# Patient Record
Sex: Female | Born: 1960 | Race: White | Hispanic: No | Marital: Married | Smoking: Never smoker
Health system: Southern US, Community
[De-identification: ages and names within clinical notes are randomized; demographics above are authoritative.]

## PROBLEM LIST (undated history)

## (undated) DIAGNOSIS — Z923 Personal history of irradiation: Secondary | ICD-10-CM

## (undated) DIAGNOSIS — R0602 Shortness of breath: Secondary | ICD-10-CM

## (undated) DIAGNOSIS — F419 Anxiety disorder, unspecified: Secondary | ICD-10-CM

## (undated) DIAGNOSIS — I10 Essential (primary) hypertension: Secondary | ICD-10-CM

## (undated) DIAGNOSIS — F32A Depression, unspecified: Secondary | ICD-10-CM

## (undated) DIAGNOSIS — F329 Major depressive disorder, single episode, unspecified: Secondary | ICD-10-CM

## (undated) DIAGNOSIS — C50919 Malignant neoplasm of unspecified site of unspecified female breast: Secondary | ICD-10-CM

## (undated) DIAGNOSIS — R51 Headache: Secondary | ICD-10-CM

## (undated) DIAGNOSIS — G4730 Sleep apnea, unspecified: Secondary | ICD-10-CM

## (undated) HISTORY — DX: Sleep apnea, unspecified: G47.30

## (undated) HISTORY — DX: Personal history of irradiation: Z92.3

## (undated) HISTORY — DX: Depression, unspecified: F32.A

## (undated) HISTORY — DX: Essential (primary) hypertension: I10

## (undated) HISTORY — DX: Major depressive disorder, single episode, unspecified: F32.9

## (undated) HISTORY — DX: Anxiety disorder, unspecified: F41.9

## (undated) HISTORY — DX: Malignant neoplasm of unspecified site of unspecified female breast: C50.919

## (undated) HISTORY — DX: Headache: R51

---

## 2005-10-26 ENCOUNTER — Ambulatory Visit (HOSPITAL_BASED_OUTPATIENT_CLINIC_OR_DEPARTMENT_OTHER): Admission: RE | Admit: 2005-10-26 | Discharge: 2005-10-26 | Payer: Self-pay

## 2005-10-29 ENCOUNTER — Ambulatory Visit: Payer: Self-pay | Admitting: Internal Medicine

## 2005-12-15 ENCOUNTER — Ambulatory Visit: Payer: Self-pay | Admitting: Internal Medicine

## 2006-01-12 ENCOUNTER — Ambulatory Visit: Payer: Self-pay | Admitting: Internal Medicine

## 2006-06-22 ENCOUNTER — Ambulatory Visit: Payer: Self-pay | Admitting: Internal Medicine

## 2006-08-24 ENCOUNTER — Ambulatory Visit: Payer: Self-pay

## 2008-08-28 ENCOUNTER — Ambulatory Visit: Payer: Self-pay

## 2009-02-21 ENCOUNTER — Emergency Department (HOSPITAL_COMMUNITY): Admission: EM | Admit: 2009-02-21 | Discharge: 2009-02-21 | Payer: Self-pay | Admitting: Emergency Medicine

## 2011-01-26 ENCOUNTER — Telehealth: Payer: Self-pay | Admitting: Internal Medicine

## 2011-02-02 NOTE — Progress Notes (Signed)
Summary: cpap rx-  Phone Note Call from Patient Call back at Work Phone [phone] Call back at [phone]   Caller: Patient Call For: young Summary of Call: Pt states she wants a copy of her rx for her cpap mask pls advise. Initial call taken by: Darletta Moll,  January 26, 2011 12:36 PM  Follow-up for Phone Call        [facility] Vernie Murders  January 26, 2011 2:41 PM  spoke to pt and advsied she was last seen in 2007 so she will need and appt to get new rx for cpap. Pt set to see CY on 03-10-11. Carron Curie CMA  January 27, 2011 12:34 PM

## 2011-03-09 LAB — URINE MICROSCOPIC-ADD ON

## 2011-03-09 LAB — URINALYSIS, ROUTINE W REFLEX MICROSCOPIC
Bilirubin Urine: NEGATIVE
Ketones, ur: NEGATIVE mg/dL
Nitrite: NEGATIVE
Protein, ur: NEGATIVE mg/dL
Urobilinogen, UA: 0.2 mg/dL (ref 0.0–1.0)

## 2011-03-10 ENCOUNTER — Other Ambulatory Visit (HOSPITAL_COMMUNITY): Payer: Self-pay | Admitting: Family Medicine

## 2011-03-10 ENCOUNTER — Ambulatory Visit (HOSPITAL_COMMUNITY)
Admission: RE | Admit: 2011-03-10 | Discharge: 2011-03-10 | Disposition: A | Discharge status: Home / Self Care | Payer: PRIVATE HEALTH INSURANCE | Source: Ambulatory Visit

## 2011-03-10 ENCOUNTER — Institutional Professional Consult (permissible substitution): Payer: Self-pay | Admitting: Internal Medicine

## 2011-03-10 DIAGNOSIS — M25569 Pain in unspecified knee: Secondary | ICD-10-CM | POA: Insufficient documentation

## 2011-03-10 DIAGNOSIS — R52 Pain, unspecified: Secondary | ICD-10-CM

## 2011-03-10 DIAGNOSIS — IMO0002 Reserved for concepts with insufficient information to code with codable children: Secondary | ICD-10-CM | POA: Insufficient documentation

## 2011-03-10 DIAGNOSIS — M1710 Unilateral primary osteoarthritis, unspecified knee: Secondary | ICD-10-CM | POA: Insufficient documentation

## 2011-04-12 ENCOUNTER — Emergency Department (HOSPITAL_COMMUNITY): Payer: PRIVATE HEALTH INSURANCE

## 2011-04-12 ENCOUNTER — Emergency Department (HOSPITAL_COMMUNITY)
Admission: EM | Admit: 2011-04-12 | Discharge: 2011-04-12 | Disposition: A | Discharge status: Home / Self Care | Payer: PRIVATE HEALTH INSURANCE | Attending: Emergency Medicine | Admitting: Emergency Medicine

## 2011-04-12 DIAGNOSIS — M25569 Pain in unspecified knee: Secondary | ICD-10-CM | POA: Insufficient documentation

## 2011-04-12 DIAGNOSIS — F411 Generalized anxiety disorder: Secondary | ICD-10-CM | POA: Insufficient documentation

## 2011-04-12 DIAGNOSIS — IMO0002 Reserved for concepts with insufficient information to code with codable children: Secondary | ICD-10-CM | POA: Insufficient documentation

## 2011-04-12 DIAGNOSIS — F329 Major depressive disorder, single episode, unspecified: Secondary | ICD-10-CM | POA: Insufficient documentation

## 2011-04-12 DIAGNOSIS — F3289 Other specified depressive episodes: Secondary | ICD-10-CM | POA: Insufficient documentation

## 2011-04-12 DIAGNOSIS — Z79899 Other long term (current) drug therapy: Secondary | ICD-10-CM | POA: Insufficient documentation

## 2011-04-12 DIAGNOSIS — M539 Dorsopathy, unspecified: Secondary | ICD-10-CM | POA: Insufficient documentation

## 2011-04-14 NOTE — Assessment & Plan Note (Signed)
[facility]                               PULMONARY OFFICE NOTE   NAME:[name], [name] RITTENHOUSE                         MRN:          [id number]  DATE:06/22/2006                            DOB:          1961-08-27    Pulmonary sleep followup.   PROBLEM:  1.  Obstructive sleep apnea.  2.  Rhinitis.  3.  Exogenous obesity.  4.  Smoking, non-tobacco.  5.  Hypertension.   HISTORY:  Says she is using CPAP regularly at 14 CWP through [facility],  loves the machine with much less daytime sleepiness and no particular  discomforts.  She has got motivation to stick with a diet and has been  losing some weight.  She asked her daughter to step out of the room so that  she could tell me that she still is regularly smoking marijuana and asking  help to come off of that.  I have discussed smoking cessation in general.  I  have offered to see if Chantix would do anything.   MEDICATIONS:  1.  Lexapro 10 mg.  2.  Sular 20 mg  3.  Birth control pills.  4.  Triamterine/HCTZ.  5.  Xanax 0.25 mg t.i.d.  6.  CPAP at 14 CWP.   OBJECTIVE:  VITAL SIGNS:  Weight 215 pounds.  Blood pressure 128/72.  Pulse  regular 71.  Room air saturation 99%.  GENERAL:  Pleasantly alert and appropriate.  HEENT:  Throat clear with some watery sniffing but no post nasal drainage,  no cough and no pharyngeal erythema.  LUNGS:  Lung fields clear to percussion and auscultation.  HEART:  Heart sounds regular without murmur or gallop.  EXTREMITIES:  I find no adenopathy, no edema.   Recorded weight is down to 215 pounds from 242 pounds in February.   IMPRESSION:  1.  Obstructive sleep apnea.  2.  Substance abuse.   PLAN:  1.  Counseling done.  2.  Try Chantix.  3.  Continue weight loss.  4.  We are going to try a pressure change, [facility] to reduce her pressure to      13 CWP because of her weight loss to see how she feels with it.  5.  Schedule return in six months, earlier as needed.                                   Clinton D. Maple Hudson, MD, FCCP, FACP   CDY/MedQ  DD:  06/25/2006  DT:  06/25/2006  Job #:  [id number]   cc:   [name] [name]

## 2011-04-14 NOTE — Procedures (Signed)
NAMEROSEALIE, REACH                  ACCOUNT NO.:  [id number]   MEDICAL RECORD NO.:  [id number]          PATIENT TYPE:  OUT   LOCATION:  SLEEP CENTER                 FACILITY[facility]  [facility]   PHYSICIAN:  Clinton D. Maple Hudson, M.D. DATE OF BIRTH:  03-28-1961   DATE OF STUDY:                              NOCTURNAL POLYSOMNOGRAM   REFERRING PHYSICIAN:  Dr. Loma Sender.   DATE OF STUDY:  October 26, 2005.   INDICATION FOR STUDY:  Hypersomnia with sleep apnea.   EPWORTH SLEEPINESS SCORE:  20/24.   BMI:  43.   WEIGHT:  240 pounds.   SLEEP ARCHITECTURE:  Total sleep time 415 minutes with sleep efficiency 93%.  Stage I 1%, stage II 67%, stages III and IV 16%, REM 16% of total sleep  time. Sleep latency 11 minutes, REM latency 170 minutes, awake after sleep  onset 20 minutes, arousal index increased at 58. No bedtime medication was  reported.   RESPIRATORY DATA:  Split study protocol:  Apnea/hypopnea index (AHI, RDI)  142 obstructive events per hour indicating very severe obstructive sleep  apnea/hypopnea syndrome before C-PAP. This included 142 obstructive apneas,  6 central apneas and 132 hypopneas before C-PAP. Events were not positional.  REM AHI 1.8 per hour. C-PAP was titrated to 14 CWP, AHI  5.1 per hour. A small ComfortGel mask was used with heated humidifier. At 15  CWP, all events were eliminated but she began oral venting and the  technician felt 14 CWP would be sufficient.   OXYGEN DATA:  Loud snoring with oxygen desaturation to a nadir of 73% before  C-PAP. After C-PAP control, snoring was eliminated and oxygen saturation  held 94-98% on room air.   CARDIAC DATA:  Normal sinus rhythm with PVCs.   MOVEMENT/PARASOMNIA:  Some restlessness and frequent position changing but  no significant limb movement or unusual behavior.   IMPRESSION/RECOMMENDATIONS:  1.  Very severe obstructive sleep apnea/hypopnea syndrome, AHI 98.5 per hour      with loud snoring and oxygen desaturation  to 93%.  2.  Successful C-PAP titration to 14 CWP, AHI 5.1 per hour. A small      ComfortGel mask was used with heated humidifier.      Clinton D. Maple Hudson, M.D.  Diplomate, Biomedical engineer of Sleep Medicine  Electronically Signed     CDY/MEDQ  D:  10/29/2005 12:01:22  T:  10/29/2005 23:13:20  Job:  [id number]

## 2011-04-18 ENCOUNTER — Other Ambulatory Visit: Payer: Self-pay | Admitting: Orthopedic Surgery

## 2011-04-18 DIAGNOSIS — M25562 Pain in left knee: Secondary | ICD-10-CM

## 2011-04-19 ENCOUNTER — Ambulatory Visit
Admission: RE | Admit: 2011-04-19 | Discharge: 2011-04-19 | Disposition: A | Discharge status: Home / Self Care | Payer: PRIVATE HEALTH INSURANCE | Source: Ambulatory Visit | Attending: Orthopedic Surgery | Admitting: Orthopedic Surgery

## 2011-04-19 DIAGNOSIS — M25562 Pain in left knee: Secondary | ICD-10-CM

## 2011-09-15 ENCOUNTER — Ambulatory Visit
Admission: RE | Admit: 2011-09-15 | Discharge: 2011-09-15 | Disposition: A | Discharge status: Home / Self Care | Payer: PRIVATE HEALTH INSURANCE | Source: Ambulatory Visit | Attending: Orthopedic Surgery | Admitting: Orthopedic Surgery

## 2011-09-15 ENCOUNTER — Other Ambulatory Visit: Payer: Self-pay | Admitting: Orthopedic Surgery

## 2011-09-15 DIAGNOSIS — M549 Dorsalgia, unspecified: Secondary | ICD-10-CM

## 2011-09-15 MED ORDER — IOHEXOL 180 MG/ML  SOLN
1.0000 mL | Freq: Once | INTRAMUSCULAR | Status: AC | PRN
  Administered 2011-09-15: 1 mL via EPIDURAL

## 2011-09-15 MED ORDER — METHYLPREDNISOLONE ACETATE 40 MG/ML INJ SUSP (RADIOLOG
120.0000 mg | Freq: Once | INTRAMUSCULAR | Status: AC
  Administered 2011-09-15: 120 mg via EPIDURAL

## 2011-10-25 ENCOUNTER — Telehealth: Payer: Self-pay | Admitting: Internal Medicine

## 2011-10-25 NOTE — Telephone Encounter (Signed)
Pt last seen 06/22/2006 and will need an OV. This will be need to be scheduled as a new consult. LMOMTCB x 1 for Missy @ [facility].

## 2011-10-26 NOTE — Telephone Encounter (Signed)
Spoke with Missy and informed her that pt would need ov with Dr young since pt has not been seen since 2007.  Missy stated that she would let pt know and f/u with [place] after pt's visit.

## 2011-11-02 ENCOUNTER — Telehealth: Payer: Self-pay | Admitting: Internal Medicine

## 2011-11-02 NOTE — Telephone Encounter (Signed)
Spoke with pt. She states that she is needing to have sooner appt to re-establish with CDY- currently sched for 11/30/10 and her CPAP has been broken x 3 wks. No appts available before 12/01/11. Can we squeeze her in or maybe send new rx to DME for CPAP machine until she can get in for appt. Please advise, thanks!

## 2011-11-02 NOTE — Telephone Encounter (Signed)
lmomtcb for pt 

## 2011-11-03 NOTE — Telephone Encounter (Signed)
LMTCB and ask for me-can offer 11-15-2011 at 1a15am

## 2011-11-03 NOTE — Telephone Encounter (Signed)
[name] [name]- see what we can offer her.

## 2011-11-06 NOTE — Telephone Encounter (Signed)
I spoke with patient-she is aware of appointment on Friday 11-10-2011 at 1115 am with CY.

## 2011-11-06 NOTE — Telephone Encounter (Signed)
Pt returned [name]'s call & asked to be reached at [phone].  [name] [name]

## 2011-11-10 ENCOUNTER — Ambulatory Visit (INDEPENDENT_AMBULATORY_CARE_PROVIDER_SITE_OTHER): Payer: PRIVATE HEALTH INSURANCE | Admitting: Internal Medicine

## 2011-11-10 ENCOUNTER — Encounter: Payer: Self-pay | Admitting: Internal Medicine

## 2011-11-10 VITALS — BP 120/82 | HR 90 | Ht 62.0 in | Wt 222.6 lb

## 2011-11-10 DIAGNOSIS — G4733 Obstructive sleep apnea (adult) (pediatric): Secondary | ICD-10-CM

## 2011-11-10 NOTE — Progress Notes (Signed)
11/10/11- 50 yoF never smoker coming to reestablish for care of sleep apnea. Last here in 2007, then lost to f/u. PCP Dr Loma Sender NPSG 10/26/2005- severe OSA w/ AHI 98.5/ hr, weight 240 lbs Her CPAP machine failed and she has been without it for 2 months because of which she says she gets hardly any sleep at all. Her CPAP pressure was 13 and comfortable. Mother and daughter both have sleep apnea. Bedtime between 9 and 10 PM, estimated sleep latency 2 minutes, waking anywhere from 2-8 times per night before finally up at 6:30 AM. Weight has gone up 10 pounds since original testing. Medical problems include hypertension. She denies ENT surgery. 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  ROS-see History of present illness Constitutional:   No-   weight loss, night sweats, fevers, chills, fatigue, lassitude. HEENT:   No-  headaches, difficulty swallowing, tooth/dental problems, sore throat,       No-  sneezing, itching, ear ache, nasal congestion, post nasal drip,  CV:  No-   chest pain, orthopnea, PND, swelling in lower extremities, anasarca,                                  dizziness, palpitations Resp: No-   shortness of breath with exertion or at rest.              No-   productive cough,  No non-productive cough,  No- coughing up of blood.              No-   change in color of mucus.  No- wheezing.   Skin: No-   rash or lesions. GI:  No-   heartburn, indigestion, abdominal pain, nausea, vomiting, diarrhea,                 change in bowel habits, loss of appetite GU: MS:  No-   joint pain or swelling.  No- decreased range of motion.  No- back pain. Neuro-     nothing unusual Psych:  No- change in mood or affect. No depression or anxiety.  No memory loss.  OBJ General- Alert, Oriented, Affect-appropriate, Distress- none acute, alert, obese Skin- rash-none, lesions- none, excoriation- none Lymphadenopathy- none Head- atraumatic            Eyes- Gross vision intact, PERRLA,  conjunctivae clear secretions            Ears- Hearing, canals-normal            Nose- Clear, no-Septal dev, mucus, polyps, erosion, perforation             Throat- Mallampati III , mucosa clear , drainage- none, tonsils-not large Neck- flexible , trachea midline, no stridor , thyroid nl, carotid no bruit Chest - symmetrical excursion , unlabored           Heart/CV- RRR , no murmur , no gallop  , no rub, nl s1 s2                           - JVD- none , edema- none, stasis changes- none, varices- none           Lung- clear to P&A, wheeze- none, cough- none , dullness-none, rub- none           Chest wall-  Abd- tender-no, distended-no, bowel sounds-present, HSM- no Br/ Gen/ Rectal- Not done,  not indicated Extrem- cyanosis- none, clubbing, none, atrophy- none, strength- nl Neuro- grossly intact to observation

## 2011-11-10 NOTE — Patient Instructions (Signed)
[facility], humidifier, mask of choice and supplies   Dx OSA

## 2011-11-12 DIAGNOSIS — G4733 Obstructive sleep apnea (adult) (pediatric): Secondary | ICD-10-CM | POA: Insufficient documentation

## 2011-11-12 NOTE — Assessment & Plan Note (Signed)
Compliance and control have been very good with CPAP and 13 CWP seems to have been a good pressure setting. Her old machine has failed and she needs replacement. She is also interested in changing home care companies. Plan-replacement CPAP 13 cwp with mask, humidifier and supplies. Alternative therapies, weight loss, driving safety.

## 2011-12-01 ENCOUNTER — Institutional Professional Consult (permissible substitution): Payer: PRIVATE HEALTH INSURANCE | Admitting: Internal Medicine

## 2012-01-22 ENCOUNTER — Other Ambulatory Visit: Payer: Self-pay | Admitting: Orthopedic Surgery

## 2012-01-22 DIAGNOSIS — M549 Dorsalgia, unspecified: Secondary | ICD-10-CM

## 2012-01-23 ENCOUNTER — Ambulatory Visit
Admission: RE | Admit: 2012-01-23 | Discharge: 2012-01-23 | Disposition: A | Discharge status: Home / Self Care | Payer: PRIVATE HEALTH INSURANCE | Source: Ambulatory Visit | Attending: Orthopedic Surgery | Admitting: Orthopedic Surgery

## 2012-01-23 DIAGNOSIS — M549 Dorsalgia, unspecified: Secondary | ICD-10-CM

## 2012-01-23 NOTE — Progress Notes (Signed)
Cold pack to back post injection. Taking po's well.

## 2012-03-25 ENCOUNTER — Encounter: Payer: Self-pay | Admitting: Internal Medicine

## 2012-06-07 ENCOUNTER — Other Ambulatory Visit: Payer: Self-pay | Admitting: Orthopedic Surgery

## 2012-06-07 DIAGNOSIS — M549 Dorsalgia, unspecified: Secondary | ICD-10-CM

## 2012-06-19 ENCOUNTER — Ambulatory Visit
Admission: RE | Admit: 2012-06-19 | Discharge: 2012-06-19 | Disposition: A | Discharge status: Home / Self Care | Payer: PRIVATE HEALTH INSURANCE | Source: Ambulatory Visit | Attending: Orthopedic Surgery | Admitting: Orthopedic Surgery

## 2012-06-19 VITALS — BP 157/74 | HR 67

## 2012-06-19 DIAGNOSIS — M5126 Other intervertebral disc displacement, lumbar region: Secondary | ICD-10-CM

## 2012-06-19 DIAGNOSIS — M549 Dorsalgia, unspecified: Secondary | ICD-10-CM

## 2012-06-19 MED ORDER — IOHEXOL 180 MG/ML  SOLN
1.0000 mL | Freq: Once | INTRAMUSCULAR | Status: AC | PRN
  Administered 2012-06-19: 1 mL via EPIDURAL

## 2012-06-19 MED ORDER — METHYLPREDNISOLONE ACETATE 40 MG/ML INJ SUSP (RADIOLOG
120.0000 mg | Freq: Once | INTRAMUSCULAR | Status: AC
  Administered 2012-06-19: 120 mg via EPIDURAL

## 2012-08-08 ENCOUNTER — Other Ambulatory Visit: Payer: Self-pay | Admitting: Orthopedic Surgery

## 2012-08-08 DIAGNOSIS — M25561 Pain in right knee: Secondary | ICD-10-CM

## 2012-08-11 ENCOUNTER — Ambulatory Visit
Admission: RE | Admit: 2012-08-11 | Discharge: 2012-08-11 | Disposition: A | Discharge status: Home / Self Care | Payer: PRIVATE HEALTH INSURANCE | Source: Ambulatory Visit | Attending: Orthopedic Surgery | Admitting: Orthopedic Surgery

## 2012-08-11 DIAGNOSIS — M25561 Pain in right knee: Secondary | ICD-10-CM

## 2012-08-23 ENCOUNTER — Encounter (HOSPITAL_COMMUNITY): Payer: Self-pay | Admitting: Family Medicine

## 2012-08-23 ENCOUNTER — Emergency Department (HOSPITAL_COMMUNITY)
Admission: EM | Admit: 2012-08-23 | Discharge: 2012-08-24 | Disposition: A | Discharge status: Home / Self Care | Payer: PRIVATE HEALTH INSURANCE | Attending: Emergency Medicine | Admitting: Emergency Medicine

## 2012-08-23 DIAGNOSIS — R51 Headache: Secondary | ICD-10-CM | POA: Insufficient documentation

## 2012-08-23 DIAGNOSIS — R300 Dysuria: Secondary | ICD-10-CM | POA: Insufficient documentation

## 2012-08-23 DIAGNOSIS — R112 Nausea with vomiting, unspecified: Secondary | ICD-10-CM | POA: Insufficient documentation

## 2012-08-23 DIAGNOSIS — Z79899 Other long term (current) drug therapy: Secondary | ICD-10-CM | POA: Insufficient documentation

## 2012-08-23 DIAGNOSIS — R350 Frequency of micturition: Secondary | ICD-10-CM | POA: Insufficient documentation

## 2012-08-23 DIAGNOSIS — R509 Fever, unspecified: Secondary | ICD-10-CM | POA: Insufficient documentation

## 2012-08-23 DIAGNOSIS — I10 Essential (primary) hypertension: Secondary | ICD-10-CM | POA: Insufficient documentation

## 2012-08-23 DIAGNOSIS — R3915 Urgency of urination: Secondary | ICD-10-CM | POA: Insufficient documentation

## 2012-08-23 DIAGNOSIS — N12 Tubulo-interstitial nephritis, not specified as acute or chronic: Secondary | ICD-10-CM | POA: Insufficient documentation

## 2012-08-23 DIAGNOSIS — R1033 Periumbilical pain: Secondary | ICD-10-CM | POA: Insufficient documentation

## 2012-08-23 DIAGNOSIS — R5381 Other malaise: Secondary | ICD-10-CM | POA: Insufficient documentation

## 2012-08-23 LAB — CBC WITH DIFFERENTIAL/PLATELET
Basophils Absolute: 0 10*3/uL (ref 0.0–0.1)
Eosinophils Absolute: 0.1 10*3/uL (ref 0.0–0.7)
Eosinophils Relative: 1 % (ref 0–5)
MCH: 30.1 pg (ref 26.0–34.0)
MCHC: 34.5 g/dL (ref 30.0–36.0)
MCV: 87.3 fL (ref 78.0–100.0)
Platelets: 406 10*3/uL — ABNORMAL HIGH (ref 150–400)
RDW: 12.6 % (ref 11.5–15.5)

## 2012-08-23 LAB — COMPREHENSIVE METABOLIC PANEL
ALT: 17 U/L (ref 0–35)
AST: 15 U/L (ref 0–37)
Calcium: 9.6 mg/dL (ref 8.4–10.5)
GFR calc Af Amer: 89 mL/min — ABNORMAL LOW (ref >90)
Sodium: 135 mEq/L (ref 135–145)
Total Protein: 7.8 g/dL (ref 6.0–8.3)

## 2012-08-23 LAB — URINALYSIS, ROUTINE W REFLEX MICROSCOPIC
Bilirubin Urine: NEGATIVE
Protein, ur: 100 mg/dL — AB
Urobilinogen, UA: 1 mg/dL (ref 0.0–1.0)

## 2012-08-23 LAB — URINE MICROSCOPIC-ADD ON

## 2012-08-23 MED ORDER — ONDANSETRON HCL 4 MG/2ML IJ SOLN
4.0000 mg | Freq: Once | INTRAMUSCULAR | Status: AC
  Administered 2012-08-23: 4 mg via INTRAVENOUS
  Filled 2012-08-23: qty 2

## 2012-08-23 MED ORDER — MORPHINE SULFATE 4 MG/ML IJ SOLN
4.0000 mg | Freq: Once | INTRAMUSCULAR | Status: AC
  Administered 2012-08-23: 4 mg via INTRAVENOUS
  Filled 2012-08-23: qty 1

## 2012-08-23 MED ORDER — SODIUM CHLORIDE 0.9 % IV BOLUS (SEPSIS)
1000.0000 mL | Freq: Once | INTRAVENOUS | Status: AC
  Administered 2012-08-23: 1000 mL via INTRAVENOUS

## 2012-08-23 MED ORDER — IBUPROFEN 800 MG PO TABS: 800.0000 mg | ORAL_TABLET | Freq: Once | ORAL | Status: DC

## 2012-08-23 MED ORDER — POTASSIUM CHLORIDE CRYS ER 20 MEQ PO TBCR
40.0000 meq | EXTENDED_RELEASE_TABLET | Freq: Once | ORAL | Status: AC
  Administered 2012-08-24: 40 meq via ORAL
  Filled 2012-08-23: qty 2

## 2012-08-23 MED ORDER — ACETAMINOPHEN 325 MG PO TABS
650.0000 mg | ORAL_TABLET | Freq: Once | ORAL | Status: AC
  Administered 2012-08-23: 650 mg via ORAL
  Filled 2012-08-23: qty 2

## 2012-08-23 MED ORDER — POTASSIUM CHLORIDE CRYS ER 20 MEQ PO TBCR
40.0000 meq | EXTENDED_RELEASE_TABLET | Freq: Once | ORAL | Status: AC
  Administered 2012-08-24: 40 meq via ORAL

## 2012-08-23 MED ORDER — ACETAMINOPHEN 325 MG PO TABS
ORAL_TABLET | ORAL | Status: AC
  Filled 2012-08-23: qty 2

## 2012-08-23 MED ORDER — ACETAMINOPHEN 325 MG PO TABS: 650.0000 mg | ORAL_TABLET | Freq: Once | ORAL | Status: DC

## 2012-08-23 NOTE — ED Provider Notes (Signed)
History     CSN: [id number]  Arrival date & time 08/23/12  1459   First MD Initiated Contact with Patient 08/23/12 1926      Chief Complaint  Patient presents with  . Urinary Frequency  . Fever    (Consider location/radiation/quality/duration/timing/severity/associated sxs/prior treatment) Patient is a 51 y.o. female presenting with abdominal pain. The history is provided by the patient.  Abdominal Pain The primary symptoms of the illness include abdominal pain, fever, fatigue, nausea, vomiting and dysuria. The primary symptoms of the illness do not include shortness of breath or diarrhea. Episode onset: 1.5 wks. The onset of the illness was gradual. The problem has been gradually worsening.  The abdominal pain began more than 2 days ago. The pain came on gradually. The abdominal pain has been gradually worsening since its onset. The abdominal pain is located in the periumbilical region.  The fever began 3 to 5 days ago. The fever has been resolved since its onset. The maximum temperature recorded prior to her arrival was 102 to 102.9 F. The temperature was taken by an oral thermometer.  The dysuria began more than 1 week ago. The discomfort is mild. The dysuria is associated with frequency and urgency. The dysuria is not associated with discharge.  Additional symptoms associated with the illness include urgency and frequency.    Past Medical History  Diagnosis Date  . Hypertension   . Sleep apnea     History reviewed. No pertinent past surgical history.  History reviewed. No pertinent family history.  History  Substance Use Topics  . Smoking status: Never Smoker   . Smokeless tobacco: Not on file  . Alcohol Use: No    OB History    Grav Para Term Preterm Abortions TAB SAB Ect Mult Living                  Review of Systems  Constitutional: Positive for fever and fatigue.  Respiratory: Negative for cough and shortness of breath.   Cardiovascular: Negative for chest  pain.  Gastrointestinal: Positive for nausea, vomiting and abdominal pain. Negative for diarrhea.  Genitourinary: Positive for dysuria, urgency and frequency.  Neurological: Positive for headaches. Negative for weakness.  All other systems reviewed and are negative.    Allergies  Codeine; Penicillins; Percocet; and Septra  Home Medications   Current Outpatient Rx  Name Route Sig Dispense Refill  . ALPRAZOLAM 0.25 MG PO TABS Oral Take 1 tablet by mouth Three times a day.    Marland Kitchen ENPRESSE-28 PO TABS  Take as directed    . ESCITALOPRAM OXALATE 10 MG PO TABS Oral Take 1 tablet by mouth daily.    . MELOXICAM 15 MG PO TABS Oral Take 1 tablet by mouth daily.    . TRIAMTERENE-HCTZ 37.5-25 MG PO TABS Oral Take 1 tablet by mouth daily.      BP 129/78  Pulse 76  Temp 100.7 F (38.2 C) (Oral)  Resp 16  SpO2 99%  Physical Exam  Nursing note and vitals reviewed. Constitutional: She is oriented to person, place, and time. She appears well-developed and well-nourished. No distress.  HENT:  Head: Normocephalic and atraumatic.  Eyes: EOM are normal. Pupils are equal, round, and reactive to light.  Neck: Normal range of motion.  Cardiovascular: Normal rate and normal heart sounds.   Pulmonary/Chest: Effort normal and breath sounds normal. No respiratory distress.  Abdominal: Soft. She exhibits no distension. There is tenderness in the right upper quadrant, epigastric area and periumbilical area.  There is no rigidity and no guarding.       Obese   Musculoskeletal: Normal range of motion.  Neurological: She is alert and oriented to person, place, and time.  Skin: Skin is warm and dry.    ED Course  Procedures (including critical care time)  Labs Reviewed  URINALYSIS, ROUTINE W REFLEX MICROSCOPIC - Abnormal; Notable for the following:    Hgb urine dipstick LARGE (*)     Protein, ur 100 (*)     Leukocytes, UA SMALL (*)     All other components within normal limits  CBC WITH DIFFERENTIAL  - Abnormal; Notable for the following:    WBC 21.6 (*)     RBC 3.69 (*)     Hemoglobin 11.1 (*)     HCT 32.2 (*)     Platelets 406 (*)     Neutrophils Relative 84 (*)     Neutro Abs 18.2 (*)     Lymphocytes Relative 10 (*)     Monocytes Absolute 1.2 (*)     All other components within normal limits  COMPREHENSIVE METABOLIC PANEL - Abnormal; Notable for the following:    Potassium 2.8 (*)     Chloride 94 (*)     Glucose, Bld 172 (*)     Albumin 3.2 (*)     GFR calc non Af Amer 77 (*)     GFR calc Af Amer 89 (*)     All other components within normal limits  URINE MICROSCOPIC-ADD ON   Ct Abdomen Pelvis W Contrast  08/24/2012  *RADIOLOGY REPORT*  Clinical Data: Abdominal pain and vomiting, fever  CT ABDOMEN AND PELVIS WITH CONTRAST  Technique:  Multidetector CT imaging of the abdomen and pelvis was performed following the standard protocol during bolus administration of intravenous contrast.  Contrast: OMNIPAQUE IOHEXOL 300 MG/ML  SOLN  Comparison: None.  Findings: Lung bases are clear.  No pericardial fluid.  No focal hepatic lesion.  Gallbladder, pancreas, spleen, adrenal glands, and kidneys are normal.  The stomach, small bowel, appendix, cecum are normal.  Colon and rectosigmoid colon are normal.  Abdominal aorta normal caliber.  No retroperitoneal periportal lymphadenopathy.  No free fluid the pelvis.  Uterus is lobular consistent with leiomyomas.  Ovaries appear normal.  No pelvic lymphadenopathy. Review of  bone windows demonstrates no aggressive osseous lesions. The  On the delayed pyelogram phase imaging, there are regions of low attenuation in the cortex of the right kidney.  IMPRESSION:  1.  Heterogeneous enhancement of the right kidney on delayed imaging.  Recommend correlation with pyelonephritis. 2.  Otherwise no acute abdominal or pelvic findings.  3.  Normal appendix.   Original Report Authenticated By: Genevive Bi, M.D.     1. Pyelonephritis       MDM  7:28 PM  Pt seen and examined. Pt with about 1.5 wks of dysuria now with fever and vomiting. She has peri-umbilical abdominal pain and still has both her appendix and GB. Labs show elevated WBC count but urine not impressive to account for this pain. Will get CT scan to look for intra-abdominal pathology. Feel this is better as patient could have pyelo v appendicitis v GB disease.  CT scan shows pyelo. Pt feeling improved. Will give one dose of rocephin and discharge patient home.        Daleen Bo, MD 08/24/12 Lyda Jester

## 2012-08-23 NOTE — ED Notes (Signed)
Per pt sts back pain, urinary frequency, dysuria and cloudy urine. sts x 1 week. sts also N.V

## 2012-08-23 NOTE — ED Notes (Signed)
Pt c/o elevated temp. abd pain, headache and burning with urination.  St's she thought she had a UTI and would get better drinking water

## 2012-08-23 NOTE — ED Notes (Signed)
Pt resting, update given.

## 2012-08-24 ENCOUNTER — Encounter (HOSPITAL_COMMUNITY): Payer: Self-pay | Admitting: Radiology

## 2012-08-24 ENCOUNTER — Emergency Department (HOSPITAL_COMMUNITY): Payer: PRIVATE HEALTH INSURANCE

## 2012-08-24 MED ORDER — DEXTROSE 5 % IV SOLN
1.0000 g | Freq: Once | INTRAVENOUS | Status: AC
  Administered 2012-08-24: 1 g via INTRAVENOUS
  Filled 2012-08-24: qty 10

## 2012-08-24 MED ORDER — CEFPODOXIME PROXETIL 200 MG PO TABS: 200.0000 mg | ORAL_TABLET | Freq: Two times a day (BID) | ORAL | Status: AC

## 2012-08-24 MED ORDER — POTASSIUM CHLORIDE CRYS ER 20 MEQ PO TBCR
EXTENDED_RELEASE_TABLET | ORAL | Status: AC
  Filled 2012-08-24: qty 2

## 2012-08-24 MED ORDER — IOHEXOL 300 MG/ML  SOLN
100.0000 mL | Freq: Once | INTRAMUSCULAR | Status: AC | PRN
  Administered 2012-08-24: 100 mL via INTRAVENOUS

## 2012-08-24 NOTE — ED Notes (Signed)
Advised CT patient finished oral contrast.

## 2012-08-24 NOTE — ED Notes (Signed)
Patient transported to CT 

## 2012-08-24 NOTE — ED Notes (Signed)
The patient is AOx4 and comfortable with her discharge instructions. 

## 2012-08-24 NOTE — ED Provider Notes (Signed)
  I performed a history and physical examination of [name] [name] and discussed her management with Dr.Yoder.  I agree with the history, physical, assessment, and plan of care, with the following exceptions: None  Given her leukocytosis, and the pain the patient had CT, demonstrating pyelo.  The patient received an initial dose of ABX, and was d/c (per her request).  Elyse Jarvis, MD 08/24/12 0130

## 2012-11-08 ENCOUNTER — Ambulatory Visit: Payer: PRIVATE HEALTH INSURANCE | Admitting: Internal Medicine

## 2013-03-06 ENCOUNTER — Other Ambulatory Visit: Payer: Self-pay

## 2013-03-18 ENCOUNTER — Other Ambulatory Visit: Payer: Self-pay

## 2013-03-18 DIAGNOSIS — Z1231 Encounter for screening mammogram for malignant neoplasm of breast: Secondary | ICD-10-CM

## 2013-03-22 ENCOUNTER — Encounter (HOSPITAL_COMMUNITY): Payer: Self-pay | Admitting: Physical Medicine and Rehabilitation

## 2013-03-22 ENCOUNTER — Emergency Department (HOSPITAL_COMMUNITY)
Admission: EM | Admit: 2013-03-22 | Discharge: 2013-03-22 | Disposition: A | Discharge status: Home / Self Care | Payer: Medicaid Other | Attending: Emergency Medicine | Admitting: Emergency Medicine

## 2013-03-22 DIAGNOSIS — IMO0001 Reserved for inherently not codable concepts without codable children: Secondary | ICD-10-CM | POA: Insufficient documentation

## 2013-03-22 DIAGNOSIS — Z3202 Encounter for pregnancy test, result negative: Secondary | ICD-10-CM | POA: Insufficient documentation

## 2013-03-22 DIAGNOSIS — Z8669 Personal history of other diseases of the nervous system and sense organs: Secondary | ICD-10-CM | POA: Insufficient documentation

## 2013-03-22 DIAGNOSIS — N179 Acute kidney failure, unspecified: Secondary | ICD-10-CM

## 2013-03-22 DIAGNOSIS — N63 Unspecified lump in unspecified breast: Secondary | ICD-10-CM | POA: Insufficient documentation

## 2013-03-22 DIAGNOSIS — R509 Fever, unspecified: Secondary | ICD-10-CM | POA: Insufficient documentation

## 2013-03-22 DIAGNOSIS — Z79899 Other long term (current) drug therapy: Secondary | ICD-10-CM | POA: Insufficient documentation

## 2013-03-22 DIAGNOSIS — I10 Essential (primary) hypertension: Secondary | ICD-10-CM | POA: Insufficient documentation

## 2013-03-22 DIAGNOSIS — N6310 Unspecified lump in the right breast, unspecified quadrant: Secondary | ICD-10-CM

## 2013-03-22 DIAGNOSIS — E669 Obesity, unspecified: Secondary | ICD-10-CM | POA: Insufficient documentation

## 2013-03-22 DIAGNOSIS — Z791 Long term (current) use of non-steroidal anti-inflammatories (NSAID): Secondary | ICD-10-CM | POA: Insufficient documentation

## 2013-03-22 DIAGNOSIS — E876 Hypokalemia: Secondary | ICD-10-CM | POA: Insufficient documentation

## 2013-03-22 DIAGNOSIS — R112 Nausea with vomiting, unspecified: Secondary | ICD-10-CM | POA: Insufficient documentation

## 2013-03-22 DIAGNOSIS — N12 Tubulo-interstitial nephritis, not specified as acute or chronic: Secondary | ICD-10-CM

## 2013-03-22 DIAGNOSIS — A4902 Methicillin resistant Staphylococcus aureus infection, unspecified site: Secondary | ICD-10-CM | POA: Insufficient documentation

## 2013-03-22 DIAGNOSIS — Z880 Allergy status to penicillin: Secondary | ICD-10-CM | POA: Insufficient documentation

## 2013-03-22 DIAGNOSIS — N10 Acute tubulo-interstitial nephritis: Secondary | ICD-10-CM | POA: Insufficient documentation

## 2013-03-22 LAB — COMPREHENSIVE METABOLIC PANEL
ALT: 19 U/L (ref 0–35)
AST: 17 U/L (ref 0–37)
Albumin: 3.5 g/dL (ref 3.5–5.2)
Alkaline Phosphatase: 72 U/L (ref 39–117)
BUN: 20 mg/dL (ref 6–23)
Chloride: 93 mEq/L — ABNORMAL LOW (ref 96–112)
Potassium: 2.9 mEq/L — ABNORMAL LOW (ref 3.5–5.1)
Sodium: 134 mEq/L — ABNORMAL LOW (ref 135–145)
Total Bilirubin: 0.4 mg/dL (ref 0.3–1.2)
Total Protein: 8.5 g/dL — ABNORMAL HIGH (ref 6.0–8.3)

## 2013-03-22 LAB — CBC WITH DIFFERENTIAL/PLATELET
Basophils Relative: 0 % (ref 0–1)
Hemoglobin: 12.9 g/dL (ref 12.0–15.0)
MCHC: 35.2 g/dL (ref 30.0–36.0)
Monocytes Relative: 6 % (ref 3–12)
Neutro Abs: 11.5 10*3/uL — ABNORMAL HIGH (ref 1.7–7.7)
Neutrophils Relative %: 76 % (ref 43–77)
Platelets: 326 10*3/uL (ref 150–400)
RBC: 4.25 MIL/uL (ref 3.87–5.11)

## 2013-03-22 LAB — URINALYSIS, ROUTINE W REFLEX MICROSCOPIC
Glucose, UA: NEGATIVE mg/dL
Ketones, ur: 15 mg/dL — AB
Protein, ur: 30 mg/dL — AB
pH: 6.5 (ref 5.0–8.0)

## 2013-03-22 LAB — URINE MICROSCOPIC-ADD ON

## 2013-03-22 MED ORDER — MORPHINE SULFATE 4 MG/ML IJ SOLN
4.0000 mg | Freq: Once | INTRAMUSCULAR | Status: AC
  Administered 2013-03-22: 4 mg via INTRAVENOUS
  Filled 2013-03-22: qty 1

## 2013-03-22 MED ORDER — PROMETHAZINE HCL 25 MG PO TABS: 25.0000 mg | ORAL_TABLET | Freq: Four times a day (QID) | ORAL | Status: DC | PRN

## 2013-03-22 MED ORDER — POTASSIUM CHLORIDE CRYS ER 20 MEQ PO TBCR
40.0000 meq | EXTENDED_RELEASE_TABLET | Freq: Once | ORAL | Status: AC
  Administered 2013-03-22: 40 meq via ORAL
  Filled 2013-03-22: qty 2

## 2013-03-22 MED ORDER — DEXTROSE 5 % IV SOLN
1.0000 g | INTRAVENOUS | Status: DC
  Administered 2013-03-22: 1 g via INTRAVENOUS
  Filled 2013-03-22: qty 10

## 2013-03-22 MED ORDER — POTASSIUM CHLORIDE CRYS ER 20 MEQ PO TBCR: 20.0000 meq | EXTENDED_RELEASE_TABLET | Freq: Every day | ORAL | Status: DC

## 2013-03-22 MED ORDER — CEPHALEXIN 500 MG PO CAPS: 500.0000 mg | ORAL_CAPSULE | Freq: Four times a day (QID) | ORAL | Status: DC

## 2013-03-22 MED ORDER — TRAMADOL HCL 50 MG PO TABS: 50.0000 mg | ORAL_TABLET | Freq: Four times a day (QID) | ORAL | Status: DC | PRN

## 2013-03-22 MED ORDER — ONDANSETRON HCL 4 MG/2ML IJ SOLN
4.0000 mg | Freq: Once | INTRAMUSCULAR | Status: AC
  Administered 2013-03-22: 4 mg via INTRAVENOUS
  Filled 2013-03-22: qty 2

## 2013-03-22 NOTE — ED Notes (Signed)
Pt tol PO fluids.

## 2013-03-22 NOTE — ED Notes (Signed)
Pt presents to department for evaluation of vomiting and hematuria. Ongoing since Wednesday. Also states dark colored bowel movement on Friday. Denies pain at the time. Pt is alert and oriented x4.

## 2013-03-22 NOTE — ED Provider Notes (Signed)
History     CSN: [id number]  Arrival date & time 03/22/13  1120   First MD Initiated Contact with Patient 03/22/13 1149      Chief Complaint  Patient presents with  . Hematuria  . Emesis    (Consider location/radiation/quality/duration/timing/severity/associated sxs/prior treatment) HPI  [name] [name] is a 52 y.o. female complaining of subjective fever, chills, nausea vomiting and diffuse myalgias with hematuria and no dysuria or frequency worsening over the course of the last 4 days. Patient denies back pain, abdominal pain, chest pain, shortness of breath. Patient states she is also taking doxycycline for MRSA infection to right breast.  PCP: Raliegh Ip  Past Medical History  Diagnosis Date  . Hypertension   . Sleep apnea     No past surgical history on file.  No family history on file.  History  Substance Use Topics  . Smoking status: Never Smoker   . Smokeless tobacco: Not on file  . Alcohol Use: No    OB History   Grav Para Term Preterm Abortions TAB SAB Ect Mult Living                  Review of Systems  Constitutional: Positive for fever and chills.  Respiratory: Negative for shortness of breath.   Cardiovascular: Negative for chest pain.  Gastrointestinal: Positive for nausea and vomiting. Negative for abdominal pain and diarrhea.  Genitourinary: Positive for hematuria. Negative for dysuria, frequency and flank pain.  All other systems reviewed and are negative.    Allergies  Codeine; Penicillins; Percocet; and Septra  Home Medications   Current Outpatient Rx  Name  Route  Sig  Dispense  Refill  . acetaminophen (TYLENOL) 500 MG tablet   Oral   Take 1,000 mg by mouth every 6 (six) hours as needed. For pain         . ALPRAZolam (XANAX) 0.25 MG tablet   Oral   Take 1 tablet by mouth Three times a day.         Marland Kitchen doxycycline (VIBRAMYCIN) 100 MG capsule   Oral   Take 100 mg by mouth 2 (two) times daily. Starting 03/04/13 for 10 days         . escitalopram (LEXAPRO) 10 MG tablet   Oral   Take 1 tablet by mouth daily.         . meloxicam (MOBIC) 15 MG tablet   Oral   Take 1 tablet by mouth daily.         Marland Kitchen triamterene-hydrochlorothiazide (MAXZIDE-25) 37.5-25 MG per tablet   Oral   Take 1 tablet by mouth daily.           BP 130/73  Pulse 81  Temp(Src) 99.4 F (37.4 C) (Oral)  Resp 18  SpO2 99%  Physical Exam  Nursing note and vitals reviewed. Constitutional: She is oriented to person, place, and time. She appears well-developed and well-nourished. No distress.  Obese  HENT:  Head: Normocephalic.  Mouth/Throat: Oropharynx is clear and moist.  Eyes: Conjunctivae and EOM are normal. Pupils are equal, round, and reactive to light.  Neck: Normal range of motion.  Cardiovascular: Normal rate, regular rhythm and intact distal pulses.   Pulmonary/Chest: Effort normal and breath sounds normal. No stridor. No respiratory distress. She has no wheezes. She has no rales. She exhibits no tenderness.  Lump in right breast at approximately 8:00 3 cm, tender, mobile. Right axillary lymphadenopathy. No erythema, induration, warmth, discharge  Abdominal: Soft. Bowel sounds  are normal. She exhibits no distension and no mass. There is no tenderness. There is no rebound and no guarding.  Genitourinary:  No CVA tenderness bilaterally  Musculoskeletal: Normal range of motion.  Neurological: She is alert and oriented to person, place, and time.  Psychiatric: She has a normal mood and affect.    ED Course  Procedures (including critical care time)  Labs Reviewed  CBC WITH DIFFERENTIAL - Abnormal; Notable for the following:    WBC 15.3 (*)    Neutro Abs 11.5 (*)    All other components within normal limits  COMPREHENSIVE METABOLIC PANEL - Abnormal; Notable for the following:    Sodium 134 (*)    Potassium 2.9 (*)    Chloride 93 (*)    Glucose, Bld 115 (*)    Creatinine, Ser 1.17 (*)    Total Protein 8.5 (*)     GFR calc non Af Amer 54 (*)    GFR calc Af Amer 62 (*)    All other components within normal limits  URINALYSIS, ROUTINE W REFLEX MICROSCOPIC - Abnormal; Notable for the following:    APPearance CLOUDY (*)    Hgb urine dipstick LARGE (*)    Bilirubin Urine SMALL (*)    Ketones, ur 15 (*)    Protein, ur 30 (*)    Leukocytes, UA LARGE (*)    All other components within normal limits  URINE CULTURE  URINE MICROSCOPIC-ADD ON  POCT PREGNANCY, URINE   No results found.   1. Pyelonephritis   2. Hypokalemia   3. AKI (acute kidney injury)   4. Breast mass, right       MDM   [name] [name] is a 52 y.o. female hematuria, fever chills, nausea vomiting. This is clinically pyelonephritis. 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 Patient has a strong family history of breast cancer and has never had a mammogram. I will recommend that she presents for a mammogram as soon as possible.  Urinalysis is consistent with infection will give her a gram of Rocephin IV and send her home with Keflex and nausea medication. Patient has a leukocytosis of 15.3 this is likely coming from her UTI. Creatinine is mildly elevated at 1.17 this is likely prerenal secondary to dehydration from nausea and vomiting. Her potassium is also low at 2.9, I will replete this orally with 40 mEq PO.   Discussed case with attending who agrees with plan and stability to d/c to home.    Filed Vitals:   03/22/13 1134  BP: 130/73  Pulse: 81  Temp: 99.4 F (37.4 C)  TempSrc: Oral  Resp: 18  SpO2: 99%     Pt verbalized understanding and agrees with care plan. Outpatient follow-up and return precautions given.    New Prescriptions   CEPHALEXIN (KEFLEX) 500 MG CAPSULE    Take 1 capsule (500 mg total) by mouth 4 (four) times daily.   POTASSIUM CHLORIDE SA (K-DUR,KLOR-CON) 20 MEQ TABLET    Take 1 tablet (20 mEq total) by mouth daily.   PROMETHAZINE (PHENERGAN) 25  MG TABLET    Take 1 tablet (25 mg total) by mouth every 6 (six) hours as needed for nausea.   TRAMADOL (ULTRAM) 50 MG TABLET    Take 1 tablet (50 mg total) by mouth every 6 (six) hours as needed for pain.            Wynetta Emery, PA-C  03/22/13 1429 

## 2013-03-23 NOTE — ED Provider Notes (Signed)
Medical screening examination/treatment/procedure(s) were performed by non-physician practitioner and as supervising physician I was immediately available for consultation/collaboration.    Vida Roller, MD 03/23/13 1102

## 2013-03-24 LAB — URINE CULTURE: Colony Count: >=100000

## 2013-03-25 ENCOUNTER — Other Ambulatory Visit: Payer: Self-pay

## 2013-03-25 ENCOUNTER — Telehealth (HOSPITAL_COMMUNITY): Payer: Self-pay | Admitting: Emergency Medicine

## 2013-03-25 DIAGNOSIS — N6320 Unspecified lump in the left breast, unspecified quadrant: Secondary | ICD-10-CM

## 2013-03-25 NOTE — ED Notes (Signed)
Results received from [facility]. (+) [facility]  Rx given in ED for Keflex -> sensitive to the same.  Chart appended per protocol. 

## 2013-03-27 ENCOUNTER — Ambulatory Visit
Admission: RE | Admit: 2013-03-27 | Discharge: 2013-03-27 | Disposition: A | Discharge status: Home / Self Care | Payer: Medicaid Other | Source: Ambulatory Visit

## 2013-03-27 ENCOUNTER — Other Ambulatory Visit: Payer: Self-pay

## 2013-03-27 DIAGNOSIS — N6320 Unspecified lump in the left breast, unspecified quadrant: Secondary | ICD-10-CM

## 2013-03-27 DIAGNOSIS — R921 Mammographic calcification found on diagnostic imaging of breast: Secondary | ICD-10-CM

## 2013-03-27 DIAGNOSIS — C50919 Malignant neoplasm of unspecified site of unspecified female breast: Secondary | ICD-10-CM | POA: Insufficient documentation

## 2013-03-27 DIAGNOSIS — N63 Unspecified lump in unspecified breast: Secondary | ICD-10-CM

## 2013-03-27 HISTORY — DX: Malignant neoplasm of unspecified site of unspecified female breast: C50.919

## 2013-03-28 ENCOUNTER — Ambulatory Visit
Admission: RE | Admit: 2013-03-28 | Discharge: 2013-03-28 | Disposition: A | Discharge status: Home / Self Care | Payer: Medicaid Other | Source: Ambulatory Visit

## 2013-03-28 ENCOUNTER — Other Ambulatory Visit: Payer: Self-pay

## 2013-03-28 ENCOUNTER — Other Ambulatory Visit (INDEPENDENT_AMBULATORY_CARE_PROVIDER_SITE_OTHER): Payer: Self-pay | Admitting: Surgery

## 2013-03-28 DIAGNOSIS — C50911 Malignant neoplasm of unspecified site of right female breast: Secondary | ICD-10-CM

## 2013-03-28 DIAGNOSIS — R921 Mammographic calcification found on diagnostic imaging of breast: Secondary | ICD-10-CM

## 2013-03-31 ENCOUNTER — Telehealth: Payer: Self-pay | Admitting: *Deleted

## 2013-03-31 DIAGNOSIS — C50411 Malignant neoplasm of upper-outer quadrant of right female breast: Secondary | ICD-10-CM

## 2013-03-31 DIAGNOSIS — C50419 Malignant neoplasm of upper-outer quadrant of unspecified female breast: Secondary | ICD-10-CM | POA: Insufficient documentation

## 2013-03-31 NOTE — Telephone Encounter (Signed)
Confirmed [facility] for 04/02/13 at 0800 .  Instructions and contact information given. 

## 2013-04-01 ENCOUNTER — Ambulatory Visit
Admission: RE | Admit: 2013-04-01 | Discharge: 2013-04-01 | Disposition: A | Discharge status: Home / Self Care | Payer: Medicaid Other | Source: Ambulatory Visit | Attending: Surgery | Admitting: Surgery

## 2013-04-01 DIAGNOSIS — C50911 Malignant neoplasm of unspecified site of right female breast: Secondary | ICD-10-CM

## 2013-04-01 MED ORDER — GADOBENATE DIMEGLUMINE 529 MG/ML IV SOLN
20.0000 mL | Freq: Once | INTRAVENOUS | Status: AC | PRN
  Administered 2013-04-01: 20 mL via INTRAVENOUS

## 2013-04-02 ENCOUNTER — Ambulatory Visit
Admission: RE | Admit: 2013-04-02 | Discharge: 2013-04-02 | Disposition: A | Discharge status: Home / Self Care | Payer: Medicaid Other | Source: Ambulatory Visit | Attending: Radiation Oncology | Admitting: Radiation Oncology

## 2013-04-02 ENCOUNTER — Other Ambulatory Visit (INDEPENDENT_AMBULATORY_CARE_PROVIDER_SITE_OTHER): Payer: Self-pay | Admitting: Surgery

## 2013-04-02 ENCOUNTER — Ambulatory Visit (HOSPITAL_BASED_OUTPATIENT_CLINIC_OR_DEPARTMENT_OTHER): Payer: Medicaid Other | Admitting: Oncology

## 2013-04-02 ENCOUNTER — Encounter: Payer: Self-pay | Admitting: *Deleted

## 2013-04-02 ENCOUNTER — Ambulatory Visit (HOSPITAL_BASED_OUTPATIENT_CLINIC_OR_DEPARTMENT_OTHER): Payer: Medicaid Other | Admitting: Surgery

## 2013-04-02 ENCOUNTER — Encounter (HOSPITAL_COMMUNITY): Payer: Self-pay | Admitting: Pharmacy Technician

## 2013-04-02 ENCOUNTER — Encounter: Payer: Self-pay | Admitting: Oncology

## 2013-04-02 ENCOUNTER — Ambulatory Visit: Payer: Medicaid Other

## 2013-04-02 ENCOUNTER — Ambulatory Visit (HOSPITAL_BASED_OUTPATIENT_CLINIC_OR_DEPARTMENT_OTHER): Payer: Medicaid Other | Admitting: Genetic Counselor

## 2013-04-02 ENCOUNTER — Telehealth: Payer: Self-pay | Admitting: Oncology

## 2013-04-02 ENCOUNTER — Ambulatory Visit: Payer: Medicaid Other | Attending: Surgery | Admitting: Physical Therapy

## 2013-04-02 ENCOUNTER — Other Ambulatory Visit (HOSPITAL_BASED_OUTPATIENT_CLINIC_OR_DEPARTMENT_OTHER): Payer: Medicaid Other | Admitting: Lab

## 2013-04-02 VITALS — BP 112/74 | HR 71 | Temp 98.6°F | Resp 20 | Ht 62.0 in | Wt 235.2 lb

## 2013-04-02 DIAGNOSIS — C50919 Malignant neoplasm of unspecified site of unspecified female breast: Secondary | ICD-10-CM | POA: Insufficient documentation

## 2013-04-02 DIAGNOSIS — N63 Unspecified lump in unspecified breast: Secondary | ICD-10-CM

## 2013-04-02 DIAGNOSIS — R293 Abnormal posture: Secondary | ICD-10-CM | POA: Insufficient documentation

## 2013-04-02 DIAGNOSIS — C50419 Malignant neoplasm of upper-outer quadrant of unspecified female breast: Secondary | ICD-10-CM

## 2013-04-02 DIAGNOSIS — C50411 Malignant neoplasm of upper-outer quadrant of right female breast: Secondary | ICD-10-CM

## 2013-04-02 DIAGNOSIS — Z800 Family history of malignant neoplasm of digestive organs: Secondary | ICD-10-CM

## 2013-04-02 DIAGNOSIS — IMO0001 Reserved for inherently not codable concepts without codable children: Secondary | ICD-10-CM | POA: Insufficient documentation

## 2013-04-02 DIAGNOSIS — C50911 Malignant neoplasm of unspecified site of right female breast: Secondary | ICD-10-CM

## 2013-04-02 LAB — CBC WITH DIFFERENTIAL/PLATELET
Eosinophils Absolute: 0.4 10*3/uL (ref 0.0–0.5)
LYMPH%: 22.6 % (ref 14.0–49.7)
MCH: 29.8 pg (ref 25.1–34.0)
MCV: 91 fL (ref 79.5–101.0)
MONO%: 4 % (ref 0.0–14.0)
NEUT#: 8.3 10*3/uL — ABNORMAL HIGH (ref 1.5–6.5)
Platelets: 372 10*3/uL (ref 145–400)
RBC: 3.79 10*6/uL (ref 3.70–5.45)

## 2013-04-02 LAB — COMPREHENSIVE METABOLIC PANEL (CC13)
Alkaline Phosphatase: 61 U/L (ref 40–150)
BUN: 13.9 mg/dL (ref 7.0–26.0)
Glucose: 153 mg/dl — ABNORMAL HIGH (ref 70–99)
Sodium: 139 mEq/L (ref 136–145)
Total Bilirubin: <0.2 mg/dL (ref 0.20–1.20)
Total Protein: 6.9 g/dL (ref 6.4–8.3)

## 2013-04-02 NOTE — Progress Notes (Signed)
Checked in new pt with no financial concerns. °

## 2013-04-02 NOTE — Progress Notes (Signed)
[facility] Radiation Oncology NEW PATIENT EVALUATION  Name: [name] [name] MRN: [id number]  Date:   04/02/2013           DOB: 06-10-1961  Status: outpatient   CC: Raliegh Ip, MD  Kandis Cocking, MD    REFERRING PHYSICIAN: Kandis Cocking, MD   DIAGNOSIS: Stage IIIA (T3, N1, M0) invasive mammary carcinoma of the right breast   HISTORY OF PRESENT ILLNESS:  [name] [name] is a 52 y.o. female who is seen today at the [facility] with Dr. Darnelle Catalan and Dr. Ezzard Standing for evaluation of her T3 N1 invasive mammary carcinoma of the right breast. She first noted a mass along the upper-outer quadrant of the right breast a few months ago. This was brought to the attention of an emergency room physician when she was recently seen for a urinary tract infection. She was advised to undergo mammography. [facility] showed a 3.5 by 4.0 cm mass with distortion within the upper-outer quadrant of the right breast. There were a few mildly enlarged lower level I right axillary lymph nodes. There is also a 3 x 9 mm cluster of calcifications within the posterior lower right breast felt to be indeterminate. Ultrasound showed a 3.9 x 2.0 x 4.1 cm mass at 10:00 within the right breast, 6-7 cm from the nipple. There were a few mildly enlarged lower level I right axillary lymph nodes that had lost their fatty hilum. She underwent an ultrasound-guided biopsy of the right breast mass on 03/27/2013 along with an abnormal lymph node at 11:00 position of the right breast. These were diagnostic for invasive mammary carcinoma along with DCIS. A stereotactic biopsy of the calcifications at 6:00 was diagnostic for low-grade DCIS. Breast MR on 04/01/2013 showed a 5.4 cm mass within the upper-outer quadrant of the right breast along with a 0.8 cm satellite nodule just inferior and lateral to the dominant mass in the right breast. There was a 2.8 cm focus of non- masslike enhancement in the  posterior third of the right breast corresponding to the biopsy-proven DCIS. There was felt to be pathologic right axillary lymphadenopathy. In addition, there were 2 foci of non- masslike enhancement with a left breast at the 7:00 position, 8 cm from her nipple,  at the 5:00 position, 12 cm from the nipple. She is seen today with Dr. Ovidio Kin and Dr. Jeanette Caprice.  PREVIOUS RADIATION THERAPY: No   PAST MEDICAL HISTORY:  has a past medical history of Hypertension; Sleep apnea; Breast cancer; Anxiety; Depression; and Headache.     PAST SURGICAL HISTORY: No past surgical history on file.   FAMILY HISTORY: family history includes Breast cancer in her maternal grandmother and mother and Prostate cancer in her paternal grandfather. Her maternal grandmother was diagnosed with breast cancer at 95, in her mother diagnosed with breast cancer 58. Her mother is alive and well in her 41s and her maternal grandmother died following a motor vehicle accident at 57.   SOCIAL HISTORY:  reports that she has never smoked. She does not have any smokeless tobacco history on file. She reports that she does not drink alcohol or use illicit drugs. Separated, 75 year old daughter. 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   ALLERGIES: Codeine; Penicillins; Percocet; and Septra   MEDICATIONS:  Current Outpatient Prescriptions  Medication Sig Dispense Refill  . acetaminophen (TYLENOL) 500 MG tablet Take  1,000 mg by mouth every 6 (six) hours as needed. For pain      . ALPRAZolam (XANAX) 0.25 MG tablet Take 1 tablet by mouth Three times a day.      . escitalopram (LEXAPRO) 10 MG tablet Take 1 tablet by mouth daily.      . meloxicam (MOBIC) 15 MG tablet Take 1 tablet by mouth daily.      . potassium chloride SA (K-DUR,KLOR-CON) 20 MEQ tablet Take 1 tablet (20 mEq total) by mouth daily.  3 tablet  0  . promethazine (PHENERGAN) 25 MG tablet Take 1  tablet (25 mg total) by mouth every 6 (six) hours as needed for nausea.  12 tablet  0  . traMADol (ULTRAM) 50 MG tablet Take 1 tablet (50 mg total) by mouth every 6 (six) hours as needed for pain.  15 tablet  0  . triamterene-hydrochlorothiazide (MAXZIDE-25) 37.5-25 MG per tablet Take 1 tablet by mouth daily.       No current facility-administered medications for this encounter.     REVIEW OF SYSTEMS:  Pertinent items are noted in HPI.    PHYSICAL EXAM: Alert and oriented 52 year old white female appearing her stated age. Wt Readings from Last 3 Encounters:  04/02/13 235 lb 3.2 oz (106.686 kg)  11/10/11 222 lb 9.6 oz (100.971 kg)   Temp Readings from Last 3 Encounters:  04/02/13 98.6 F (37 C) Oral  03/22/13 99.4 F (37.4 C) Oral  08/23/12 99.9 F (37.7 C) Oral   BP Readings from Last 3 Encounters:  04/02/13 112/74  04/01/13 126/70  03/22/13 130/73   Pulse Readings from Last 3 Encounters:  04/02/13 71  04/01/13 71  03/22/13 81   Head and neck examination: Grossly unremarkable. Nodes: I believe I can feel a 1.5 cm node within the right axilla. There is no palpable cervical, left axillary, or supraclavicular lymphadenopathy. Chest: Lungs clear. Back: Without spinal or CVA tenderness. Heart: Regular in rhythm. Breasts: There is area of ecchymosis along the upper-outer quadrant of the right breast. There is a palpable 5 cm mass within the upper-outer quadrant. There is some retraction of the overlying skin when she is sitting. Left breast without masses or lesions. Abdomen obese, without masses organomegaly. Extremities: Without edema. Neurologic examination: Grossly nonfocal.    LABORATORY DATA:  Lab Results  Component Value Date   WBC 11.9* 04/02/2013   HGB 11.3* 04/02/2013   HCT 34.5* 04/02/2013   MCV 91.0 04/02/2013   PLT 372 04/02/2013   Lab Results  Component Value Date   NA 139 04/02/2013   K 3.6 04/02/2013   CL 100 04/02/2013   CO2 29 04/02/2013   Lab Results  Component Value  Date   ALT 14 04/02/2013   AST 11 04/02/2013   ALKPHOS 61 04/02/2013   BILITOT <0.20 Repeated and Verified 04/02/2013      IMPRESSION: Stage IIIA (T3, N1, M0) invasive mammary carcinoma of the right breast. She has a concerning family history with 3 generations of breast cancer in a premenopausal age. She met with Dr. Ezzard Standing, and she has decided to proceed with bilateral mastectomies which include a modified radical mastectomy on the right and a simple mastectomy with sentinel lymph node on the left. Based on her clinical stage, I would recommend post mastectomy radiation therapy following adjuvant chemotherapy. Dr. Darnelle Catalan will complete her staging workup following her definitive surgery. She'll meet with the genetic counselor later today. We discussed the potential acute and late toxicities of radiation  therapy which should be given following completion of adjuvant chemotherapy.   PLAN: As discussed above.  I spent 40 minutes minutes face to face with the patient and more than 50% of that time was spent in counseling and/or coordination of care.

## 2013-04-02 NOTE — Progress Notes (Signed)
ID: [name] OB: [id number]  MR#: [id number]  WUJ#:[id number]  PCP: [name] [name] GYN:   SU: [name] [name]: [name] [name]   HISTORY OF PRESENT ILLNESS: [name] [name] tells me she has not been able to have mammography for several years because of cost issues. More recently, she had noted a mass in her right breast but thought this might be MRSA. The urinary tract infection took her to her physician's office, and she brought the breast mass to the attention of [name] Pisciotta NP. She set up the patient for bilateral diagnostic mammography and right breast ultrasound at the [facility] 03/27/2013, which showed a 4 cm irregular mass in the upper outer right breast, with a 9 mm cluster of calcifications in the posterior lower right breast and a few mildly enlarged level I right axillary lymph nodes. The breast mass was palpable and ultrasound showed it to measure 4.1 cm, with some of the level I right axillary lymph nodes noted to have lost their fatty hilum.  Biopsy of both the suspicious areas in the right breast as well as one of the suspicious lymph nodes was performed 03/27/2013 and showed ([id number]) the larger breast mass to consist of an invasive ductal carcinoma with lobular features, grade 2, estrogen and progesterone receptor both 100% positive, with an MIB-1 of 15%, and no HER-2 amplification. 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 The sampled right axillary lymph node was also positive.  Bilateral breast MRI 04/01/2013 showed the larger right breast mass to measure 5.4 cm, with a satellite nodule immediately inferior to it measuring 8 mm. 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 There were multiple enlarged right axillary lymph nodes, the largest measuring 2.8 cm. In addition, in the left breast there were 2 foci on non-masslike enhancement consistent with DCIS  measuring 1.3 cm and 2.2 cm. These weren't different quadrants.  The patient's subsequent history is as noted below  INTERVAL HISTORY: [name] [name] was seen at the multidisciplinary breast cancer clinic may 06/15/2013 accompanied by her mother [name] [name] and her daughter [name] [name].  REVIEW OF SYSTEMS: [name] [name] tolerated the right breast biopsies well. She describes multiple symptoms which are mostly long-standing and include chills, fatigue, hearing loss, mouth and lip sores, chest pain and poor circulation in the right arm, sleeping on more than one pillow, nausea, urinary tract infections, back and joint pain, difficulty walking, headaches, anxiety, depression, hot flashes, and swollen lymph glands.  PAST MEDICAL HISTORY: Past Medical History  Diagnosis Date  . Hypertension   . Sleep apnea   . Breast cancer   . Anxiety   . Depression   . Headache     PAST SURGICAL HISTORY: No past surgical history on file.  FAMILY HISTORY Family History  Problem Relation Age of Onset  . Breast cancer Mother   . Breast cancer Maternal Grandmother   . Prostate cancer Paternal Grandfather    the patient's father died at age 30 in an airplane crash (he was a [name] [name] unsupervised). The patient's mother is alive at age 62. Her name is [name] [name] and she works for [employer]. [name] [name] has a history of breast cancer diagnosed at age 57. She had one sister who is alive and well. [name] [name]'s mother, the patient's grandmother, was diagnosed with breast cancer at age 39. She had 2 sisters, neither with breast cancer. [name] [name] herself had  one brother, no sisters. There is no other history of breast or ovary cancer in the family.  GYNECOLOGIC HISTORY:  Menarche age 52, first live birth age 52. The patient was taking birth control pills until September. She stopped having periods at that time, but had one in late March.  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 Her husband [name] [name] (goes by "[name] [name]") is disabled secondary to diabetes. They are separated, but not divorced, and remain on good terms. [name] [name] lives with her mother [name] [name] and 8 pools. The patient's daughter [name] [name] also lives with the patient.    ADVANCED DIRECTIVES: Not in place. The patient was clear today that she intends to name her daughter [name] [name] as her healthcare power of attorney, bypassing her husband.  HEALTH MAINTENANCE: History  Substance Use Topics  . Smoking status: Never Smoker   . Smokeless tobacco: Not on file  . Alcohol Use: No     Colonoscopy: Never  PAP: Possibly 2006  Bone density: Possibly 2004  Lipid panel:  Allergies  Allergen Reactions  . Codeine Nausea And Vomiting  . Penicillins Rash  . Percocet (Oxycodone-Acetaminophen) Itching  . Septra (Bactrim) Other (See Comments)    Heart races    Current Outpatient Prescriptions  Medication Sig Dispense Refill  . acetaminophen (TYLENOL) 500 MG tablet Take 1,000 mg by mouth every 6 (six) hours as needed. For pain      . ALPRAZolam (XANAX) 0.25 MG tablet Take 1 tablet by mouth Three times a day.      . escitalopram (LEXAPRO) 10 MG tablet Take 1 tablet by mouth daily.      . meloxicam (MOBIC) 15 MG tablet Take 1 tablet by mouth daily.      . promethazine (PHENERGAN) 25 MG tablet Take 1 tablet (25 mg total) by mouth every 6 (six) hours as needed for nausea.  12 tablet  0  . triamterene-hydrochlorothiazide (MAXZIDE-25) 37.5-25 MG per tablet Take 1 tablet by mouth daily.      . potassium chloride SA (K-DUR,KLOR-CON) 20 MEQ tablet Take 1 tablet (20 mEq total) by mouth daily.  3 tablet  0  . traMADol (ULTRAM) 50 MG tablet Take 1 tablet (50 mg total) by mouth every 6 (six) hours as needed for pain.  15 tablet  0   No current facility-administered medications for this visit.    OBJECTIVE: Middle-aged white woman in no acute distress Filed Vitals:   04/02/13 0855  BP: 112/74  Pulse: 71  Temp: 98.6 F (37 C)   Resp: 20     Body mass index is 43.01 kg/(m^2).    ECOG FS: 1  Sclerae unicteric Oropharynx clear No cervical or supraclavicular adenopathy Lungs no rales or rhonchi Heart regular rate and rhythm Abd obese, benign MSK no focal spinal tenderness, no peripheral edema Neuro: nonfocal, well oriented, appropriate affect Breasts: The right breast is status post recent biopsy. There is a mild ecchymosis. This partially obscures the palpable mass in the upper outer quadrant of the right breast. I do not palpate well-defined right axillary adenopathy. There is a right axillary rash, without frank hidradenitis. I do not palpate a mass in the left breast. The left axilla is benign.   LAB RESULTS: No results found for this basename: SPEP, UPEP    Lab Results  Component Value Date   WBC 11.9* 04/02/2013   NEUTROABS 8.3* 04/02/2013   HGB 11.3* 04/02/2013   HCT 34.5* 04/02/2013  MCV 91.0 04/02/2013   PLT 372 04/02/2013      Chemistry      Component Value Date/Time   NA 139 04/02/2013 0844   NA 134* 03/22/2013 1152   K 3.6 04/02/2013 0844   K 2.9* 03/22/2013 1152   CL 100 04/02/2013 0844   CL 93* 03/22/2013 1152   CO2 29 04/02/2013 0844   CO2 29 03/22/2013 1152   BUN 13.9 04/02/2013 0844   BUN 20 03/22/2013 1152   CREATININE 0.9 04/02/2013 0844   CREATININE 1.17* 03/22/2013 1152      Component Value Date/Time   CALCIUM 9.3 04/02/2013 0844   CALCIUM 9.8 03/22/2013 1152   ALKPHOS 61 04/02/2013 0844   ALKPHOS 72 03/22/2013 1152   AST 11 04/02/2013 0844   AST 17 03/22/2013 1152   ALT 14 04/02/2013 0844   ALT 19 03/22/2013 1152   BILITOT <0.20 Repeated and Verified 04/02/2013 0844   BILITOT 0.4 03/22/2013 1152       No results found for this basename: LABCA2    No components found with this basename: [id number]    No results found for this basename: INR,  in the last 168 hours  Urinalysis    Component Value Date/Time   COLORURINE YELLOW 03/22/2013 1212   APPEARANCEUR CLOUDY* 03/22/2013 1212   LABSPEC 1.016  03/22/2013 1212   PHURINE 6.5 03/22/2013 1212   GLUCOSEU NEGATIVE 03/22/2013 1212   HGBUR LARGE* 03/22/2013 1212   BILIRUBINUR SMALL* 03/22/2013 1212   KETONESUR 15* 03/22/2013 1212   PROTEINUR 30* 03/22/2013 1212   UROBILINOGEN 1.0 03/22/2013 1212   NITRITE NEGATIVE 03/22/2013 1212   LEUKOCYTESUR LARGE* 03/22/2013 1212    STUDIES: US Breast Right  03/27/2013  **ADDENDUM** CREATED: 03/27/2013 13:33:25  The Impression should read 3.9 x 2 x 4.1 cm RIGHT BREAST mass.  Addended by:  Elpidio Eric. Si Gaul, M.D. on 03/27/2013 13:33:25.  **END ADDENDUM** SIGNED BY: Tinnie Gens T. Si Gaul, M.D.   03/27/2013  *RADIOLOGY REPORT*  Clinical Data:  52 year old female for annual bilateral mammograms and with palpable right breast mass discovered on self examination.  DIGITAL DIAGNOSTIC BILATERAL MAMMOGRAM WITH CAD AND RIGHT BREAST ULTRASOUND:  Comparison:  12/11/2003  Findings:  ACR Breast Density Category 2: There is a scattered fibroglandular pattern.  A 3.5 x 4 cm irregular mass with distortion within the upper outer right breast is identified.  A few mildly enlarged lower level I right axillary lymph nodes are present. A 3 x 9 mm cluster of slightly heterogeneous calcifications within the posterior lower right breast are indeterminate. There is no evidence of suspicious mass, distortion or worrisome calcifications within the left breast.  Mammographic images were processed with CAD.  On physical exam, a firm palpable mass identified in the upper outer right breast.  Ultrasound is performed, showing a 3.9 x 2 x 4.1 cm irregular hypoechoic mass at the 10 o'clock position of the right breast 6 cm from the nipple. A few mildly enlarged lower level I right axillary/upper outer right breast lymph nodes are present, some which have lost their fatty hilum.  IMPRESSION: 3.9 x 2 x 4.1 cm irregular mass with mildly enlarged lower right axillary/upper outer right breast lymph nodes - highly suspicious for neoplasm with lymphatic spread.  3 x 9 mm cluster  of indeterminate calcifications within the posterior lower right breast.  Tissue sampling of above areas is recommended.  BI-RADS CATEGORY 5:  Highly suggestive of malignancy - appropriate action should be taken.  RECOMMENDATION:  Ultrasound guided  biopsy of the upper outer right breast mass and lymph node.  Stereotactic guided biopsy of right breast calcifications.  These biopsies will be performed today but dictated in a separate report.  I have discussed the findings and recommendations with the patient. Results were also provided in writing at the conclusion of the visit.   Original Report Authenticated By: Harmon Pier, M.D.    Mr Breast Bilateral W Wo Contrast  04/01/2013  *RADIOLOGY REPORT*  Clinical Data: Recent diagnosis of invasive mammary cancer and DCIS in the right breast with metastatic right axillary lymphadenopathy. Family history of breast cancer in her mother at age 33 and maternal grandmother at age 39.  BILATERAL BREAST MRI WITH AND WITHOUT CONTRAST  Technique: Multiplanar, multisequence MR images of both breasts were obtained prior to and following the intravenous administration of 20 ml of Multihance.  Three dimensional images were evaluated at the independent DynaCad workstation.  Comparison:  No prior MRI.  Mammography 03/27/2013, 12/01/2003.  Findings: Large irregular enhancing mass in the upper outer quadrant of the right breast spanning 10-11 o'clock measuring approximately 5.4 x 3.6 x 3.3 cm, corresponding to the recently biopsied mass.  This mass demonstrates both plateau and washout kinetics.  Oval shaped enhancing satellite nodule immediately inferior and lateral to the dominant mass measuring 0.8 cm maximally, also with plateau and washout kinetics.  Approximate 2.8 cm focus of non mass-like enhancement in the posterior third of the breast at the level of the nipple, corresponding to the biopsy- proven DCIS.  No abnormal enhancement elsewhere in the right breast to suggest other foci  of DCIS.  Pathologically enlarged right axillary lymph nodes and right intramammary lymph nodes, including the previously biopsied node. The largest right axillary node measures approximately 2.8 cm in length.  No pathologic right internal mammary, left internal mammary or left axillary lymphadenopathy.  2 foci of non mass-like enhancement in the left breast, similar to the enhancement pattern of the DCIS in the right breast.  An approximate 1.3 cm focus is present in the lower inner quadrant at 7 o'clock approximately 8 cm from the nipple.  An approximate 2.2 cm focus is present in the lower outer quadrant at 5 o'clock approximately 12 cm from the nipple.  There is no correlate on the recent mammogram.  IMPRESSION:  1.  Biopsy-proven invasive mammary cancer in the upper outer quadrant of the right breast with maximum measurement approximating 5.4 cm. 2.  0.8 cm satellite nodule immediately inferior and lateral to the dominant mass in the right breast. 3.  Approximate 2.8 cm focus of non mass-like enhancement in the posterior third of the right breast corresponding to the biopsy- proven DCIS. 4.  Pathologic right axillary lymphadenopathy.  No pathologic lymphadenopathy elsewhere. 5.  2 foci of non mass-like enhancement in the left breast, at the 7 o'clock position approximately 8 cm from the nipple and at the 5 o'clock position approximately 12 cm from the nipple, without mammographic correlate.  RECOMMENDATION: MR biopsy of the 2 foci of non mass-like enhancement in the left breast.  THREE-DIMENSIONAL MR IMAGE RENDERING ON INDEPENDENT WORKSTATION:  Three-dimensional MR images were rendered by post-processing of the original MR data on an independent workstation.  The three- dimensional MR images were interpreted, and findings were reported in the accompanying complete MRI report for this study.  BI-RADS CATEGORY 4:  Suspicious abnormality - biopsy should be considered.   Original Report Authenticated By: Hulan Saas, M.D.    Mm Digital Diagnostic Bilat  03/27/2013  **ADDENDUM** CREATED: 03/27/2013 13:33:25  The Impression should read 3.9 x 2 x 4.1 cm RIGHT BREAST mass.  Addended by:  Elpidio Eric. Si Gaul, M.D. on 03/27/2013 13:33:25.  **END ADDENDUM** SIGNED BY: Tinnie Gens T. Si Gaul, M.D.   03/27/2013  *RADIOLOGY REPORT*  Clinical Data:  52 year old female for annual bilateral mammograms and with palpable right breast mass discovered on self examination.  DIGITAL DIAGNOSTIC BILATERAL MAMMOGRAM WITH CAD AND RIGHT BREAST ULTRASOUND:  Comparison:  12/11/2003  Findings:  ACR Breast Density Category 2: There is a scattered fibroglandular pattern.  A 3.5 x 4 cm irregular mass with distortion within the upper outer right breast is identified.  A few mildly enlarged lower level I right axillary lymph nodes are present. A 3 x 9 mm cluster of slightly heterogeneous calcifications within the posterior lower right breast are indeterminate. There is no evidence of suspicious mass, distortion or worrisome calcifications within the left breast.  Mammographic images were processed with CAD.  On physical exam, a firm palpable mass identified in the upper outer right breast.  Ultrasound is performed, showing a 3.9 x 2 x 4.1 cm irregular hypoechoic mass at the 10 o'clock position of the right breast 6 cm from the nipple. A few mildly enlarged lower level I right axillary/upper outer right breast lymph nodes are present, some which have lost their fatty hilum.  IMPRESSION: 3.9 x 2 x 4.1 cm irregular mass with mildly enlarged lower right axillary/upper outer right breast lymph nodes - highly suspicious for neoplasm with lymphatic spread.  3 x 9 mm cluster of indeterminate calcifications within the posterior lower right breast.  Tissue sampling of above areas is recommended.  BI-RADS CATEGORY 5:  Highly suggestive of malignancy - appropriate action should be taken.  RECOMMENDATION:  Ultrasound guided biopsy of the upper outer right breast mass and lymph  node.  Stereotactic guided biopsy of right breast calcifications.  These biopsies will be performed today but dictated in a separate report.  I have discussed the findings and recommendations with the patient. Results were also provided in writing at the conclusion of the visit.   Original Report Authenticated By: Harmon Pier, M.D.    Mm Digital Diagnostic Unilat R  03/27/2013  *RADIOLOGY REPORT*  Clinical Data:  52 year old female - evaluate clip placement following ultrasound guided right breast biopsy.  DIGITAL DIAGNOSTIC RIGHT MAMMOGRAM  Comparison:  Previous exams.  Findings:  Films are performed following ultrasound guided biopsy of 4 cm irregular mass at the 10 o'clock position of the right breast 6 cm from the nipple.  The T shaped biopsy clip is in satisfactory position.  IMPRESSION: Satisfactory clip position following ultrasound guided right breast biopsy.   Original Report Authenticated By: Harmon Pier, M.D.    Mm Radiologist Eval And Mgmt  03/28/2013  *RADIOLOGY REPORT*  ESTABLISHED PATIENT OFFICE VISIT - LEVEL III [phone])  Chief Complaint: Post biopsy of three sites in the right breast.  History: Patient underwent ultrasound-guided core biopsy of a 4.1 cm mass over the 10 o'clock position of the right breast as well as ultrasound core biopsy of an abnormal lymph node at the 11 o'clock position of the right breast.  The patient also underwent stereotactic core needle biopsy of a group of microcalcifications over the approximate 6 o'clock position of the right breast.  Review of Systems:  Patient complains of minimal soreness at the biopsy sites.  Exam:  Both biopsy sites over the right breast are clean and dry without signs of hematoma  or infection.  Assessment and Plan:  The patient was given the results of her biopsies.  Biopsy of the 4.1 cm mass at the 10 o'clock position is compatible with invasive mammary carcinoma and mammary carcinoma insitu.  Results of the lymph node biopsy at the 10 o'clock  position is compatible with metastatic mammary carcinoma.  The results of the stereotactic biopsy of the microcalcifications at the 6 o'clock position is compatible with low grade DCIS.  These results are concordant with imaging findings.  Patient's immediate questions and concerns were addressed.  The patient was given an appointment for breast MRI 04/01/2013 at 09:30 a.m.  The patient was also given an appointment to the [facility] 04/02/2013.  The patient will be contacted by a representative from the [facility] with more detailed information.   Original Report Authenticated By: Elberta Fortis, M.D.    [place] Rt Breast Bx W Loc Dev 1st Lesion Img Bx Spec US Guide  03/27/2013  *RADIOLOGY REPORT*  Clinical Data:  52 year old female with irregular mass in the upper outer right breast and mildly enlarged right lower axillary/upper outer right breast lymph nodes.  For tissue sampling of the irregular right breast mass.  ULTRASOUND GUIDED VACUUM ASSISTED CORE BIOPSY OF THE RIGHT BREAST  Comparison: Previous exams.  I met with the patient and we discussed the procedure of ultrasound- guided biopsy, including benefits and alternatives.  We discussed the high likelihood of a successful procedure. We discussed the risks of the procedure including infection, bleeding, tissue injury, clip migration, and inadequate sampling.  Informed written consent was given.  Using sterile technique, 2% lidocaine ultrasound guidance and a 12 gauge vacuum assisted needle biopsy was performed of the 3.9 x 2 x 4.1 cm irregular hypoechoic mass at the 10 o'clock position of the right breast 6 cm from the nipple using a lateral approach.  At the conclusion of the procedure, a T shaped tissue marker clip was deployed into the biopsy cavity.  Follow-up 2-view mammogram was performed and dictated separately.  IMPRESSION: Ultrasound-guided biopsy of right breast mass.  No apparent complications.  Pathology will be followed.   Original Report Authenticated By: Harmon Pier, M.D.    [place] Rt Breast Bx W Loc Dev Ea Add Lesion Img Bx Spec US Guide  03/27/2013  *RADIOLOGY REPORT*  Clinical Data:  52 year old female with irregular mass in the upper outer right breast and mildly enlarged right lower axillary/upper outer right breast lymph nodes.  For tissue sampling of one of the enlarged lymph nodes.  ULTRASOUND GUIDED CORE BIOPSY OF THE RIGHT BREAST  Comparison: Previous exams.  I met with the patient and we discussed the procedure of ultrasound- guided biopsy, including benefits and alternatives.  We discussed the high likelihood of a successful procedure. We discussed the risks of the procedure, including infection, bleeding, tissue injury, clip migration, and inadequate sampling.  Informed written consent was given.  Using sterile technique 2% lidocaine, ultrasound guidance and a 14 gauge automated biopsy device, biopsy was performed of one of the enlarged lymph nodes in the lower right axilla/upper outer right breast using a lateral approach.  No immediate complication identified.  IMPRESSION: Ultrasound guided biopsy of enlarged lymph node in the lower right axilla/upper outer right breast.  No apparent complications.  Pathology will be followed.   Original Report Authenticated By: Harmon Pier, M.D.     ASSESSMENT: 52 y.o. 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 There are also 2 suspicious areas in the left breast noted by MRI.  (2) genetics testing pending  PLAN: We spent the better part of today's hour-long visit discussing the biology of breast cancer and the specifics of the patient's  situation. Our feeling is Maribella would do best with bilateral mastectomies given the multiple areas of concern in both breasts and also the fact that for social/economic reasons radiologic followup of this patient (who has not been able to afford mammography for 5 years) might be difficult. This was the patient's own wish as well.  The plan accordingly will be to start with bilateral mastectomies. I am setting her up for staging studies including a PET scan and CT scan of the chest next week. She will also need an echocardiogram. A port is to be placed at the time of her definitive surgery . When she returns to see me we will discuss chemotherapy to consist of docetaxel, cyclophosphamide and doxorubicin. This will be given every 3 weeks x6, to be followed by radiation and then antiestrogen therapy.  Kelci has a good understanding of this plan. She is very eager to get her surgery and studies going and we will do all we can to expedite her treatment. She knows to call for any questions or problems that may develop before her return visit here.  Lowella Dell, [name]   04/02/2013

## 2013-04-02 NOTE — Progress Notes (Signed)
Re:   [name] [name] DOB:   [birth year] MRN:   [id number]  [facility]  ASSESSMENT AND PLAN: 1.  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  She is in the [facility] and understands the multidisciplinary approach to the treatment of breast cancer, which includes medical oncology and radiation oncology.  I discussed the surgical options of lumpectomy vs. mastectomy.  If mastectomy, there is the possibility of reconstruction.   I discussed the options of lymph node biopsy.  The treatment plan depends on the pathologic staging of the tumor and the patient's personal wishes.  The risks of surgery include, but are not limited to, bleeding, infection, the need for further surgery, and nerve injury.  The patient has been given literature on the treatment of breast cancer.  Since she has an advanced right breast cancer, she will need an right MRM.  She is not interested in reconstruction at this time.  Plan: 1) Right MRM, Left simple mastectomy with left axillary SLNBx, power port placement, 2) chemotx, 3) right chest irradiation   1a.  Will need genetics  2.  Left breast lesions - 5 and 7 o'clock  We discussed biopsy of these nodules vs proceeding with mastectomy.  The patient wants to proceed with left mastectomy.  I discussed SLNBx.  3.  Sleep apnea  On CPAP x 8 years 4.  Hypertension 5.  Morbid obesity 6.  Questionable recent history of MRSA 7.  History of urinary tract infections. 8.  History of anxiety  No chief complaint on file.  REFERRING PHYSICIAN: Raliegh Ip, MD  HISTORY OF PRESENT ILLNESS: [name] [name] is a 52 y.o. (DOB: 03/05/1961)  white  female whose primary care physician is  [name] Ip, MD [name] [name]) and comes to me today for right breast cancer.  She has her mother, [name] [name], and her daughter, [name] [name] with her.  The patient has had no prior breast biopsy or breast disease. Her last mammograms were around 2009. She says she cannot for mammograms in the intervening years. She recently had a urinary tract infection and was seen at the [facility] emergency room at the end of April.  They recognized that she had an abnormal right breast suggested that she have mammograms. She said she felt she had a right axillary infection that was being treated for MRSA.  On 03/27/2013 she underwent a mammogram that showed a 4.1 cm irregular lesion at the 10:00 position of the right breast. She also had an abnormality at the 5:00 position of the right breast. 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  She subsequently underwent an MRI of both breast on 04/01/2013 showed the lesions on the right,  It also showed 2 foci of masslike enhancement at the 7:00 position and the 5:00 position in the left breast. Biopsies were suggested for these lesions.  The patient had her last menstrual period September 2013. She stopped birth-control pills around the same time. Recently she had another period.  Her grandmother had  breast cancer. Her mother had breast cancer over 30 years ago. Her mother had bilateral mastectomies with recostruction. I think this has helped the patient in her decisions about treatment.  She has one daughter who is with her today.    Past Medical History  Diagnosis Date  . Hypertension   . Sleep apnea   . Breast cancer   . Anxiety   . Depression   . Headache      No past surgical history on file.    Current Outpatient Prescriptions  Medication Sig Dispense Refill  . acetaminophen  (TYLENOL) 500 MG tablet Take 1,000 mg by mouth every 6 (six) hours as needed. For pain      . ALPRAZolam (XANAX) 0.25 MG tablet Take 1 tablet by mouth Three times a day.      . escitalopram (LEXAPRO) 10 MG tablet Take 1 tablet by mouth daily.      . meloxicam (MOBIC) 15 MG tablet Take 1 tablet by mouth daily.      . potassium chloride SA (K-DUR,KLOR-CON) 20 MEQ tablet Take 1 tablet (20 mEq total) by mouth daily.  3 tablet  0  . promethazine (PHENERGAN) 25 MG tablet Take 1 tablet (25 mg total) by mouth every 6 (six) hours as needed for nausea.  12 tablet  0  . traMADol (ULTRAM) 50 MG tablet Take 1 tablet (50 mg total) by mouth every 6 (six) hours as needed for pain.  15 tablet  0  . triamterene-hydrochlorothiazide (MAXZIDE-25) 37.5-25 MG per tablet Take 1 tablet by mouth daily.       No current facility-administered medications for this visit.      Allergies  Allergen Reactions  . Codeine Nausea And Vomiting  . Penicillins Rash  . Percocet (Oxycodone-Acetaminophen) Itching  . Septra (Bactrim) Other (See Comments)    Heart races    REVIEW OF SYSTEMS: Skin:  No history of rash.  No history of abnormal moles. Infection:  She said that she has been treated for MRSA since Sept of last year, but in part this seems to be self treatment with out bacteriology. Neurologic:  No history of stroke.  No history of seizure.  No history of headaches. Cardiac:  No history of hypertension. No history of heart disease.  No history of prior cardiac catheterization.  No history of seeing a cardiologist. Pulmonary:  Has OSA/CPAP x 8 years.  Sees Dr. Melba [name], whom she last saw about 1 and 1/2 years ago.  Endocrine:  No diabetes. No thyroid disease. Gastrointestinal:  No history of stomach disease.  No history of liver disease.  No history of gall bladder disease.  No history of pancreas disease.  No history of colon disease. Urologic:  Has been to ER twice in last 6 months for "bad" kidney infections (but  not hospitalized). GYN:  One daughter. 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  Has had cortisone shots.  Right knee worse.  Dr. August [name] is ortho. Hematologic:  No bleeding disorder.  No history of anemia.  Not anticoagulated. Psycho-social:  The patient is oriented.   The patient has no obvious psychologic or social impairment to understanding our conversation and plan.  SOCIAL and FAMILY HISTORY: Divorced.  Lives with mother. She has her mother, [name] [name], and her daughter, Eriyana Sweeten with her. Unemployed as of Sept 2013.  Did work as a Sales executive..  PHYSICAL EXAM: There were no vitals taken for this visit.  General: Obese WF who is alert and generally  healthy appearing.  HEENT: Normal. Pupils equal. Neck: Supple. No mass.  No thyroid mass. Lymph Nodes:  No supraclavicular or cervical nodes.  She has a puncture wound in the right axilla. She has not shaved her right axilla.  I'm not sure that I feel the abnormal right axillary lymph node. Lungs: Clear to auscultation and symmetric breath sounds. Heart:  RRR. No murmur or rub. Breast:  Right:  4 cm mass at 9 o'clock which is palpable.  Puncture wound at 5 o'clock for DCIS.  Left:  I do not feel a specific mass. Particular attention paid to the areas of concern on the MRI Abdomen: Soft. No mass. No tenderness. No hernia. Normal bowel sounds.  No abdominal scars. Extremities:  Good strength and ROM  in upper and lower extremities. Neurologic:  Grossly intact to motor and sensory function. Psychiatric: Has normal mood and affect. Behavior is normal.   DATA REVIEWED: Path report to patient.  Ovidio Kin, MD,  [facility] [address] [place]  [address]   [place], [place]    [address] Phone:  [phone] FAX:  [phone]

## 2013-04-02 NOTE — Progress Notes (Signed)
Dr. Vear Clock requested a cancer genetics consultation for [name] [name], a 52 y.o. female, due to a personal and family history of cancer.  [name] [name] presents to clinic today to discuss the possibility of a hereditary predisposition to cancer, genetic testing, and to further clarify her future cancer risks, as well as potential cancer risk for family members.   HISTORY OF PRESENT ILLNESS: [name] [name] was diagnosed with breast cancer at the age of 52. She has two breast primaries in her right breast, one of which is IDC and is triple negative. The other is DCIS. Her planned treatment consists of bilateral mastectomy, chemotherapy and radiation therapy. She has no personal history of other cancer. Both ovaries remain intact.  Past Medical History  Diagnosis Date   Hypertension    Sleep apnea    Breast cancer    Anxiety    Depression    Headache       History   Social History   Marital Status: Single    Spouse Name: N/A    Number of Children: N/A   Years of Education: N/A   Social History Main Topics   Smoking status: Never Smoker    Smokeless tobacco: Not on file   Alcohol Use: No   Drug Use: No   Sexually Active: Not on file   Other Topics Concern   Not on file   Social History Narrative   No narrative on file     FAMILY HISTORY:  During the visit, a 4-generation pedigree was obtained. Significant diagnoses include the following:  Family History  Problem Relation Age of Onset   Breast cancer Mother 42   Breast cancer Maternal Grandmother 58   Prostate cancer Paternal Grandfather 79    [name] [name]'s ancestry is of Caucasian descent. There is no known consanguinity.  GENETIC COUNSELING ASSESSMENT: [name] [name] is a 52 y.o. female with a personal and family history of breast cancer. This history is suggestive of a hereditary predisposition to cancer and we, therefore, discussed and recommended the following at today's visit.   DISCUSSION: We reviewed the  characteristics, features and inheritance patterns of hereditary cancer syndromes. We also discussed genetic testing, including the appropriate family members to test, the process of testing, insurance coverage and turn-around-time for results.    PLAN: [name] [name] currently has Medicaid and thus her genetic testing will not be covered at an outside lab. We discussed BRCA1/2 testing at [facility]; however, based on [name] [name] family history, we recommended her mother [name] [name] MR# [id number]) who was diagnosed with breast cancer at age 64, have genetic counseling and testing first. Her mother has Medicare which will cover genetic testing at an outside laboratory, which is preferable due to the increased sensitivity of mutations at these outside labs compared to [facility] testing. We discussed that if a mutation were to be found in [name] [name]'s mother, we would then be able to do site-specific analysis for [name] [name] at [facility]. [name] [name] mother was present today and agreed to have testing. Once we have [name] [name]'s mother's result, we will then make further recommendations for [name] [name]'s testing.   We encouraged [name] [name] to remain in contact with cancer genetics annually so that we can continuously update the family history and inform her of any changes in cancer genetics and testing that may be of benefit for this family. Ms.  [name] questions were answered to her satisfaction today. Our contact information was provided should additional questions or concerns arise. Thank  you for the referral and allowing [place] to share in the care of your patient.   The patient was seen for a total of 25 minutes, greater than 50% of which was spent face-to-face counseling.  This patient was discussed with Dr. Drue Second and she agrees with the above.    _______________________________________________________________________ For Office Staff:  Number of people involved in session: 4 Was an Intern/ student involved with case: not  applicable

## 2013-04-03 ENCOUNTER — Encounter: Payer: Self-pay | Admitting: *Deleted

## 2013-04-03 ENCOUNTER — Encounter (HOSPITAL_COMMUNITY)
Admission: RE | Admit: 2013-04-03 | Discharge: 2013-04-03 | Disposition: A | Discharge status: Home / Self Care | Payer: Medicaid Other | Source: Ambulatory Visit | Attending: Surgery | Admitting: Surgery

## 2013-04-03 ENCOUNTER — Encounter (HOSPITAL_COMMUNITY): Payer: Self-pay

## 2013-04-03 HISTORY — DX: Shortness of breath: R06.02

## 2013-04-03 LAB — CBC
HCT: 34.2 % — ABNORMAL LOW (ref 36.0–46.0)
Hemoglobin: 11.7 g/dL — ABNORMAL LOW (ref 12.0–15.0)
MCV: 87.9 fL (ref 78.0–100.0)
RBC: 3.89 MIL/uL (ref 3.87–5.11)
RDW: 13 % (ref 11.5–15.5)
WBC: 11.5 10*3/uL — ABNORMAL HIGH (ref 4.0–10.5)

## 2013-04-03 LAB — BASIC METABOLIC PANEL
CO2: 29 mEq/L (ref 19–32)
Chloride: 99 mEq/L (ref 96–112)
Creatinine, Ser: 0.97 mg/dL (ref 0.50–1.10)
GFR calc Af Amer: 78 mL/min — ABNORMAL LOW (ref >90)
Potassium: 3.6 mEq/L (ref 3.5–5.1)
Sodium: 138 mEq/L (ref 135–145)

## 2013-04-03 MED ORDER — CHLORHEXIDINE GLUCONATE 4 % EX LIQD: 1.0000 "application " | Freq: Once | CUTANEOUS | Status: DC

## 2013-04-03 NOTE — Progress Notes (Signed)
[facility] Psychosocial Distress Screening Clinical Social Work  Patient completed distress screening protocol and scored a 8 on the Psychosocial Distress Thermometer which indicates severe distress. Clinical Child psychotherapist met with pt, pt's daughter, and pt's mother in [facility] to assess for distress and other psychosocial needs.  Pt stated that while she was still "nervous" she felt "better" after speaking with the physicians and getting more information on her treatment plan.  Pt also had questions regarding Advance Directives.  CSW reviewed AD packet with pt and family, and completed the Health Care Power of [place] and Health Care Living Will.  Document was notarized and a copy was sent to medical records.  CSW informed pt of the support team and support services at [facility].  Pt was appreciative of the support services.  CSW encouraged pt to call with any questions or concerns.    Tamala Julian, MSW, LCSW Clinical Social Worker [facility] [phone]

## 2013-04-03 NOTE — Pre-Procedure Instructions (Signed)
ROMANA DEATON  04/03/2013   Your procedure is scheduled on:  04/07/13  Report to [facility] at 9 AM.  Call this number if you have problems the morning of surgery: [phone]   Remember:   Do not eat food or drink liquids after midnight.   Take these medicines the morning of surgery with A SIP OF WATER: xanax,keflex,lexapro   Do not wear jewelry, make-up or nail polish.  Do not wear lotions, powders, or perfumes. You may wear deodorant.  Do not shave 48 hours prior to surgery. Men may shave face and neck.  Do not bring valuables to the hospital.  Contacts, dentures or bridgework may not be worn into surgery.  Leave suitcase in the car. After surgery it may be brought to your room.  For patients admitted to the hospital, checkout time is 11:00 AM the day of  discharge.   Patients discharged the day of surgery will not be allowed to drive  home.  Name and phone number of your driver: family  Special Instructions: Shower using CHG 2 nights before surgery and the night before surgery.  If you shower the day of surgery use CHG.  Use special wash - you have one bottle of CHG for all showers.  You should use approximately 1/3 of the bottle for each shower.   Please read over the following fact sheets that you were given: Pain Booklet, Coughing and Deep Breathing, MRSA Information and Surgical Site Infection Prevention

## 2013-04-03 NOTE — Progress Notes (Signed)
PCP is Loma Sender in [place]   # [phone]    Willl call for any cardiac info. And office note.

## 2013-04-03 NOTE — Progress Notes (Signed)
Anesthesia Chart Review:  Patient is a 52 year old female scheduled for right modified radical mastectomy with removal of all right axillary nodes, left simple mastectomy with left axillary sentinel lymph node biopsy, placement of Power Port by Dr. Ezzard Standing on 04/07/2013.  History includes morbid obesity (BMI 43), non-smoker, HTN, depression, anxiety, headaches, OSA with CPAP, SOB.  No past surgeries.  PCP is Dr. Loma Sender in [place], phone [phone] only got a voicemail at his office.  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  I was given her EKG to review after she had already left her PAT appointment.  Patient told our PAT secretary that she has been seeing Dr. Loma Sender for 38 years and has never had any cardiac testing including an EKG.  There are no prior EKGs in Muse or Epic.  No chest pain symptoms were documented at her PAT visit, but she did report chronic SOB.  CXR on 04/03/13 showed no active disease.  Degenerative changes of the thoracic spine.  Preoperative labs noted.  Cr 0.97, glucose 132.  WBC 11.5, H/H 11.7/34.2.  Serum pregnancy is negative.  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 I reviewed above history and EKG with anesthesiologist Dr. Jacklynn Bue.   Plan clinical correlation on the day of surgery by her assigned anesthesiologist.  If no worrisome CV symptoms then would plan to proceed.  Velna Ochs [facility]/Anesthesiology Phone [phone] 04/03/2013 5:04 PM

## 2013-04-04 ENCOUNTER — Encounter (HOSPITAL_COMMUNITY): Payer: Self-pay

## 2013-04-04 ENCOUNTER — Encounter (HOSPITAL_COMMUNITY)
Admission: RE | Admit: 2013-04-04 | Discharge: 2013-04-04 | Disposition: A | Discharge status: Home / Self Care | Payer: Medicaid Other | Source: Ambulatory Visit | Attending: Oncology | Admitting: Oncology

## 2013-04-04 ENCOUNTER — Other Ambulatory Visit: Payer: PRIVATE HEALTH INSURANCE

## 2013-04-04 ENCOUNTER — Ambulatory Visit (HOSPITAL_COMMUNITY)
Admission: RE | Admit: 2013-04-04 | Discharge: 2013-04-04 | Disposition: A | Discharge status: Home / Self Care | Payer: Medicaid Other | Source: Ambulatory Visit | Attending: Oncology | Admitting: Oncology

## 2013-04-04 DIAGNOSIS — C50411 Malignant neoplasm of upper-outer quadrant of right female breast: Secondary | ICD-10-CM

## 2013-04-04 DIAGNOSIS — E278 Other specified disorders of adrenal gland: Secondary | ICD-10-CM | POA: Insufficient documentation

## 2013-04-04 DIAGNOSIS — C50919 Malignant neoplasm of unspecified site of unspecified female breast: Secondary | ICD-10-CM | POA: Insufficient documentation

## 2013-04-04 DIAGNOSIS — N63 Unspecified lump in unspecified breast: Secondary | ICD-10-CM | POA: Insufficient documentation

## 2013-04-04 DIAGNOSIS — R911 Solitary pulmonary nodule: Secondary | ICD-10-CM | POA: Insufficient documentation

## 2013-04-04 DIAGNOSIS — R599 Enlarged lymph nodes, unspecified: Secondary | ICD-10-CM | POA: Insufficient documentation

## 2013-04-04 DIAGNOSIS — M47814 Spondylosis without myelopathy or radiculopathy, thoracic region: Secondary | ICD-10-CM | POA: Insufficient documentation

## 2013-04-04 MED ORDER — IOHEXOL 300 MG/ML  SOLN
80.0000 mL | Freq: Once | INTRAMUSCULAR | Status: AC | PRN
  Administered 2013-04-04: 80 mL via INTRAVENOUS

## 2013-04-04 MED ORDER — FLUDEOXYGLUCOSE F - 18 (FDG) INJECTION
15.1000 | Freq: Once | INTRAVENOUS | Status: AC | PRN
  Administered 2013-04-04: 15.1 via INTRAVENOUS

## 2013-04-06 MED ORDER — DEXTROSE 5 % IV SOLN
3.0000 g | INTRAVENOUS | Status: DC
  Filled 2013-04-06: qty 3000

## 2013-04-07 ENCOUNTER — Encounter (HOSPITAL_COMMUNITY)
Admission: RE | Disposition: A | Discharge status: Home / Self Care | Payer: Self-pay | Source: Ambulatory Visit | Attending: Surgery

## 2013-04-07 ENCOUNTER — Ambulatory Visit (HOSPITAL_COMMUNITY): Payer: Medicaid Other

## 2013-04-07 ENCOUNTER — Observation Stay (HOSPITAL_COMMUNITY)
Admission: RE | Admit: 2013-04-07 | Discharge: 2013-04-09 | Disposition: A | Discharge status: Home / Self Care | Payer: Medicaid Other | Source: Ambulatory Visit | Attending: Surgery | Admitting: Surgery

## 2013-04-07 ENCOUNTER — Ambulatory Visit (HOSPITAL_COMMUNITY): Payer: Medicaid Other | Admitting: Anesthesiology

## 2013-04-07 ENCOUNTER — Telehealth: Payer: Self-pay | Admitting: *Deleted

## 2013-04-07 ENCOUNTER — Encounter (HOSPITAL_COMMUNITY): Payer: Self-pay | Admitting: Vascular Surgery

## 2013-04-07 ENCOUNTER — Ambulatory Visit (HOSPITAL_COMMUNITY)
Admission: RE | Admit: 2013-04-07 | Discharge: 2013-04-07 | Disposition: A | Discharge status: Home / Self Care | Payer: Medicaid Other | Source: Ambulatory Visit | Attending: Surgery | Admitting: Surgery

## 2013-04-07 ENCOUNTER — Observation Stay (HOSPITAL_COMMUNITY): Payer: Medicaid Other

## 2013-04-07 ENCOUNTER — Encounter: Payer: Self-pay | Admitting: *Deleted

## 2013-04-07 ENCOUNTER — Encounter (HOSPITAL_COMMUNITY): Payer: Self-pay | Admitting: Anesthesiology

## 2013-04-07 DIAGNOSIS — Z87440 Personal history of urinary (tract) infections: Secondary | ICD-10-CM | POA: Insufficient documentation

## 2013-04-07 DIAGNOSIS — C50419 Malignant neoplasm of upper-outer quadrant of unspecified female breast: Principal | ICD-10-CM | POA: Insufficient documentation

## 2013-04-07 DIAGNOSIS — N63 Unspecified lump in unspecified breast: Secondary | ICD-10-CM

## 2013-04-07 DIAGNOSIS — G4733 Obstructive sleep apnea (adult) (pediatric): Secondary | ICD-10-CM | POA: Insufficient documentation

## 2013-04-07 DIAGNOSIS — I10 Essential (primary) hypertension: Secondary | ICD-10-CM | POA: Insufficient documentation

## 2013-04-07 DIAGNOSIS — D0590 Unspecified type of carcinoma in situ of unspecified breast: Secondary | ICD-10-CM | POA: Insufficient documentation

## 2013-04-07 DIAGNOSIS — C50411 Malignant neoplasm of upper-outer quadrant of right female breast: Secondary | ICD-10-CM

## 2013-04-07 DIAGNOSIS — C773 Secondary and unspecified malignant neoplasm of axilla and upper limb lymph nodes: Secondary | ICD-10-CM | POA: Insufficient documentation

## 2013-04-07 DIAGNOSIS — Z803 Family history of malignant neoplasm of breast: Secondary | ICD-10-CM | POA: Insufficient documentation

## 2013-04-07 DIAGNOSIS — Z8614 Personal history of Methicillin resistant Staphylococcus aureus infection: Secondary | ICD-10-CM | POA: Insufficient documentation

## 2013-04-07 DIAGNOSIS — C50919 Malignant neoplasm of unspecified site of unspecified female breast: Secondary | ICD-10-CM

## 2013-04-07 DIAGNOSIS — N6089 Other benign mammary dysplasias of unspecified breast: Secondary | ICD-10-CM

## 2013-04-07 HISTORY — PX: SIMPLE MASTECTOMY WITH AXILLARY SENTINEL NODE BIOPSY: SHX6098

## 2013-04-07 HISTORY — PX: PORTACATH PLACEMENT: SHX2246

## 2013-04-07 HISTORY — PX: MASTECTOMY MODIFIED RADICAL: SHX5962

## 2013-04-07 SURGERY — MASTECTOMY, MODIFIED RADICAL
Anesthesia: General | Site: Chest | Laterality: Right | Wound class: Clean

## 2013-04-07 MED ORDER — PROPOFOL 10 MG/ML IV BOLUS
INTRAVENOUS | Status: DC | PRN
  Administered 2013-04-07: 200 mg via INTRAVENOUS

## 2013-04-07 MED ORDER — ONDANSETRON HCL 4 MG/2ML IJ SOLN
4.0000 mg | Freq: Four times a day (QID) | INTRAMUSCULAR | Status: DC | PRN
  Administered 2013-04-08 (×2): 4 mg via INTRAVENOUS
  Filled 2013-04-07 (×2): qty 2

## 2013-04-07 MED ORDER — HYDROMORPHONE HCL PF 1 MG/ML IJ SOLN
INTRAMUSCULAR | Status: AC
  Administered 2013-04-07: 0.5 mg via INTRAVENOUS
  Filled 2013-04-07: qty 1

## 2013-04-07 MED ORDER — ESCITALOPRAM OXALATE 10 MG PO TABS
10.0000 mg | ORAL_TABLET | Freq: Every day | ORAL | Status: DC
  Administered 2013-04-08 – 2013-04-09 (×2): 10 mg via ORAL
  Filled 2013-04-07 (×2): qty 1

## 2013-04-07 MED ORDER — ONDANSETRON HCL 4 MG PO TABS
4.0000 mg | ORAL_TABLET | Freq: Four times a day (QID) | ORAL | Status: DC | PRN
  Administered 2013-04-09: 4 mg via ORAL
  Filled 2013-04-07: qty 1

## 2013-04-07 MED ORDER — LIDOCAINE HCL 4 % MT SOLN
OROMUCOSAL | Status: DC | PRN
  Administered 2013-04-07: 4 mL via TOPICAL

## 2013-04-07 MED ORDER — ARTIFICIAL TEARS OP OINT
TOPICAL_OINTMENT | OPHTHALMIC | Status: DC | PRN
  Administered 2013-04-07: 1 via OPHTHALMIC

## 2013-04-07 MED ORDER — LIDOCAINE HCL (CARDIAC) 20 MG/ML IV SOLN
INTRAVENOUS | Status: DC | PRN
  Administered 2013-04-07: 100 mg via INTRAVENOUS

## 2013-04-07 MED ORDER — SODIUM CHLORIDE 0.9 % IJ SOLN
INTRAMUSCULAR | Status: DC | PRN
  Administered 2013-04-07: 12:00:00 via INTRAMUSCULAR

## 2013-04-07 MED ORDER — HEPARIN SOD (PORK) LOCK FLUSH 100 UNIT/ML IV SOLN
INTRAVENOUS | Status: AC
  Filled 2013-04-07: qty 5

## 2013-04-07 MED ORDER — ONDANSETRON HCL 4 MG/2ML IJ SOLN: 4.0000 mg | Freq: Once | INTRAMUSCULAR | Status: DC | PRN

## 2013-04-07 MED ORDER — CIPROFLOXACIN IN D5W 400 MG/200ML IV SOLN
400.0000 mg | Freq: Once | INTRAVENOUS | Status: AC
  Administered 2013-04-07: 400 mg via INTRAVENOUS
  Filled 2013-04-07: qty 200

## 2013-04-07 MED ORDER — SUCCINYLCHOLINE CHLORIDE 20 MG/ML IJ SOLN
INTRAMUSCULAR | Status: DC | PRN
  Administered 2013-04-07: 120 mg via INTRAVENOUS

## 2013-04-07 MED ORDER — CHLORHEXIDINE GLUCONATE 4 % EX LIQD: 1.0000 "application " | Freq: Once | CUTANEOUS | Status: DC

## 2013-04-07 MED ORDER — HYDROCODONE-ACETAMINOPHEN 5-325 MG PO TABS
1.0000 | ORAL_TABLET | ORAL | Status: DC | PRN
Start: 2013-04-07 — End: 2013-04-09
  Administered 2013-04-08: 1 via ORAL
  Administered 2013-04-08 – 2013-04-09 (×5): 2 via ORAL
  Filled 2013-04-07 (×7): qty 2

## 2013-04-07 MED ORDER — HEPARIN SODIUM (PORCINE) 5000 UNIT/ML IJ SOLN
5000.0000 [IU] | Freq: Three times a day (TID) | INTRAMUSCULAR | Status: DC
  Administered 2013-04-07 – 2013-04-09 (×5): 5000 [IU] via SUBCUTANEOUS
  Filled 2013-04-07 (×7): qty 1

## 2013-04-07 MED ORDER — KCL IN DEXTROSE-NACL 20-5-0.45 MEQ/L-%-% IV SOLN
INTRAVENOUS | Status: AC
  Filled 2013-04-07: qty 1000

## 2013-04-07 MED ORDER — ONDANSETRON HCL 4 MG/2ML IJ SOLN
INTRAMUSCULAR | Status: DC | PRN
  Administered 2013-04-07 (×2): 4 mg via INTRAVENOUS

## 2013-04-07 MED ORDER — TECHNETIUM TC 99M SULFUR COLLOID FILTERED
1.0000 | Freq: Once | INTRAVENOUS | Status: AC | PRN
  Administered 2013-04-07: 1 via INTRADERMAL

## 2013-04-07 MED ORDER — GLYCOPYRROLATE 0.2 MG/ML IJ SOLN
INTRAMUSCULAR | Status: DC | PRN
  Administered 2013-04-07: 0.3 mg via INTRAVENOUS

## 2013-04-07 MED ORDER — SODIUM CHLORIDE 0.9 % IR SOLN
Status: DC | PRN
  Administered 2013-04-07: 16:00:00

## 2013-04-07 MED ORDER — ACETAMINOPHEN 10 MG/ML IV SOLN
INTRAVENOUS | Status: AC
  Filled 2013-04-07: qty 100

## 2013-04-07 MED ORDER — FENTANYL CITRATE 0.05 MG/ML IJ SOLN
INTRAMUSCULAR | Status: DC | PRN
  Administered 2013-04-07 (×15): 50 ug via INTRAVENOUS

## 2013-04-07 MED ORDER — TRIAMTERENE-HCTZ 37.5-25 MG PO TABS
1.0000 | ORAL_TABLET | Freq: Every day | ORAL | Status: DC
  Administered 2013-04-07 – 2013-04-09 (×3): 1 via ORAL
  Filled 2013-04-07 (×3): qty 1

## 2013-04-07 MED ORDER — 0.9 % SODIUM CHLORIDE (POUR BTL) OPTIME
TOPICAL | Status: DC | PRN
  Administered 2013-04-07 (×4): 1000 mL

## 2013-04-07 MED ORDER — HEPARIN SOD (PORK) LOCK FLUSH 100 UNIT/ML IV SOLN
INTRAVENOUS | Status: DC | PRN
  Administered 2013-04-07: 500 [IU] via INTRAVENOUS

## 2013-04-07 MED ORDER — MORPHINE SULFATE 2 MG/ML IJ SOLN
1.0000 mg | INTRAMUSCULAR | Status: DC | PRN
  Administered 2013-04-07 (×2): 2 mg via INTRAVENOUS
  Administered 2013-04-08: 3 mg via INTRAVENOUS
  Administered 2013-04-08: 2 mg via INTRAVENOUS
  Administered 2013-04-08 (×2): 3 mg via INTRAVENOUS
  Filled 2013-04-07: qty 2
  Filled 2013-04-07 (×2): qty 1
  Filled 2013-04-07 (×2): qty 2
  Filled 2013-04-07: qty 1

## 2013-04-07 MED ORDER — ALPRAZOLAM 0.25 MG PO TABS
0.2500 mg | ORAL_TABLET | Freq: Three times a day (TID) | ORAL | Status: DC | PRN
  Administered 2013-04-08 (×3): 0.25 mg via ORAL
  Filled 2013-04-07 (×3): qty 1

## 2013-04-07 MED ORDER — KCL IN DEXTROSE-NACL 20-5-0.45 MEQ/L-%-% IV SOLN
INTRAVENOUS | Status: DC
  Filled 2013-04-07 (×6): qty 1000

## 2013-04-07 MED ORDER — METHYLENE BLUE 1 % INJ SOLN
INTRAMUSCULAR | Status: AC
  Filled 2013-04-07: qty 10

## 2013-04-07 MED ORDER — LIDOCAINE HCL (CARDIAC) 20 MG/ML IV SOLN: INTRAVENOUS | Status: DC | PRN

## 2013-04-07 MED ORDER — LACTATED RINGERS IV SOLN
INTRAVENOUS | Status: DC | PRN
  Administered 2013-04-07 (×3): via INTRAVENOUS

## 2013-04-07 MED ORDER — ACETAMINOPHEN 10 MG/ML IV SOLN
INTRAVENOUS | Status: DC | PRN
  Administered 2013-04-07: 1000 mg via INTRAVENOUS

## 2013-04-07 MED ORDER — ROCURONIUM BROMIDE 100 MG/10ML IV SOLN
INTRAVENOUS | Status: DC | PRN
  Administered 2013-04-07: 30 mg via INTRAVENOUS
  Administered 2013-04-07: 20 mg via INTRAVENOUS

## 2013-04-07 MED ORDER — NEOSTIGMINE METHYLSULFATE 1 MG/ML IJ SOLN
INTRAMUSCULAR | Status: DC | PRN
  Administered 2013-04-07: 2 mg via INTRAVENOUS

## 2013-04-07 MED ORDER — HYDROMORPHONE HCL PF 1 MG/ML IJ SOLN
0.2500 mg | INTRAMUSCULAR | Status: DC | PRN
  Administered 2013-04-07: 0.5 mg via INTRAVENOUS

## 2013-04-07 SURGICAL SUPPLY — 92 items
ADH SKN CLS APL DERMABOND .7 (GAUZE/BANDAGES/DRESSINGS)
APL SKNCLS STERI-STRIP NONHPOA (GAUZE/BANDAGES/DRESSINGS)
APPLIER CLIP 9.375 MED OPEN (MISCELLANEOUS) ×8
APR CLP MED 9.3 20 MLT OPN (MISCELLANEOUS) ×6
ATCH SMKEVC FLXB CAUT HNDSWH (FILTER) ×6 IMPLANT
BAG DECANTER FOR FLEXI CONT (MISCELLANEOUS) ×4 IMPLANT
BANDAGE ELASTIC 6 VELCRO ST LF (GAUZE/BANDAGES/DRESSINGS) ×4 IMPLANT
BENZOIN TINCTURE PRP APPL 2/3 (GAUZE/BANDAGES/DRESSINGS) ×3 IMPLANT
BINDER BREAST LRG (GAUZE/BANDAGES/DRESSINGS) IMPLANT
BINDER BREAST XLRG (GAUZE/BANDAGES/DRESSINGS) ×2 IMPLANT
BLADE SURG 10 STRL SS (BLADE) ×1 IMPLANT
BLADE SURG 15 STRL LF DISP TIS (BLADE) ×3 IMPLANT
BLADE SURG 15 STRL SS (BLADE) ×4
CANISTER SUCTION 2500CC (MISCELLANEOUS) ×4 IMPLANT
CHLORAPREP W/TINT 10.5 ML (MISCELLANEOUS) ×4 IMPLANT
CHLORAPREP W/TINT 26ML (MISCELLANEOUS) ×4 IMPLANT
CLIP APPLIE 9.375 MED OPEN (MISCELLANEOUS) ×3 IMPLANT
CLOTH BEACON ORANGE TIMEOUT ST (SAFETY) ×4 IMPLANT
CONT SPEC 4OZ CLIKSEAL STRL BL (MISCELLANEOUS) ×5 IMPLANT
COVER PROBE W GEL 5X96 (DRAPES) ×4 IMPLANT
COVER SURGICAL LIGHT HANDLE (MISCELLANEOUS) ×5 IMPLANT
CRADLE DONUT ADULT HEAD (MISCELLANEOUS) ×4 IMPLANT
DECANTER SPIKE VIAL GLASS SM (MISCELLANEOUS) ×4 IMPLANT
DERMABOND ADVANCED (GAUZE/BANDAGES/DRESSINGS)
DERMABOND ADVANCED .7 DNX12 (GAUZE/BANDAGES/DRESSINGS) ×3 IMPLANT
DRAIN CHANNEL 19F RND (DRAIN) ×7 IMPLANT
DRAPE C-ARM 42X72 X-RAY (DRAPES) ×5 IMPLANT
DRAPE CHEST BREAST 15X10 FENES (DRAPES) ×4 IMPLANT
DRAPE LAPAROSCOPIC ABDOMINAL (DRAPES) ×4 IMPLANT
DRAPE PROXIMA HALF (DRAPES) ×4 IMPLANT
DRAPE UTILITY 15X26 W/TAPE STR (DRAPE) ×8 IMPLANT
DRSG PAD ABDOMINAL 8X10 ST (GAUZE/BANDAGES/DRESSINGS) ×7 IMPLANT
ELECT CAUTERY BLADE 6.4 (BLADE) ×5 IMPLANT
ELECT REM PT RETURN 9FT ADLT (ELECTROSURGICAL) ×4
ELECTRODE REM PT RTRN 9FT ADLT (ELECTROSURGICAL) ×3 IMPLANT
EVACUATOR SILICONE 100CC (DRAIN) ×7 IMPLANT
EVACUATOR SMOKE ACCUVAC VALLEY (FILTER) ×2
GAUZE SPONGE 4X4 16PLY XRAY LF (GAUZE/BANDAGES/DRESSINGS) ×4 IMPLANT
GLOVE BIO SURGEON STRL SZ 6.5 (GLOVE) ×1 IMPLANT
GLOVE BIOGEL PI IND STRL 7.0 (GLOVE) IMPLANT
GLOVE BIOGEL PI INDICATOR 7.0 (GLOVE) ×2
GLOVE SURG SIGNA 7.5 PF LTX (GLOVE) ×6 IMPLANT
GLOVE SURG SS PI 7.0 STRL IVOR (GLOVE) ×1 IMPLANT
GOWN EXTRA PROTECTION XL (GOWNS) ×1 IMPLANT
GOWN PREVENTION PLUS LG XLONG (DISPOSABLE) ×1 IMPLANT
GOWN PREVENTION PLUS XLARGE (GOWN DISPOSABLE) ×5 IMPLANT
GOWN STRL NON-REIN LRG LVL3 (GOWN DISPOSABLE) ×13 IMPLANT
INTRODUCER 13FR (MISCELLANEOUS) IMPLANT
KIT BASIN OR (CUSTOM PROCEDURE TRAY) ×4 IMPLANT
KIT PORT POWER 8FR ISP CVUE (Catheter) IMPLANT
KIT PORT POWER 9.6FR MRI PREA (Catheter) IMPLANT
KIT PORT POWER ISP 8FR (Catheter) IMPLANT
KIT POWER CATH 8FR (Catheter) ×1 IMPLANT
KIT ROOM TURNOVER OR (KITS) ×4 IMPLANT
NDL 18GX1X1/2 (RX/OR ONLY) (NEEDLE) ×3 IMPLANT
NDL HYPO 25GX1X1/2 BEV (NEEDLE) ×3 IMPLANT
NEEDLE 18GX1X1/2 (RX/OR ONLY) (NEEDLE) ×4 IMPLANT
NEEDLE HYPO 25GX1X1/2 BEV (NEEDLE) ×4 IMPLANT
NS IRRIG 1000ML POUR BTL (IV SOLUTION) ×5 IMPLANT
PACK GENERAL/GYN (CUSTOM PROCEDURE TRAY) ×4 IMPLANT
PACK SURGICAL SETUP 50X90 (CUSTOM PROCEDURE TRAY) ×4 IMPLANT
PAD ARMBOARD 7.5X6 YLW CONV (MISCELLANEOUS) ×8 IMPLANT
PENCIL BUTTON HOLSTER BLD 10FT (ELECTRODE) ×5 IMPLANT
PREFILTER EVAC NS 1 1/3-3/8IN (MISCELLANEOUS) ×4 IMPLANT
SET SHEATH INTRODUCER 10FR (MISCELLANEOUS) IMPLANT
SHEATH COOK PEEL AWAY SET 9F (SHEATH) IMPLANT
SPECIMEN JAR X LARGE (MISCELLANEOUS) ×4 IMPLANT
SPONGE GAUZE 4X4 12PLY (GAUZE/BANDAGES/DRESSINGS) ×5 IMPLANT
SPONGE LAP 18X18 X RAY DECT (DISPOSABLE) ×4 IMPLANT
STAPLER VISISTAT 35W (STAPLE) ×5 IMPLANT
STRIP CLOSURE SKIN 1/2X4 (GAUZE/BANDAGES/DRESSINGS) ×1 IMPLANT
STRIP CLOSURE SKIN 1/4X4 (GAUZE/BANDAGES/DRESSINGS) ×4 IMPLANT
SUT ETHILON 2 0 FS 18 (SUTURE) ×7 IMPLANT
SUT MNCRL AB 4-0 PS2 18 (SUTURE) ×4 IMPLANT
SUT MON AB 5-0 PS2 18 (SUTURE) IMPLANT
SUT SILK 2 0 FS (SUTURE) ×4 IMPLANT
SUT SILK 3 0 (SUTURE) ×4
SUT SILK 3-0 18XBRD TIE 12 (SUTURE) ×3 IMPLANT
SUT VIC AB 3-0 SH 18 (SUTURE) ×7 IMPLANT
SUT VIC AB 3-0 SH 8-18 (SUTURE) ×1 IMPLANT
SUT VIC AB 5-0 PS2 18 (SUTURE) ×5 IMPLANT
SYR 20ML ECCENTRIC (SYRINGE) ×8 IMPLANT
SYR 5ML LUER SLIP (SYRINGE) ×4 IMPLANT
SYR BULB 3OZ (MISCELLANEOUS) ×4 IMPLANT
SYR CONTROL 10ML LL (SYRINGE) ×4 IMPLANT
TAPE CLOTH SURG 6X10 WHT LF (GAUZE/BANDAGES/DRESSINGS) ×1 IMPLANT
TOWEL OR 17X24 6PK STRL BLUE (TOWEL DISPOSABLE) ×4 IMPLANT
TOWEL OR 17X26 10 PK STRL BLUE (TOWEL DISPOSABLE) ×4 IMPLANT
TOWEL OR NON WOVEN STRL DISP B (DISPOSABLE) ×1 IMPLANT
TUBE CONNECTING 12X1/4 (SUCTIONS) ×5 IMPLANT
WATER STERILE IRR 1000ML POUR (IV SOLUTION) IMPLANT
YANKAUER SUCT BULB TIP NO VENT (SUCTIONS) ×1 IMPLANT

## 2013-04-07 NOTE — Anesthesia Procedure Notes (Signed)
Procedure Name: Intubation Date/Time: 04/07/2013 11:22 AM Performed by: Sherie Don Pre-anesthesia Checklist: Patient identified, Emergency Drugs available, Suction available, Patient being monitored and Timeout performed Patient Re-evaluated:Patient Re-evaluated prior to inductionOxygen Delivery Method: Circle system utilized Preoxygenation: Pre-oxygenation with 100% oxygen Intubation Type: IV induction Ventilation: Mask ventilation without difficulty Laryngoscope Size: Mac and 4 Tube type: Oral Tube size: 7.0 mm Airway Equipment and Method: Stylet and LTA kit utilized Placement Confirmation: ETT inserted through vocal cords under direct vision,  positive ETCO2 and breath sounds checked- equal and bilateral Secured at: 22 cm Tube secured with: Tape Dental Injury: Teeth and Oropharynx as per pre-operative assessment

## 2013-04-07 NOTE — Preoperative (Signed)
Beta Blockers   Reason not to administer Beta Blockers:Not Applicable 

## 2013-04-07 NOTE — Anesthesia Postprocedure Evaluation (Signed)
  Anesthesia Post-op Note  Patient: [name] [name]  Procedure(s) Performed: Procedure(s): RIGHT MASTECTOMY MODIFIED RADICAL (Right)  LEFT SMPLE MASTECTOMY WITH AXILLARY SENTINEL NODE BIOPSY (Left) INSERTION PORT-A-CATH (Left)  Patient Location: PACU  Anesthesia Type:General  Level of Consciousness: awake  Airway and Oxygen Therapy: Patient Spontanous Breathing  Post-op Pain: mild  Post-op Assessment: Post-op Vital signs reviewed  Post-op Vital Signs: Reviewed  Complications: No apparent anesthesia complications

## 2013-04-07 NOTE — Progress Notes (Signed)
Patient transferred from PACU to 4U98.  Chest dressings dry and intact, breast binder on.  JP's in place and charged.  No complaints of nausea.  Vital signs stable.  Oriented to room and call light.  Family at bedside.  Will continue to monitor.

## 2013-04-07 NOTE — Progress Notes (Signed)
NOS note  Patient just rolling up to floor.  Awake and reasonably comfortable. Mother and daughter in hall waiting.    BP 137/51  Pulse 73  Temp(Src) 97.5 F (36.4 C) (Oral)  Resp 8  SpO2 95%  Dressings okay.  Ovidio Kin, MD, [facility] Pager: [phone] Office phone:  [phone]

## 2013-04-07 NOTE — Transfer of Care (Signed)
Immediate Anesthesia Transfer of Care Note  Patient: [name] [name]  Procedure(s) Performed: Procedure(s): RIGHT MASTECTOMY MODIFIED RADICAL (Right)  LEFT SMPLE MASTECTOMY WITH AXILLARY SENTINEL NODE BIOPSY (Left) INSERTION PORT-A-CATH (Left)  Patient Location: PACU  Anesthesia Type:General  Level of Consciousness: responds to stimulation  Airway & Oxygen Therapy: Patient Spontanous Breathing and Patient connected to face mask oxygen  Post-op Assessment: Report given to PACU RN and Post -op Vital signs reviewed and stable  Post vital signs: Reviewed and stable  Complications: No apparent anesthesia complications

## 2013-04-07 NOTE — Telephone Encounter (Signed)
Lm gave appt d/t for 04/10/13 @ 2pm for her echo...td

## 2013-04-07 NOTE — Progress Notes (Signed)
Dr. Ezzard Standing called regarding Ancef order and pt's allergy to PCN.  New orders received for Cipro pre-op.

## 2013-04-07 NOTE — Interval H&P Note (Signed)
History and Physical Interval Note:  04/07/2013 11:02 AM  [name] [name]  has presented today for surgery, with the diagnosis of right breast cancer  The various methods of treatment have been discussed with the patient and family.  Mother and daughter are here.  Her PET shows no obvious metastatic disease, so she is ready to go ahead with surgery.  After consideration of risks, benefits and other options for treatment, the patient has consented to  Procedure(s): RIGHT MASTECTOMY MODIFIED RADICAL (Right)  LEFT SMPLE MASTECTOMY WITH AXILLARY SENTINEL NODE BIOPSY (Left) INSERTION PORT-A-CATH (N/A) as a surgical intervention .    The patient's history has been reviewed, patient examined, no change in status, stable for surgery.  I have reviewed the patient's chart and labs.  Questions were answered to the patient's satisfaction.     Destynie Toomey H

## 2013-04-07 NOTE — Telephone Encounter (Signed)
Lm informing the pt that she is scheduled for a chemo edu on 04/29/13 @12  noon. i also made her aware that i am waiting on the ok from her insurance carrier then i will schedule her echo...td

## 2013-04-07 NOTE — Anesthesia Preprocedure Evaluation (Addendum)
Anesthesia Evaluation  Patient identified by MRN, date of birth, ID band Patient awake    Reviewed: Allergy & Precautions, H&P , NPO status , Patient's Chart, lab work & pertinent test results  Airway Mallampati: I TM Distance: >3 FB Neck ROM: full    Dental   Pulmonary sleep apnea ,          Cardiovascular hypertension, Rhythm:regular Rate:Normal     Neuro/Psych  Headaches, Anxiety Depression    GI/Hepatic   Endo/Other    Renal/GU      Musculoskeletal   Abdominal   Peds  Hematology   Anesthesia Other Findings   Reproductive/Obstetrics                          Anesthesia Physical Anesthesia Plan  ASA: II  Anesthesia Plan: General   Post-op Pain Management:    Induction: Intravenous  Airway Management Planned: LMA and Oral ETT  Additional Equipment:   Intra-op Plan:   Post-operative Plan: Extubation in OR  Informed Consent: I have reviewed the patients History and Physical, chart, labs and discussed the procedure including the risks, benefits and alternatives for the proposed anesthesia with the patient or authorized representative who has indicated his/her understanding and acceptance.     Plan Discussed with: CRNA, Anesthesiologist and Surgeon  Anesthesia Plan Comments:         Anesthesia Quick Evaluation

## 2013-04-07 NOTE — H&P (View-Only) (Signed)
 Re:   [name] [name] DOB:   05/29/1961 MRN:   [id number]  [facility]  ASSESSMENT AND PLAN: 1.  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  She is in the [facility] and understands the multidisciplinary approach to the treatment of breast cancer, which includes medical oncology and radiation oncology.  I discussed the surgical options of lumpectomy vs. mastectomy.  If mastectomy, there is the possibility of reconstruction.   I discussed the options of lymph node biopsy.  The treatment plan depends on the pathologic staging of the tumor and the patient's personal wishes.  The risks of surgery include, but are not limited to, bleeding, infection, the need for further surgery, and nerve injury.  The patient has been given literature on the treatment of breast cancer.  Since she has an advanced right breast cancer, she will need an right MRM.  She is not interested in reconstruction at this time.  Plan: 1) Right MRM, Left simple mastectomy with left axillary SLNBx, power port placement, 2) chemotx, 3) right chest irradiation   1a.  Will need genetics  2.  Left breast lesions - 5 and 7 o'clock  We discussed biopsy of these nodules vs proceeding with mastectomy.  The patient wants to proceed with left mastectomy.  I discussed SLNBx.  3.  Sleep apnea  On CPAP x 8 years 4.  Hypertension 5.  Morbid obesity 6.  Questionable recent history of MRSA 7.  History of urinary tract infections. 8.  History of anxiety  No chief complaint on file.  REFERRING PHYSICIAN: PHILLIPS,CHARLES W, MD  HISTORY OF PRESENT ILLNESS: [name] [name] is a 51 y.o. (DOB: 05/08/1961)  white  female whose primary care physician is  PHILLIPS,CHARLES W, MD ([place]) and comes to me today for right breast cancer.  She has her mother, [name] [name], and her daughter, [name] Cando with her.  The patient has had no prior breast biopsy or breast disease. Her last mammograms were around 2009. She says she cannot for mammograms in the intervening years. She recently had a urinary tract infection and was seen at the [facility] emergency room at the end of April.  They recognized that she had an abnormal right breast suggested that she have mammograms. She said she felt she had a right axillary infection that was being treated for MRSA.  On 03/27/2013 she underwent a mammogram that showed a 4.1 cm irregular lesion at the 10:00 position of the right breast. She also had an abnormality at the 5:00 position of the right breast. 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  She subsequently underwent an MRI of both breast on 04/01/2013 showed the lesions on the right,  It also showed 2 foci of masslike enhancement at the 7:00 position and the 5:00 position in the left breast. Biopsies were suggested for these lesions.  The patient had her last menstrual period September 2013. She stopped birth-control pills around the same time. Recently she had another period.  Her grandmother had   breast cancer. Her mother had breast cancer over 30 years ago. Her mother had bilateral mastectomies with recostruction. I think this has helped the patient in her decisions about treatment.  She has one daughter who is with her today.    Past Medical History  Diagnosis Date  . Hypertension   . Sleep apnea   . Breast cancer   . Anxiety   . Depression   . Headache      No past surgical history on file.    Current Outpatient Prescriptions  Medication Sig Dispense Refill  . acetaminophen  (TYLENOL) 500 MG tablet Take 1,000 mg by mouth every 6 (six) hours as needed. For pain      . ALPRAZolam (XANAX) 0.25 MG tablet Take 1 tablet by mouth Three times a day.      . escitalopram (LEXAPRO) 10 MG tablet Take 1 tablet by mouth daily.      . meloxicam (MOBIC) 15 MG tablet Take 1 tablet by mouth daily.      . potassium chloride SA (K-DUR,KLOR-CON) 20 MEQ tablet Take 1 tablet (20 mEq total) by mouth daily.  3 tablet  0  . promethazine (PHENERGAN) 25 MG tablet Take 1 tablet (25 mg total) by mouth every 6 (six) hours as needed for nausea.  12 tablet  0  . traMADol (ULTRAM) 50 MG tablet Take 1 tablet (50 mg total) by mouth every 6 (six) hours as needed for pain.  15 tablet  0  . triamterene-hydrochlorothiazide (MAXZIDE-25) 37.5-25 MG per tablet Take 1 tablet by mouth daily.       No current facility-administered medications for this visit.      Allergies  Allergen Reactions  . Codeine Nausea And Vomiting  . Penicillins Rash  . Percocet (Oxycodone-Acetaminophen) Itching  . Septra (Bactrim) Other (See Comments)    Heart races    REVIEW OF SYSTEMS: Skin:  No history of rash.  No history of abnormal moles. Infection:  She said that she has been treated for MRSA since Sept of last year, but in part this seems to be self treatment with out bacteriology. Neurologic:  No history of stroke.  No history of seizure.  No history of headaches. Cardiac:  No history of hypertension. No history of heart disease.  No history of prior cardiac catheterization.  No history of seeing a cardiologist. Pulmonary:  Has OSA/CPAP x 8 years.  Sees Dr. C. Young, whom she last saw about 1 and 1/2 years ago.  Endocrine:  No diabetes. No thyroid disease. Gastrointestinal:  No history of stomach disease.  No history of liver disease.  No history of gall bladder disease.  No history of pancreas disease.  No history of colon disease. Urologic:  Has been to ER twice in last 6 months for "bad" kidney infections (but  not hospitalized). GYN:  One daughter. 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  Has had cortisone shots.  Right knee worse.  Dr. Dean is ortho. Hematologic:  No bleeding disorder.  No history of anemia.  Not anticoagulated. Psycho-social:  The patient is oriented.   The patient has no obvious psychologic or social impairment to understanding our conversation and plan.  SOCIAL and FAMILY HISTORY: Divorced.  Lives with mother. She has her mother, [name] [name], and her daughter, [name] [name] with her. Unemployed as of Sept 2013.  Did work as a dental assistant..  PHYSICAL EXAM: There were no vitals taken for this visit.  General: Obese WF who is alert and generally   healthy appearing.  HEENT: Normal. Pupils equal. Neck: Supple. No mass.  No thyroid mass. Lymph Nodes:  No supraclavicular or cervical nodes.  She has a puncture wound in the right axilla. She has not shaved her right axilla.  I'm not sure that I feel the abnormal right axillary lymph node. Lungs: Clear to auscultation and symmetric breath sounds. Heart:  RRR. No murmur or rub. Breast:  Right:  4 cm mass at 9 o'clock which is palpable.  Puncture wound at 5 o'clock for DCIS.  Left:  I do not feel a specific mass. Particular attention paid to the areas of concern on the MRI Abdomen: Soft. No mass. No tenderness. No hernia. Normal bowel sounds.  No abdominal scars. Extremities:  Good strength and ROM  in upper and lower extremities. Neurologic:  Grossly intact to motor and sensory function. Psychiatric: Has normal mood and affect. Behavior is normal.   DATA REVIEWED: Path report to patient.  Camya Haydon, MD,  FACS [facility] [address]  [address]   [place], [place]    [address] Phone:  [phone] FAX:  [phone]  

## 2013-04-08 ENCOUNTER — Encounter (HOSPITAL_COMMUNITY): Payer: Self-pay | Admitting: General Practice

## 2013-04-08 HISTORY — PX: MASTECTOMY, RADICAL: SHX710

## 2013-04-08 HISTORY — PX: SIMPLE MASTECTOMY WITH AXILLARY SENTINEL NODE BIOPSY: SHX6098

## 2013-04-08 MED ORDER — DIPHENHYDRAMINE HCL 25 MG PO CAPS
50.0000 mg | ORAL_CAPSULE | Freq: Four times a day (QID) | ORAL | Status: DC | PRN
  Administered 2013-04-08 – 2013-04-09 (×3): 50 mg via ORAL
  Filled 2013-04-08 (×3): qty 2

## 2013-04-08 NOTE — Progress Notes (Signed)
General Surgery Note  LOS: 1 day  POD - 1 Day Post-Op  Assessment/Plan: 1.  Right breast cancer, T3, N1.   RIGHT MASTECTOMY MODIFIED RADICAL,  LEFT SMPLE MASTECTOMY WITH AXILLARY SENTINEL NODE BIOPSY, INSERTION PORT-A-CATH - D. Jezreel Justiniano - 04/07/2013  2.  Morbid obesity 3.  OSA - on CPAP 4.  Hypertension 5.  History of anxiety 6.  DVT prophylaxis - SQ Heparin  Subjective:  Doing well, though has been a little slow to move.  Not ready to go home.  Daughter in room.  I encouraged her to walk and move more. Objective:   Filed Vitals:   04/08/13 1021  BP: 122/54  Pulse: 81  Temp: 98.2 F (36.8 C)  Resp: 17     Intake/Output from previous day:  05/12 0701 - 05/13 0700 In: 2800 [I.V.:2800] Out: 3199 [Urine:2625; Drains:364; Blood:210]  Intake/Output this shift:  Total I/O In: -  Out: 500 [Urine:500]   Physical Exam:   General: WN obese WG who is alert and oriented.    HEENT: Normal. Pupils equal. .   Lungs: Clear.   Wound: In breast binder.  Drains are bloody and serous. Drain #1 (least) - 37 cc, Drain #3 (most) - 170 cc   Lab Results:   No results found for this basename: WBC, HGB, HCT, PLT,  in the last 72 hours  BMET  No results found for this basename: NA, K, CL, CO2, GLUCOSE, BUN, CREATININE, CALCIUM,  in the last 72 hours  PT/INR  No results found for this basename: LABPROT, INR,  in the last 72 hours  ABG  No results found for this basename: PHART, PCO2, PO2, HCO3,  in the last 72 hours   Studies/Results:  Nm Sentinel Node Inj-no Rpt (breast)  04/07/2013  CLINICAL DATA: abnormal nodules on left - to get left mastecotmy - to do  only left axillary sentinel lymph node   Sulfur colloid was injected intradermally by the nuclear medicine  technologist for breast cancer sentinel node localization.     Dg Chest Portable 1 View  04/07/2013  *RADIOLOGY REPORT*  Clinical Data: Port-A-Cath placement.  PORTABLE CHEST - 1 VIEW  Comparison: 04/03/2013  Findings: Left subclavian  Port-A-Cath has been placed.  The tip is in the right atrium approximately 4 cm above the carina.  There are low lung volumes with mild cardiomegaly, vascular congestion and bibasilar atelectasis.  Skin staples noted across the lower chest with probable surgical drains in the anterior soft tissues.  No pneumothorax.  No acute bony abnormality.  IMPRESSION: Left subclavian Port-A-Cath tip in the mid-right atrium. Location of the tip in the right atrium may partially be related to the low lung volumes and semi recumbent nature of the study.  If further evaluation is felt warranted, upright two-view chest x-ray may demonstrate more optimal positioning of the catheter tip near the cavoatrial junction when the patient is recovered and able to take a deeper breath.  No pneumothorax.  Vascular congestion with bibasilar atelectasis and low lung volumes.   Original Report Authenticated By: Charlett Nose, M.D.    Dg Fluoro Guide Cv Line-no Report  04/07/2013  CLINICAL DATA: port-a-cath insertion   FLOURO GUIDE CV LINE  Fluoroscopy was utilized by the requesting physician.  No radiographic  interpretation.       Anti-infectives:   Anti-infectives   Start     Dose/Rate Route Frequency Ordered Stop   04/07/13 1000  ciprofloxacin (CIPRO) IVPB 400 mg    Comments:  On call to O.R.   400 mg 200 mL/hr over 60 Minutes Intravenous  Once 04/07/13 0938 04/07/13 1054   04/07/13 0600  ceFAZolin (ANCEF) 3 g in dextrose 5 % 50 mL IVPB  Status:  Discontinued     3 g 160 mL/hr over 30 Minutes Intravenous On call to O.R. 04/06/13 1539 04/07/13 4098      Ovidio Kin, MD, FACS Pager: [phone],   [facility] Office: [phone] 04/08/2013

## 2013-04-08 NOTE — Op Note (Signed)
[name] [name], [name] [name]                  ACCOUNT NO.:  [id number]  MEDICAL RECORD NO.:  [id number]  LOCATION:  6N21C                        FACILITY:  [facility]  PHYSICIAN:  Sandria Bales. Ezzard Standing, M.D.  DATE OF BIRTH:  [birth year]  DATE OF PROCEDURE:  04/07/2013                              OPERATIVE REPORT  PREOPERATIVE DIAGNOSIS:  T3 N1 right breast cancer, left breast with 2 lesions.  POSTOPERATIVE DIAGNOSIS:  T3 N1 right breast cancer at the 10 o'clock position and 2 lesions in left breast at the 5 and 7 o'clock position, etiology unknown.  PROCEDURE:  Right modified radical mastectomy, left simple mastectomy, injection of methylene blue, left axillary sentinel lymph node biopsy, and left subclavian PowerPort placement.  SURGEON:  Sandria Bales. Ezzard Standing, M.D.  FIRST ASSISTANT:  None.  ANESTHESIA:  General endotracheal.  ESTIMATED BLOOD LOSS:  250 mL.  DRAINS:  Left in before 18-French Blake drains.  COMPLICATIONS:  None.  INDICATION FOR PROCEDURES:  [name] [name] is a 52 year old white female, who sees Dr. Loma [name] in [place] as her primary care doctor. She had not had a mammogram in several years, was found to have a right breast mass.  I biopsied this right breast mass, showed an invasive ductal carcinoma.  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  An MRI revealed this lesion to be 5.6 cm in the right breast.  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  Because of her positive axillary node and multifocal disease, she needed a right modified radical mastectomy.  She had 2 suspicious lesions in the left breast seen on MRI.  The patient was interested in proceeding with bilateral mastectomy.  This seemed appropriate considering her known cancer of the right  breast and abnormalities on the left side.  Because of the advanced nature of the disease, the plan is for chemotherapy, so a PowerPort will be placed at the same time.  The indications and potential risks of surgery were explained to the patient.  Potential risks of the breast surgery includes, but is not limited to, bleeding, infection, nerve injury, and lymphedema.  Also, we talked about the risk including PowerPort in which also includes bleeding, infection, nerve injury, and the possibility of pneumothorax.  OPERATIVE NOTE:  The patient was taken to room #4 at [facility] in the preoperative area.  She had her left nipple injected with 1 millicurie of technetium sulfur colloid for identification of the left axillary sentinel lymph node.  She was placed under general anesthesia. Both breasts were prepped with ChloraPrep.  She had an allergy to penicillin, which led to a rash, so she was given Cipro at the beginning of the case.  A time-out was held and surgical checklist run.  I started the surgery on the left side.  I am going with a left simple mastectomy.  I made bilateral inframammary markings along the sternum. I developed flaps and went medially to the edge of the sternum  superiorly to about 2 or 3 fingerbreadths below the clavicle, inferiorly to the investing fascia of the rectus muscle and laterally to the latissimus dorsi muscle.  I identified a left axillary sentinel lymph node, which had counts of about 2000 was blue with background of less than 30.  This was sent out as the left axis sentinel lymph node.  I then reflected the breast off the pectoralis major muscle.  I put a long suture in the lateral aspect of this and then sent this for pathology.  She had fairly ptotic breast and excess skin, so I excised excess skin both of the superior flap and the inferior flap and these were sent for digital specimens.  A superior flap had a long suture inferior and  a short suture lateral.  Inferior flap had a long suture superior and a short suture lateral.  I placed two 19-French Blake drains to the stab wound in the inferior flap.  I sewed these with 2-0 nylon sutures.  The breast was irrigated. The subcutaneous tissues closed with 3-0 Vicryl suture.  The skin closed with staples.  I thought the skin lay fairly flat despite her weight.  I then turned my attention to the right breast again making elliptical incision including the areola.  I developed breast flap superiorly to 2 or 3 fingerbreadths below the clavicle, inferiorly to the investing fascia of the rectus muscle, medially to the lateral edge of the sternum, and laterally to the latissimus dorsi muscle.  On the right side, I continued my dissection up to the identify the axillary vein. She had __________, I took off with both clips and 3-0 Vicryl ties. Identified the long thoracic nerve of Bell, identified the thoracodorsal nerve and superior __________ axillary contents.  Inferiorly, she had at least 1 or 2 fairly large nodes in the axilla and this was all of the en- bloc resection.  I did find an extra fatty tissue behind her pectoralis minor and level 2 dissection.  Part of this was taken out with the original specimen. __________ the node or two in it, I tried to remove additionally, but this appeared to be fat or lymph nodes is unclear at this time.  This was sent separately.  I then brought out two 19-French Blake drains through inferior stab wounds, sewed in place with 2-0 nylon suture.  The superior flap, again I excised and put a long suture inferiorly and a short suture laterally, and inferior flap also excised taking long suture superiorly, short suture laterally and then closed the wound with interrupted 3-0 Vicryl sutures and the skin with skin staples.  At this time, we broke scrub. The patient was re-prepped and draped, I did cover her wounds with __________ her  breast, which had to be in the operative field to meet up with the PowerPort in.  She had a roll behind her back with her arms tucked.  I went to the left subclavian axillary vein.  I used the Bard __________ [facility].  I accessed the left vein with a 14-gauge needle and inserted a guidewire into the superior vena cava.  This was checked with fluoroscopy.  I then developed a pocket, fairly high because of her mastectomy in the upper aspect of her left chest wall.  I passed silastic tubing from the pocket to the left subclavian stick site and into the left subclavian vein.  I tried to thread the tubing to the junction between the superior vena cava and the right atrium  and images under fluoroscopy, this both aspirated and injected normal saline without difficulty.  I then attached the silastic tubing to the reservoir with the bayonet device and sewed the reservoir in place with 3-0 Vicryl sutures.  The entire unit was first flushed with dilute heparin, then 4 mL of concentrated heparin, which was 100 units/mL.  The subcutaneous tissues were closed with 3-0 Vicryl suture, the skin closed with a 5-0 Vicryl suture, painted with tincture of benzoin and Steri-Strips.  The patient tolerated the procedure well.  She had a pressure dressing placed on both breasts and placed in her breasts wrap.  She was transferred to the recovery room in good condition.  Sponge and needle count were correct at the end of the case.  She will be kept at least for overnight observation.     Sandria Bales. Ezzard Standing, M.D.     DHN/MEDQ  D:  04/07/2013  T:  04/08/2013  Job:  [id number]  cc:   Lowella Dell, M.D. Maryln Gottron, M.D. [name] Sender, MD Clinton D. Maple Hudson, MD, FCCP, FACP

## 2013-04-09 NOTE — Discharge Summary (Signed)
Physician Discharge Summary  Patient ID:  [name] [name]  MRN: [id number]  DOB/AGE: [birth year] 52 y.o.  Admit date: 04/07/2013 Discharge date: 04/09/2013  Discharge Diagnoses:  1. Right breast cancer, T3, N1.   Final pathology pending 2. Morbid obesity  3. OSA - on CPAP  4. Hypertension  5. History of anxiety   Operation: Procedure(s): RIGHT MASTECTOMY MODIFIED RADICAL,  LEFT SMPLE MASTECTOMY WITH AXILLARY SENTINEL NODE BIOPSY, INSERTION PORT-A-CATH on 04/07/2013 - Dr. Algis Downs. [facility]  Discharged Condition: good  Hospital Course: [name] [name] is an 52 y.o. female whose primary care physician is Raliegh Ip, MD and who was admitted 04/07/2013 with a chief complaint of right breast cancer.   She was brought to the operating room on 04/07/2013 and underwent  RIGHT MASTECTOMY MODIFIED RADICAL,  LEFT SMPLE MASTECTOMY WITH AXILLARY SENTINEL NODE BIOPSY, INSERTION PORT-A-CATH.   Post op she has been sore, particularly her left chest and shoulder.  She has been slow to move.  But I think that she is doing well enough to go home today.  Her daughter is at the bedside and is her care giver.  The discharge instructions were reviewed with the patient.  Consults: None  Significant Diagnostic Studies: Results for orders placed during the hospital encounter of 04/04/13  GLUCOSE, CAPILLARY      Result Value Range   Glucose-Capillary 96  70 - 99 mg/dL    Dg Chest 2 View  4/0/9811   *RADIOLOGY REPORT*  Clinical Data: Preop for bilateral mastectomy  CHEST - 2 VIEW  Comparison: None.  Findings: Cardiomediastinal silhouette is unremarkable.  No acute infiltrate or pleural effusion.  No pulmonary edema.  Degenerative changes thoracic spine.  IMPRESSION: No active disease.  Degenerative changes thoracic spine.   Original Report Authenticated By: Natasha Mead, M.D.   Ct Chest W Contrast  04/04/2013   *RADIOLOGY REPORT*  Clinical Data: Stage III breast cancer  CT CHEST WITH CONTRAST  Technique:  Multidetector  CT imaging of the chest was performed following the standard protocol during bolus administration of intravenous contrast.  Contrast: 80mL OMNIPAQUE IOHEXOL 300 MG/ML  SOLN  Comparison: None  Findings: Right breast mass is again noted within the periphery of the right breast.  This is better defined on recent MRI.  There are multiple right axillary lymph nodes.  These are hypermetabolic on PET CT from today.  Index node measures 8 mm, image 16/series 2. Adjacent index lymph node measures 7 mm, image 6/series 2.  Left axillary adenopathy.  There is no supraclavicular adenopathy.  The heart size appears normal.  No pericardial effusion.  There is no enlarged mediastinal or hilar lymph nodes.  No evidence for internal mammary adenopathy.  There is no pleural effusion identified.  Several tiny subpleural nodules are identified along the minor fissure.  These measure up to 3 mm, image 32/series 5.  Mild spondylosis identified within the thoracic spine. There is no worrisome lytic or sclerotic bone lesions identified.  Limited imaging through the upper abdomen low attenuation nodule in the left adrenal gland measures 3.5 HU and 1.2 cm, image 57/series 2.  Likely benign adenoma.  IMPRESSION:  1.  Right breast mass consistent with primary breast carcinoma. 2.  Sub centimeter right axillary lymph nodes are hypermetabolic on PET CT from today and are concerning for metastatic adenopathy. 3.  Small nonspecific nodules along the minor fissure of the right lung. Attention on follow-up imaging advised.   Original Report Authenticated By: Signa Kell, M.D.  US Breast Right  03/27/2013   **ADDENDUM** CREATED: 03/27/2013 13:33:25  The Impression should read 3.9 x 2 x 4.1 cm RIGHT BREAST mass.  Addended by:  Elpidio Eric. Si Gaul, M.D. on 03/27/2013 13:33:25.  **END ADDENDUM** SIGNED BY: Tinnie Gens T. Si Gaul, M.D.  03/27/2013   *RADIOLOGY REPORT*  Clinical Data:  52 year old female for annual bilateral mammograms and with palpable right breast  mass discovered on self examination.  DIGITAL DIAGNOSTIC BILATERAL MAMMOGRAM WITH CAD AND RIGHT BREAST ULTRASOUND:  Comparison:  12/11/2003  Findings:  ACR Breast Density Category 2: There is a scattered fibroglandular pattern.  A 3.5 x 4 cm irregular mass with distortion within the upper outer right breast is identified.  A few mildly enlarged lower level I right axillary lymph nodes are present. A 3 x 9 mm cluster of slightly heterogeneous calcifications within the posterior lower right breast are indeterminate. There is no evidence of suspicious mass, distortion or worrisome calcifications within the left breast.  Mammographic images were processed with CAD.  On physical exam, a firm palpable mass identified in the upper outer right breast.  Ultrasound is performed, showing a 3.9 x 2 x 4.1 cm irregular hypoechoic mass at the 10 o'clock position of the right breast 6 cm from the nipple. A few mildly enlarged lower level I right axillary/upper outer right breast lymph nodes are present, some which have lost their fatty hilum.  IMPRESSION: 3.9 x 2 x 4.1 cm irregular mass with mildly enlarged lower right axillary/upper outer right breast lymph nodes - highly suspicious for neoplasm with lymphatic spread.  3 x 9 mm cluster of indeterminate calcifications within the posterior lower right breast.  Tissue sampling of above areas is recommended.  BI-RADS CATEGORY 5:  Highly suggestive of malignancy - appropriate action should be taken.  RECOMMENDATION:  Ultrasound guided biopsy of the upper outer right breast mass and lymph node.  Stereotactic guided biopsy of right breast calcifications.  These biopsies will be performed today but dictated in a separate report.  I have discussed the findings and recommendations with the patient. Results were also provided in writing at the conclusion of the visit.   Original Report Authenticated By: Harmon Pier, M.D.   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 Family history of breast cancer in her mother at age 27 and maternal grandmother at age 34.  BILATERAL BREAST MRI WITH AND WITHOUT CONTRAST  Technique: Multiplanar, multisequence MR images of both breasts were obtained prior to and following the intravenous administration of 20 ml of Multihance.  Three dimensional images were evaluated at the independent DynaCad workstation.  Comparison:  No prior MRI.  Mammography 03/27/2013, 12/01/2003.  Findings: Large irregular enhancing mass in the upper outer quadrant of the right breast spanning 10-11 o'clock measuring approximately 5.4 x 3.6 x 3.3 cm, corresponding to the recently biopsied mass.  This mass demonstrates both plateau and washout kinetics.  Oval shaped enhancing satellite nodule immediately inferior and lateral to the dominant mass measuring 0.8 cm maximally, also with plateau and washout kinetics.  Approximate 2.8 cm focus of non mass-like enhancement in the posterior third of the breast at the level of the nipple, corresponding to the biopsy- proven DCIS.  No abnormal enhancement elsewhere in the right breast to suggest other foci of DCIS.  Pathologically enlarged right axillary lymph nodes and right intramammary lymph nodes,  including the previously biopsied node. The largest right axillary node measures approximately 2.8 cm in length.  No pathologic right internal mammary, left internal mammary or left axillary lymphadenopathy.  2 foci of non mass-like enhancement in the left breast, similar to the enhancement pattern of the DCIS in the right breast.  An approximate 1.3 cm focus is present in the lower inner quadrant at 7 o'clock approximately 8 cm from the nipple.  An approximate 2.2 cm focus is present in the lower outer quadrant at 5 o'clock approximately 12 cm from the nipple.  There is no correlate on the recent  mammogram.  IMPRESSION:  1.  Biopsy-proven invasive mammary cancer in the upper outer quadrant of the right breast with maximum measurement approximating 5.4 cm. 2.  0.8 cm satellite nodule immediately inferior and lateral to the dominant mass in the right breast. 3.  Approximate 2.8 cm focus of non mass-like enhancement in the posterior third of the right breast corresponding to the biopsy- proven DCIS. 4.  Pathologic right axillary lymphadenopathy.  No pathologic lymphadenopathy elsewhere. 5.  2 foci of non mass-like enhancement in the left breast, at the 7 o'clock position approximately 8 cm from the nipple and at the 5 o'clock position approximately 12 cm from the nipple, without mammographic correlate.  RECOMMENDATION: MR biopsy of the 2 foci of non mass-like enhancement in the left breast.  THREE-DIMENSIONAL MR IMAGE RENDERING ON INDEPENDENT WORKSTATION:  Three-dimensional MR images were rendered by post-processing of the original MR data on an independent workstation.  The three- dimensional MR images were interpreted, and findings were reported in the accompanying complete MRI report for this study.  BI-RADS CATEGORY 4:  Suspicious abnormality - biopsy should be considered.   Original Report Authenticated By: Hulan Saas, M.D.   Nm Pet Image Initial (pi) Skull Base To Thigh  04/04/2013   *RADIOLOGY REPORT*  Clinical Data: Initial treatment strategy for stage III breast cancer.  NUCLEAR MEDICINE PET SKULL BASE TO THIGH  Fasting Blood Glucose:  96  Technique:  15.1 mCi F-18 FDG was injected intravenously. CT data was obtained and used for attenuation correction and anatomic localization only.  (This was not acquired as a diagnostic CT examination.) Additional exam technical data entered on technologist worksheet.  Comparison:  None  Findings:  Neck: No hypermetabolic lymph nodes in the neck.  Chest:  No hypermetabolic mediastinal or hilar nodes.  No suspicious pulmonary nodules on the CT scan. Right  breast mass measures 4.1 cm and has an SUV max equal to 17.30 right axillary lymph nodes are identified.  There is increased FDG uptake associated these lymph nodes.  Index lymph node has an SUV max equal to 6.9, image 72.  Abdomen/Pelvis:  No abnormal hypermetabolic activity within the liver, pancreas, adrenal glands, or spleen.  Low attenuation nodule in the left adrenal gland likely represents a benign adenoma, image 129/series 2.  No hypermetabolic lymph nodes in the abdomen or pelvis.  Skeleton:  No focal hypermetabolic activity to suggest skeletal metastasis.  IMPRESSION:  1.  Mass in the right breast is intensely hypermetabolic consistent with primary breast carcinoma. 2.  Hypermetabolic right axillary lymph nodes compatible with metastatic adenopathy. 3.  No evidence for distant metastatic disease.   Original Report Authenticated By: Signa Kell, M.D.   Nm Sentinel Node Inj-no Rpt (breast)  04/07/2013   CLINICAL DATA: abnormal nodules on left - to get left mastecotmy - to do  only left axillary sentinel lymph node   Sulfur colloid  was injected intradermally by the nuclear medicine  technologist for breast cancer sentinel node localization.    Dg Chest Portable 1 View  04/07/2013   *RADIOLOGY REPORT*  Clinical Data: Port-A-Cath placement.  PORTABLE CHEST - 1 VIEW  Comparison: 04/03/2013  Findings: Left subclavian Port-A-Cath has been placed.  The tip is in the right atrium approximately 4 cm above the carina.  There are low lung volumes with mild cardiomegaly, vascular congestion and bibasilar atelectasis.  Skin staples noted across the lower chest with probable surgical drains in the anterior soft tissues.  No pneumothorax.  No acute bony abnormality.  IMPRESSION: Left subclavian Port-A-Cath tip in the mid-right atrium. Location of the tip in the right atrium may partially be related to the low lung volumes and semi recumbent nature of the study.  If further evaluation is felt warranted, upright  two-view chest x-ray may demonstrate more optimal positioning of the catheter tip near the cavoatrial junction when the patient is recovered and able to take a deeper breath.  No pneumothorax.  Vascular congestion with bibasilar atelectasis and low lung volumes.   Original Report Authenticated By: Charlett Nose, M.D.   Mm Digital Diagnostic Bilat  03/27/2013   **ADDENDUM** CREATED: 03/27/2013 13:33:25  The Impression should read 3.9 x 2 x 4.1 cm RIGHT BREAST mass.  Addended by:  Elpidio Eric. Si Gaul, M.D. on 03/27/2013 13:33:25.  **END ADDENDUM** SIGNED BY: Tinnie Gens T. Si Gaul, M.D.  03/27/2013   *RADIOLOGY REPORT*  Clinical Data:  52 year old female for annual bilateral mammograms and with palpable right breast mass discovered on self examination.  DIGITAL DIAGNOSTIC BILATERAL MAMMOGRAM WITH CAD AND RIGHT BREAST ULTRASOUND:  Comparison:  12/11/2003  Findings:  ACR Breast Density Category 2: There is a scattered fibroglandular pattern.  A 3.5 x 4 cm irregular mass with distortion within the upper outer right breast is identified.  A few mildly enlarged lower level I right axillary lymph nodes are present. A 3 x 9 mm cluster of slightly heterogeneous calcifications within the posterior lower right breast are indeterminate. There is no evidence of suspicious mass, distortion or worrisome calcifications within the left breast.  Mammographic images were processed with CAD.  On physical exam, a firm palpable mass identified in the upper outer right breast.  Ultrasound is performed, showing a 3.9 x 2 x 4.1 cm irregular hypoechoic mass at the 10 o'clock position of the right breast 6 cm from the nipple. A few mildly enlarged lower level I right axillary/upper outer right breast lymph nodes are present, some which have lost their fatty hilum.  IMPRESSION: 3.9 x 2 x 4.1 cm irregular mass with mildly enlarged lower right axillary/upper outer right breast lymph nodes - highly suspicious for neoplasm with lymphatic spread.  3 x 9 mm cluster of  indeterminate calcifications within the posterior lower right breast.  Tissue sampling of above areas is recommended.  BI-RADS CATEGORY 5:  Highly suggestive of malignancy - appropriate action should be taken.  RECOMMENDATION:  Ultrasound guided biopsy of the upper outer right breast mass and lymph node.  Stereotactic guided biopsy of right breast calcifications.  These biopsies will be performed today but dictated in a separate report.  I have discussed the findings and recommendations with the patient. Results were also provided in writing at the conclusion of the visit.   Original Report Authenticated By: Harmon Pier, M.D.     Discharge Exam:  Filed Vitals:   04/09/13 0605  BP: 147/58  Pulse: 89  Temp: 97.4 F (36.3  C)  Resp: 18    General: WN obese WF who is alert.  Lungs: Clear to auscultation and symmetric breath sounds. Heart:  RRR. No murmur or rub. Chest:  Dressings dry.  Incisions that I could see looked okay.  Drains #1 - 25 cc, Drain #4 - 105 cc. Extremities:  More pain on left chest/arm.  Discharge Medications:     Medication List    ASK your doctor about these medications       acetaminophen 500 MG tablet  Commonly known as:  TYLENOL  Take 1,000 mg by mouth 2 (two) times daily as needed. For pain     ALPRAZolam 0.25 MG tablet  Commonly known as:  XANAX  Take 1 tablet by mouth Three times a day.     escitalopram 10 MG tablet  Commonly known as:  LEXAPRO  Take 1 tablet by mouth daily.     meloxicam 15 MG tablet  Commonly known as:  MOBIC  Take 1 tablet by mouth daily.     triamterene-hydrochlorothiazide 37.5-25 MG per tablet  Commonly known as:  MAXZIDE-25  Take 1 tablet by mouth daily.        Disposition: 01-Home or Self Care       Future Appointments Provider Department Dept Phone   04/10/2013 2:00 PM [facility] SITE 3 ECHO LAB [phone]   04/29/2013 12:00 PM [facility] MEDICAL  ONCOLOGY [phone]   05/02/2013 9:00 AM Krista [facility] MEDICAL ONCOLOGY [phone]   05/02/2013 9:30 AM Lowella Dell, MD [facility] MEDICAL ONCOLOGY [phone]     Activity:  Driving - No driving until doing well from incisions   Lifting - No lifting greater than 15 pounds for one week, then no limit  Wound Care:   May remove bandage tomorrow and shower.       Empty drains and record amount of all 4 drains  Diet:  As tolerated  Follow up appointment:  Call Dr. Allene Pyo office [facility]) at [phone] for an appointment next week.  Medications and dosages:  Resume your home medications.  You have a prescription for:  Vicodin for pain and Zofran for nausea  Signed: Ovidio Kin, M.D., FACS  04/09/2013, 7:36 AM

## 2013-04-09 NOTE — Discharge Summary (Signed)
Patient is hemodynamically stable for d/c. Discharge instructions were explained to her daughter. She was told about dressing changing and verbalized being giving instructions by the surgeon to wait 3 days until showering and or replacing dsg. Patient was given prescription by the physician and instructed to not take tylenol and the prescribed pain medicine which also contained tylenol. Patient's daughter was given measuring cups and an info sheet detailing how to empty, charge and record drainage from jp drains. The patient and daughter were able to express comfort with caring for both the jp drain and incision site. They are aware of fu appts and know not to remove steri strips prior to them following off. Neither had any concerns or questions. Awaiting transporters for transport downstairs to d/c.

## 2013-04-09 NOTE — Plan of Care (Signed)
Problem: Discharge Progression Outcomes Goal: Tubes and drains discontinued if indicated Outcome: Not Applicable Date Met:  04/09/13 Patient is going home with 4 jp drains. Daughter demonstrated how to empty, measure, record and recharge the drain

## 2013-04-10 ENCOUNTER — Telehealth: Payer: Self-pay | Admitting: *Deleted

## 2013-04-10 ENCOUNTER — Other Ambulatory Visit (HOSPITAL_COMMUNITY): Payer: Medicaid Other

## 2013-04-10 ENCOUNTER — Other Ambulatory Visit: Payer: Self-pay | Admitting: Oncology

## 2013-04-10 ENCOUNTER — Encounter (INDEPENDENT_AMBULATORY_CARE_PROVIDER_SITE_OTHER): Payer: Self-pay

## 2013-04-10 NOTE — Telephone Encounter (Signed)
Lm gv appt d/t for 04/25/13. i also made the pt aware that i will mail a letter/cal as well....td

## 2013-04-11 ENCOUNTER — Telehealth: Payer: Self-pay | Admitting: *Deleted

## 2013-04-11 ENCOUNTER — Telehealth (INDEPENDENT_AMBULATORY_CARE_PROVIDER_SITE_OTHER): Payer: Self-pay

## 2013-04-11 NOTE — Telephone Encounter (Signed)
Left vm for pt to return call concerning treatment care plan and f/u care.  Contact information given.

## 2013-04-11 NOTE — Telephone Encounter (Signed)
P/O appt 04/16/13 @ 12:30 with Dr. Ezzard Standing Pt aware

## 2013-04-14 ENCOUNTER — Telehealth (INDEPENDENT_AMBULATORY_CARE_PROVIDER_SITE_OTHER): Payer: Self-pay

## 2013-04-14 ENCOUNTER — Telehealth: Payer: Self-pay | Admitting: *Deleted

## 2013-04-14 NOTE — Telephone Encounter (Signed)
Pt called questioning about taking a shower after surgery.  Pt relate she is unable to get into shower d/t fear of her surgery site.  Informed pt to take a sponge bath and to wash lightly around her mastectomy site, but to not wash over the incision.  Received verbal understanding.  Gave pt emotional support r/t mastectomy and fear of site.  Gave encouragement and instructions to daughter to help with bathing pt.  Pt also c/o constipation.  Pt relate she has tried stool softeners.  Gave pt instructions on taking Miralax.  Confirmed future appts and encouraged pt to call with further questions or concerns.  Contact information given.

## 2013-04-14 NOTE — Telephone Encounter (Addendum)
F/U call Patient states she is constipated. Advised her to continue the miralax and stool softener and to add green leaf vegetables , fruits ,increase fluids and she may want to try warmed prune juice or apple juice. She states she is taking hydrocodone APAP 5-325 mg it is helping her pain  . She got a refill today. P/O with Dr. Ezzard Standing is 04/16/16 @ 1230p Patient is aware. Advised to call with any concerns or changes.

## 2013-04-16 ENCOUNTER — Encounter (HOSPITAL_COMMUNITY): Payer: Self-pay | Admitting: Adult Health

## 2013-04-16 ENCOUNTER — Encounter (INDEPENDENT_AMBULATORY_CARE_PROVIDER_SITE_OTHER): Payer: Self-pay | Admitting: Surgery

## 2013-04-16 ENCOUNTER — Emergency Department (HOSPITAL_COMMUNITY): Payer: Medicaid Other

## 2013-04-16 ENCOUNTER — Inpatient Hospital Stay (HOSPITAL_COMMUNITY)
Admission: EM | Admit: 2013-04-16 | Discharge: 2013-04-19 | DRG: 607 | Disposition: A | Discharge status: Home / Self Care | Payer: Medicaid Other | Attending: Internal Medicine | Admitting: Internal Medicine

## 2013-04-16 ENCOUNTER — Ambulatory Visit (INDEPENDENT_AMBULATORY_CARE_PROVIDER_SITE_OTHER): Payer: Medicaid Other | Admitting: Surgery

## 2013-04-16 VITALS — BP 140/88 | HR 84 | Temp 98.0°F | Resp 18

## 2013-04-16 DIAGNOSIS — E872 Acidosis, unspecified: Secondary | ICD-10-CM | POA: Diagnosis present

## 2013-04-16 DIAGNOSIS — T391X5A Adverse effect of 4-Aminophenol derivatives, initial encounter: Secondary | ICD-10-CM | POA: Diagnosis present

## 2013-04-16 DIAGNOSIS — K5900 Constipation, unspecified: Secondary | ICD-10-CM | POA: Diagnosis present

## 2013-04-16 DIAGNOSIS — K649 Unspecified hemorrhoids: Secondary | ICD-10-CM | POA: Diagnosis present

## 2013-04-16 DIAGNOSIS — F1210 Cannabis abuse, uncomplicated: Secondary | ICD-10-CM | POA: Diagnosis present

## 2013-04-16 DIAGNOSIS — G4733 Obstructive sleep apnea (adult) (pediatric): Secondary | ICD-10-CM | POA: Diagnosis present

## 2013-04-16 DIAGNOSIS — I10 Essential (primary) hypertension: Secondary | ICD-10-CM | POA: Diagnosis present

## 2013-04-16 DIAGNOSIS — F411 Generalized anxiety disorder: Secondary | ICD-10-CM | POA: Diagnosis present

## 2013-04-16 DIAGNOSIS — D62 Acute posthemorrhagic anemia: Secondary | ICD-10-CM | POA: Diagnosis present

## 2013-04-16 DIAGNOSIS — N179 Acute kidney failure, unspecified: Secondary | ICD-10-CM | POA: Diagnosis present

## 2013-04-16 DIAGNOSIS — C50419 Malignant neoplasm of upper-outer quadrant of unspecified female breast: Secondary | ICD-10-CM

## 2013-04-16 DIAGNOSIS — Z9010 Acquired absence of unspecified breast and nipple: Secondary | ICD-10-CM

## 2013-04-16 DIAGNOSIS — D72829 Elevated white blood cell count, unspecified: Secondary | ICD-10-CM | POA: Diagnosis present

## 2013-04-16 DIAGNOSIS — L500 Allergic urticaria: Principal | ICD-10-CM | POA: Diagnosis present

## 2013-04-16 DIAGNOSIS — C50411 Malignant neoplasm of upper-outer quadrant of right female breast: Secondary | ICD-10-CM

## 2013-04-16 DIAGNOSIS — T7840XA Allergy, unspecified, initial encounter: Secondary | ICD-10-CM

## 2013-04-16 DIAGNOSIS — K6289 Other specified diseases of anus and rectum: Secondary | ICD-10-CM | POA: Diagnosis present

## 2013-04-16 DIAGNOSIS — F3289 Other specified depressive episodes: Secondary | ICD-10-CM | POA: Diagnosis present

## 2013-04-16 DIAGNOSIS — E876 Hypokalemia: Secondary | ICD-10-CM | POA: Diagnosis present

## 2013-04-16 DIAGNOSIS — F329 Major depressive disorder, single episode, unspecified: Secondary | ICD-10-CM | POA: Diagnosis present

## 2013-04-16 LAB — CBC WITH DIFFERENTIAL/PLATELET
Basophils Relative: 0 % (ref 0–1)
HCT: 45.2 % (ref 36.0–46.0)
Hemoglobin: 15.8 g/dL — ABNORMAL HIGH (ref 12.0–15.0)
Lymphs Abs: 1.6 10*3/uL (ref 0.7–4.0)
MCH: 31.2 pg (ref 26.0–34.0)
MCHC: 35 g/dL (ref 30.0–36.0)
Neutro Abs: 28.5 10*3/uL — ABNORMAL HIGH (ref 1.7–7.7)
RDW: 13.2 % (ref 11.5–15.5)

## 2013-04-16 LAB — BASIC METABOLIC PANEL
Chloride: 95 mEq/L — ABNORMAL LOW (ref 96–112)
GFR calc Af Amer: 49 mL/min — ABNORMAL LOW (ref >90)
GFR calc non Af Amer: 42 mL/min — ABNORMAL LOW (ref >90)
Potassium: 4.2 mEq/L (ref 3.5–5.1)
Sodium: 137 mEq/L (ref 135–145)

## 2013-04-16 LAB — TROPONIN I: Troponin I: <0.3 ng/mL (ref <0.30)

## 2013-04-16 MED ORDER — SODIUM CHLORIDE 0.9 % IV SOLN
1000.0000 mL | INTRAVENOUS | Status: DC
  Administered 2013-04-16: 1000 mL via INTRAVENOUS

## 2013-04-16 MED ORDER — FAMOTIDINE IN NACL 20-0.9 MG/50ML-% IV SOLN
20.0000 mg | Freq: Once | INTRAVENOUS | Status: AC
  Administered 2013-04-16: 20 mg via INTRAVENOUS
  Filled 2013-04-16: qty 50

## 2013-04-16 MED ORDER — METHYLPREDNISOLONE SODIUM SUCC 125 MG IJ SOLR
125.0000 mg | Freq: Once | INTRAMUSCULAR | Status: AC
  Administered 2013-04-16: 125 mg via INTRAVENOUS
  Filled 2013-04-16: qty 2

## 2013-04-16 MED ORDER — EPINEPHRINE HCL 0.1 MG/ML IJ SOSY
0.3000 mg | PREFILLED_SYRINGE | Freq: Once | INTRAMUSCULAR | Status: AC
  Administered 2013-04-16: 0.3 mg via SUBCUTANEOUS
  Filled 2013-04-16: qty 10

## 2013-04-16 MED ORDER — DIPHENHYDRAMINE HCL 50 MG/ML IJ SOLN
25.0000 mg | Freq: Once | INTRAMUSCULAR | Status: AC
  Administered 2013-04-16: 25 mg via INTRAVENOUS
  Filled 2013-04-16: qty 1

## 2013-04-16 MED ORDER — SODIUM CHLORIDE 0.9 % IV SOLN
1000.0000 mL | Freq: Once | INTRAVENOUS | Status: AC
  Administered 2013-04-16: 1000 mL via INTRAVENOUS

## 2013-04-16 NOTE — Progress Notes (Signed)
Re:   [name] [name] DOB:   June 12, 1961 MRN:   [id number]  [facility]  ASSESSMENT AND PLAN: 1.  Right breast cancer - two areas - one IDC (at 9 o'clock and palpable) and one is DCIS (5 o'clock) pT2, pN2a  Invasive Lobular Ca, Grade II/III, ER - 90%, PR - 100%, Her2Neu - negative, Ki-67 - 15%  Final path - 4.4 cm IDC, 9/19 nodes positive (level 2 node behind pect minor was negative)  Treating oncologist - [name] [name]  Plan: 1) Right MRM, Left simple mastectomy with left axillary SLNBx, power port placement - done, 2) chemotx, 3) right chest irradiation  She has an echo tomorrow and chemo class next week.  I gave her #30 of Vicodin (no refills), but she knows that this worsens her constipation without activity.  I gave her #15 Zofran (4 mg) for nausea.  She will return for wound check (probably remove the remainder of her staples) and drain recheck in one week.  She knows that I am not in the office next week and she will see a partner.   1a.  Will need genetics  2.  Left mastectomy  Final path report - Radial scar, 0/1 nodes.  3.  Sleep apnea  On CPAP x 8 years 4.  Hypertension 5.  Morbid obesity 6.  Questionable recent history of MRSA 7.  History of urinary tract infections. 8.  History of anxiety 9.  Anxiety today with wheezing  This had pretty much resolved by the time she left the office, but she knew to go the to ER if it returns or she has other problems. 10.  Inactive since surgery - she needs to be more active.  Chief Complaint  Patient presents with  . Routine Post Op    masty   REFERRING PHYSICIAN: Raliegh Ip, MD  HISTORY OF PRESENT ILLNESS: [name] [name] is a 52 y.o. (DOB: Mar 11, 1961)  white  female whose primary care physician is [name] Ip, MD [name]) and comes to me today for follow up of right breast cancer and bilateral mastectomies.  Her daughter, [name] with her. Her separated husband brought her here today, but he did not come back to  the exam room.  She cames very anxious, did not want to get out of the wheelchair, and said she thought she had trouble breathing.  But as she stayed here, her breathing got better and she said that her symptoms improved.  Her pulse slowed down and her sats were .90%.  We talked about going to the ER if her symptoms return or she has more trouble.  In talking to her, I'm not sure that she has been out of bed much at all.  She has certainly not walked out of the house.  She is having trouble with constipation, probably secondary to inactivity and use of narcotics.  I reviewed the path report and gave her a copy.  Breast History: The patient has had no prior breast biopsy or breast disease. Her last mammograms were around 2009. She says she cannot for mammograms in the intervening years. She recently had a urinary tract infection and was seen at the [name] emergency room at the end of April.  They recognized that she had an abnormal right breast suggested that she have mammograms. She said she felt she had a right axillary infection that was being treated for MRSA.  On 03/27/2013 she underwent a mammogram that showed a 4.1 cm irregular lesion at the  10:00 position of the right breast. She also had an abnormality at the 5:00 position of the right breast. 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  She subsequently underwent an MRI of both breast on 04/01/2013 showed the lesions on the right,  It also showed 2 foci of masslike enhancement at the 7:00 position and the 5:00 position in the left breast. Biopsies were suggested for these lesions.  The patient had her last menstrual period September 2013. She stopped birth-control pills around the same time. Recently she had another period.  Her grandmother had breast cancer. Her mother had breast  cancer over 30 years ago. Her mother had bilateral mastectomies with recostruction. I think this has helped the patient in her decisions about treatment.  She has one daughter who is with her today.    Past Medical History  Diagnosis Date  . Hypertension   . Anxiety   . Depression   . Headache   . Sleep apnea     cpcp  . Shortness of breath   . Breast cancer       Past Surgical History  Procedure Laterality Date  . Mastectomy, radical Right 04/08/2013  . Simple mastectomy with axillary sentinel node biopsy Left 04/08/2013  . Mastectomy modified radical Right 04/07/2013    Procedure: RIGHT MASTECTOMY MODIFIED RADICAL;  Surgeon: [name] Cocking, MD;  Location: [facility] OR;  Service: General;  Laterality: Right;  . Simple mastectomy with axillary sentinel node biopsy Left 04/07/2013    Procedure:  LEFT SMPLE MASTECTOMY WITH AXILLARY SENTINEL NODE BIOPSY;  Surgeon: [name] Cocking, MD;  Location: [facility] OR;  Service: General;  Laterality: Left;  . Portacath placement Left 04/07/2013    Procedure: INSERTION PORT-A-CATH;  Surgeon: [name] Cocking, MD;  Location: [facility] OR;  Service: General;  Laterality: Left;      Current Outpatient Prescriptions  Medication Sig Dispense Refill  . acetaminophen (TYLENOL) 500 MG tablet Take 1,000 mg by mouth 2 (two) times daily as needed. For pain      . ALPRAZolam (XANAX) 0.25 MG tablet Take 1 tablet by mouth Three times a day.      . escitalopram (LEXAPRO) 10 MG tablet Take 1 tablet by mouth daily.      . meloxicam (MOBIC) 15 MG tablet Take 1 tablet by mouth daily.      Marland Kitchen triamterene-hydrochlorothiazide (MAXZIDE-25) 37.5-25 MG per tablet Take 1 tablet by mouth daily.       No current facility-administered medications for this visit.      Allergies  Allergen Reactions  . Codeine Nausea And Vomiting  . Penicillins Rash  . Percocet (Oxycodone-Acetaminophen) Itching  . Septra (Bactrim) Other (See Comments)    Heart races    REVIEW OF SYSTEMS: Skin:  No history  of rash.  No history of abnormal moles. Infection:  She said that she has been treated for MRSA since Sept of last year, but in part this seems to be self treatment with out bacteriology. Neurologic:  No history of stroke.  No history of seizure.  No history of headaches. Cardiac:  No history of hypertension. No history of heart disease.  No history of prior cardiac catheterization.  No history of seeing a cardiologist. Pulmonary:  Has OSA/CPAP x 8 years.  Sees Dr. Melba Coon, whom she last saw about 1 and 1/2 years ago.  Endocrine:  No diabetes. No thyroid disease. Gastrointestinal:  No history of stomach disease.  No history of liver disease.  No history of gall bladder disease.  No history of pancreas disease.  No history of colon disease. Urologic:  Has been to ER twice in last 6 months for "bad" kidney infections (but not hospitalized). GYN:  One daughter. 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  Has had cortisone shots.  Right knee worse.  Dr. August Saucer is ortho. Hematologic:  No bleeding disorder.  No history of anemia.  Not anticoagulated. Psycho-social:  The patient has an anxious personality.  SOCIAL and FAMILY HISTORY: Divorced.  Lives with mother. At initial visit, she had her mother, [name], and her daughter, Marieli Rudy with her. Unemployed as of Sept 2013.  Did work as a Sales executive.    PHYSICAL EXAM: BP 140/88  Pulse 84  Temp(Src) 98 F (36.7 C)  Resp 18  LMP 03/04/2013  General: Obese WF who is alert but worried. HEENT: Normal. Pupils equal. Lymph Nodes:  No supraclavicular or cervical nodes.  Lungs: Some wheezing, which got better as she was here.  Sats 92-94% Heart:  Tachycardic at first (110-120), but pulse down to 90 when she left. No murmur or rub. Breast:  Right:  Mastectomy wound looks good.  I removed 1/2 staples.  I removed the medial drain.  Left:  Mastectomy wound looks good.  I removed 1/2 staples.  I removed the both drains. Chest:  Port  left subclavian is okay.  DATA REVIEWED: Path report to patient.  Ovidio Kin, MD,  [name] [address] [place]  [address]   [place], [place]    [address] Phone:  [phone] FAX:  [phone]

## 2013-04-16 NOTE — ED Notes (Signed)
Pt st's rash is getting worse on throat area and bil shoulders and upper arms.  No resp. Distress noted.  Dr. Preston Fleeting made aware.

## 2013-04-16 NOTE — ED Notes (Signed)
Preston Fleeting, MD notified of abnormal lab test results

## 2013-04-16 NOTE — ED Notes (Signed)
Pt st's she started feeling bad earlier today with nausea and rectal pain.  Last BM on day of surgery 04/07/13.  St's she has been drinking apple juice without relief.  Pt also c/o hives with itching not sure what it is from.

## 2013-04-16 NOTE — ED Notes (Signed)
Alert, oriented,  c/o itching to face, neck , shoulders and BLE.  Cardiac monitor SR rate 90.  Binder noted to Chest s/p Bil mastectomy. PAC to left chest deaccessed.  Abd soft, nontender but c/o rectal pain.  LBM 5/12.  Family at bedside

## 2013-04-16 NOTE — ED Provider Notes (Signed)
History     CSN: [id number]  Arrival date & time 04/16/13  2126   First MD Initiated Contact with Patient 04/16/13 2157      Chief Complaint  Patient presents with  . Post-op Problem    (Consider location/radiation/quality/duration/timing/severity/associated sxs/prior treatment) The history is provided by the patient and a relative.   52 year old female who is 9 days status post bilateral mastectomy noted onset this morning of hives on her arms and legs. This is somewhat itchy and she has taken Benadryl with no relief. She has been having difficulty breathing for several days and this is unchanged. She denies chest pain. She has been having problems with constipation and has taken Dulcolax and MiraLAX with no relief. She's complaining of rectal pain and pressure. She's had mild nausea but no family.  Past Medical History  Diagnosis Date  . Hypertension   . Anxiety   . Depression   . Headache   . Sleep apnea     cpcp  . Shortness of breath   . Breast cancer     Past Surgical History  Procedure Laterality Date  . Mastectomy, radical Right 04/08/2013  . Simple mastectomy with axillary sentinel node biopsy Left 04/08/2013  . Mastectomy modified radical Right 04/07/2013    Procedure: RIGHT MASTECTOMY MODIFIED RADICAL;  Surgeon: Kandis Cocking, MD;  Location: [facility] OR;  Service: General;  Laterality: Right;  . Simple mastectomy with axillary sentinel node biopsy Left 04/07/2013    Procedure:  LEFT SMPLE MASTECTOMY WITH AXILLARY SENTINEL NODE BIOPSY;  Surgeon: Kandis Cocking, MD;  Location: [facility] OR;  Service: General;  Laterality: Left;  . Portacath placement Left 04/07/2013    Procedure: INSERTION PORT-A-CATH;  Surgeon: Kandis Cocking, MD;  Location: [facility] OR;  Service: General;  Laterality: Left;    Family History  Problem Relation Age of Onset  . Breast cancer Mother 76  . Breast cancer Maternal Grandmother 80  . Prostate cancer Paternal Grandfather 2    History  Substance Use  Topics  . Smoking status: Never Smoker   . Smokeless tobacco: Never Used  . Alcohol Use: No    OB History   Grav Para Term Preterm Abortions TAB SAB Ect Mult Living                  Review of Systems  All other systems reviewed and are negative.    Allergies  Codeine; Penicillins; Percocet; and Septra  Home Medications   Current Outpatient Rx  Name  Route  Sig  Dispense  Refill  . acetaminophen (TYLENOL) 500 MG tablet   Oral   Take 1,000 mg by mouth 2 (two) times daily as needed. For pain         . ALPRAZolam (XANAX) 0.25 MG tablet   Oral   Take 1 tablet by mouth Three times a day.         . escitalopram (LEXAPRO) 10 MG tablet   Oral   Take 1 tablet by mouth daily.         . meloxicam (MOBIC) 15 MG tablet   Oral   Take 1 tablet by mouth daily.         Marland Kitchen triamterene-hydrochlorothiazide (MAXZIDE-25) 37.5-25 MG per tablet   Oral   Take 1 tablet by mouth daily.           BP 143/97  Pulse 140  Temp(Src) 98.1 F (36.7 C) (Oral)  Resp 22  SpO2 96%  LMP 03/04/2013  Physical Exam  Nursing note and vitals reviewed.  52 year old female, resting comfortably and in no acute distress. Vital signs are significant for tachycardia with heart rate of 140, tachypnea with a respiratory rate of 22, and hypertension with blood pressure 143/97. Oxygen saturation is 96%, which is normal. Head is normocephalic and atraumatic. PERRLA, EOMI. Oropharynx is clear. No mucous membrane lesions are seen. There is no edema the uvula that she has no difficulty with secretions. Voice is normal. Neck is nontender and supple without adenopathy or JVD. Back is nontender and there is no CVA tenderness. Lungs are clear without rales, wheezes, or rhonchi. Chest is nontender. Heart has regular rate and rhythm without murmur. Abdomen is soft, flat, nontender without masses or hepatosplenomegaly and peristalsis is normoactive. Extremities have no cyanosis or edema, full range of motion  is present. Skin is warm and dry. Scattered urticarial lesions are seen. Neurologic: Mental status is normal, cranial nerves are intact, there are no motor or sensory deficits.  ED Course  Procedures (including critical care time)  Images viewed by me.   Date: 04/16/2013  Rate: 138  Rhythm: sinus tachycardia  QRS Axis: normal  Intervals: normal  ST/T Wave abnormalities: normal  Conduction Disutrbances:left bundle branch block  Narrative Interpretation: Sinus tachycardia, left bundle branch block. When compared with ECG of 04/03/2013, heart rate has increased but no other changes are seen.  Old EKG Reviewed: unchanged    1. Allergic reaction, initial encounter   2. Lactic acidosis      CRITICAL CARE Performed by: ZOXWR,UEAVW Total critical care time: 60 minutes Critical care time was exclusive of separately billable procedures and treating other patients. Critical care was necessary to treat or prevent imminent or life-threatening deterioration. Critical care was time spent personally by me on the following activities: development of treatment plan with patient and/or surrogate as well as nursing, discussions with consultants, evaluation of patient's response to treatment, examination of patient, obtaining history from patient or surrogate, ordering and performing treatments and interventions, ordering and review of laboratory studies, ordering and review of radiographic studies, pulse oximetry and re-evaluation of patient's condition.   MDM  Pelvic area which is most likely secondary to hydrocodone given that she has a history of codeine and Percocet allergy. She does not appear to be suffering from anaphylaxis. There is no evidence of Stevens-Johnson syndrome. Constipation is most likely secondary to narcotic use. I am worried about her tachycardia and tachypnea 9 days postoperatively-this may represent pulmonary embolism, so she will be sent for CT angiogram.  After the above  noted treatment, urticaria actually increased. Heart rate came down with IV hydration but she is noted to have a significantly elevated lactic acid level. She was given an additional dose of diphenhydramine and given a dose of epinephrine. Following this, urticaria resolved. CT angiogram result is pending at this point but I have reviewed the images and do not see evidence of pulmonary embolism or pneumonia. Leukocytosis is noted and probably this is a stress reaction since I do not see any source of infection. Patient was now stable with a good blood pressure and normal heart rate. Because of lactic acidosis, it is elected to admit her for further observation. Case is discussed with Dr. Toniann Fail of [facility] who agrees to admit the patient. She will be placed in a step down unit.      Dione Booze, MD 04/17/13 [phone]

## 2013-04-16 NOTE — ED Notes (Addendum)
Presents post double mastectomy on 04/07/13, went home weds. Past two days c/o rectal pain, since 5 pm tonight reports bilateral hand and leg swelling, HR 14os. Hives noted to bilateral upper arms, reports nausea. Denies SOB.  Began hydrocodone and zofran one week ago.

## 2013-04-17 ENCOUNTER — Other Ambulatory Visit (HOSPITAL_COMMUNITY): Payer: Medicaid Other

## 2013-04-17 ENCOUNTER — Encounter (HOSPITAL_COMMUNITY): Payer: Self-pay | Admitting: Radiology

## 2013-04-17 ENCOUNTER — Emergency Department (HOSPITAL_COMMUNITY): Payer: Medicaid Other

## 2013-04-17 DIAGNOSIS — T7840XA Allergy, unspecified, initial encounter: Secondary | ICD-10-CM

## 2013-04-17 DIAGNOSIS — I10 Essential (primary) hypertension: Secondary | ICD-10-CM | POA: Diagnosis present

## 2013-04-17 DIAGNOSIS — N179 Acute kidney failure, unspecified: Secondary | ICD-10-CM | POA: Diagnosis present

## 2013-04-17 DIAGNOSIS — D72829 Elevated white blood cell count, unspecified: Secondary | ICD-10-CM | POA: Diagnosis present

## 2013-04-17 LAB — CBC WITH DIFFERENTIAL/PLATELET
HCT: 36.8 % (ref 36.0–46.0)
Hemoglobin: 12.4 g/dL (ref 12.0–15.0)
Lymphs Abs: 1.2 10*3/uL (ref 0.7–4.0)
MCH: 29.9 pg (ref 26.0–34.0)
Monocytes Absolute: 0.6 10*3/uL (ref 0.1–1.0)
Monocytes Relative: 2 % — ABNORMAL LOW (ref 3–12)
Neutro Abs: 22.3 10*3/uL — ABNORMAL HIGH (ref 1.7–7.7)
Neutrophils Relative %: 93 % — ABNORMAL HIGH (ref 43–77)
RBC: 4.15 MIL/uL (ref 3.87–5.11)

## 2013-04-17 LAB — COMPREHENSIVE METABOLIC PANEL
ALT: 26 U/L (ref 0–35)
AST: 23 U/L (ref 0–37)
CO2: 21 mEq/L (ref 19–32)
Calcium: 8.8 mg/dL (ref 8.4–10.5)
Creatinine, Ser: 1.11 mg/dL — ABNORMAL HIGH (ref 0.50–1.10)
GFR calc Af Amer: 66 mL/min — ABNORMAL LOW (ref >90)
GFR calc non Af Amer: 57 mL/min — ABNORMAL LOW (ref >90)
Sodium: 135 mEq/L (ref 135–145)
Total Protein: 6.7 g/dL (ref 6.0–8.3)

## 2013-04-17 LAB — CG4 I-STAT (LACTIC ACID)
Lactic Acid, Venous: 5.71 mmol/L — ABNORMAL HIGH (ref 0.5–2.2)
Lactic Acid, Venous: 1.23 mmol/L (ref 0.5–2.2)

## 2013-04-17 MED ORDER — ONDANSETRON HCL 4 MG PO TABS: 4.0000 mg | ORAL_TABLET | Freq: Four times a day (QID) | ORAL | Status: DC | PRN

## 2013-04-17 MED ORDER — BISACODYL 5 MG PO TBEC
10.0000 mg | DELAYED_RELEASE_TABLET | Freq: Every day | ORAL | Status: DC | PRN
Start: 2013-04-17 — End: 2013-04-19
  Administered 2013-04-17: 10 mg via ORAL
  Filled 2013-04-17: qty 2
  Filled 2013-04-17: qty 1

## 2013-04-17 MED ORDER — METHYLPREDNISOLONE SODIUM SUCC 40 MG IJ SOLR
40.0000 mg | Freq: Every day | INTRAMUSCULAR | Status: DC
  Administered 2013-04-17 – 2013-04-18 (×2): 40 mg via INTRAVENOUS
  Filled 2013-04-17 (×2): qty 1

## 2013-04-17 MED ORDER — DIPHENHYDRAMINE HCL 50 MG/ML IJ SOLN
25.0000 mg | Freq: Four times a day (QID) | INTRAMUSCULAR | Status: DC | PRN
  Administered 2013-04-17 (×2): 25 mg via INTRAVENOUS
  Filled 2013-04-17 (×2): qty 1

## 2013-04-17 MED ORDER — FAMOTIDINE IN NACL 20-0.9 MG/50ML-% IV SOLN
20.0000 mg | Freq: Two times a day (BID) | INTRAVENOUS | Status: DC
  Administered 2013-04-17 – 2013-04-19 (×5): 20 mg via INTRAVENOUS
  Filled 2013-04-17 (×6): qty 50

## 2013-04-17 MED ORDER — ALPRAZOLAM 0.25 MG PO TABS
0.2500 mg | ORAL_TABLET | Freq: Three times a day (TID) | ORAL | Status: DC
  Administered 2013-04-17 – 2013-04-19 (×8): 0.25 mg via ORAL
  Filled 2013-04-17 (×8): qty 1

## 2013-04-17 MED ORDER — ONDANSETRON HCL 4 MG/2ML IJ SOLN: 4.0000 mg | Freq: Four times a day (QID) | INTRAMUSCULAR | Status: DC | PRN

## 2013-04-17 MED ORDER — IOHEXOL 350 MG/ML SOLN
100.0000 mL | Freq: Once | INTRAVENOUS | Status: AC | PRN
  Administered 2013-04-17: 100 mL via INTRAVENOUS

## 2013-04-17 MED ORDER — ACETAMINOPHEN 325 MG PO TABS
650.0000 mg | ORAL_TABLET | Freq: Four times a day (QID) | ORAL | Status: DC | PRN
  Administered 2013-04-17 – 2013-04-18 (×2): 650 mg via ORAL
  Filled 2013-04-17 (×6): qty 2

## 2013-04-17 MED ORDER — TRAMADOL HCL 50 MG PO TABS
100.0000 mg | ORAL_TABLET | Freq: Once | ORAL | Status: AC
  Administered 2013-04-17: 100 mg via ORAL
  Filled 2013-04-17: qty 2

## 2013-04-17 MED ORDER — LIDOCAINE-PRILOCAINE 2.5-2.5 % EX CREA
TOPICAL_CREAM | Freq: Once | CUTANEOUS | Status: AC
  Administered 2013-04-17: 15:00:00 via TOPICAL
  Filled 2013-04-17: qty 5

## 2013-04-17 MED ORDER — SODIUM CHLORIDE 0.9 % IV SOLN: INTRAVENOUS | Status: DC

## 2013-04-17 MED ORDER — ESCITALOPRAM OXALATE 10 MG PO TABS
10.0000 mg | ORAL_TABLET | Freq: Every day | ORAL | Status: DC
  Administered 2013-04-17 – 2013-04-19 (×3): 10 mg via ORAL
  Filled 2013-04-17 (×3): qty 1

## 2013-04-17 MED ORDER — ENOXAPARIN SODIUM 40 MG/0.4ML ~~LOC~~ SOLN
40.0000 mg | Freq: Every day | SUBCUTANEOUS | Status: DC
  Administered 2013-04-17 – 2013-04-18 (×2): 40 mg via SUBCUTANEOUS
  Filled 2013-04-17 (×2): qty 0.4

## 2013-04-17 MED ORDER — ACETAMINOPHEN 650 MG RE SUPP: 650.0000 mg | Freq: Four times a day (QID) | RECTAL | Status: DC | PRN

## 2013-04-17 MED ORDER — TRAMADOL HCL 50 MG PO TABS
100.0000 mg | ORAL_TABLET | Freq: Four times a day (QID) | ORAL | Status: DC
  Administered 2013-04-17 – 2013-04-19 (×8): 100 mg via ORAL
  Filled 2013-04-17 (×12): qty 2

## 2013-04-17 MED ORDER — POLYETHYLENE GLYCOL 3350 17 G PO PACK
17.0000 g | PACK | Freq: Every day | ORAL | Status: DC
  Administered 2013-04-17 – 2013-04-18 (×2): 17 g via ORAL
  Filled 2013-04-17 (×2): qty 1

## 2013-04-17 MED ORDER — SODIUM CHLORIDE 0.9 % IJ SOLN
3.0000 mL | Freq: Two times a day (BID) | INTRAMUSCULAR | Status: DC
  Administered 2013-04-17 – 2013-04-19 (×3): 3 mL via INTRAVENOUS

## 2013-04-17 MED ORDER — ALPRAZOLAM 0.25 MG PO TABS
0.2500 mg | ORAL_TABLET | Freq: Every evening | ORAL | Status: DC | PRN
Start: 2013-04-17 — End: 2013-04-17

## 2013-04-17 NOTE — H&P (Signed)
[facility] History and Physical  [name] [name] ZOX:[id number] DOB: August 28, 1961 DOA: 04/16/2013  Referring physician: ER physician Dr. Preston Fleeting. PCP: Raliegh Ip, MD  Specialists: Dr. Ezzard Standing surgeon.  Chief Complaint: Urticaria.  HPI: [name] [name] is a 52 y.o. female who just had recent bilateral mastectomy a week and half ago started noticing urticarial rash initially known in the right upper extremity which gradually spread all over both upper extremities and then involved the lower extremities and it was associated with itching. Patient also was having wheezing since morning with cough which also worsened. Patient was brought to the ER was found to be tachycardic and initially Solu-Medrol Pepcid and Benadryl was given which did not improve the rash and has epinephrine given at that time it improved. Patient was found to be having significant leukocytosis with lactic acidosis. Patient is not hypotensive and presently on my exam patient is rash has essentially resolved. Patient states that on the new medications patient has been on are hydrocodone and Zofran. Patient denies any nausea vomiting abdominal pain chest pain fever chills diarrhea. Patient's tachycardia has significantly improved. Patient will be admitted for further observation and management. Patient still feels fatigued. Due to the shortness of breath ER physician did order a CT angiogram of the chest which does not show anything acute.  Review of Systems: As presented in the history of presenting illness, rest negative.  Past Medical History  Diagnosis Date  . Hypertension   . Anxiety   . Depression   . Headache   . Sleep apnea     cpcp  . Shortness of breath   . Breast cancer    Past Surgical History  Procedure Laterality Date  . Mastectomy, radical Right 04/08/2013  . Simple mastectomy with axillary sentinel node biopsy Left 04/08/2013  . Mastectomy modified radical Right 04/07/2013    Procedure: RIGHT MASTECTOMY  MODIFIED RADICAL;  Surgeon: Kandis Cocking, MD;  Location: [facility] OR;  Service: General;  Laterality: Right;  . Simple mastectomy with axillary sentinel node biopsy Left 04/07/2013    Procedure:  LEFT SMPLE MASTECTOMY WITH AXILLARY SENTINEL NODE BIOPSY;  Surgeon: Kandis Cocking, MD;  Location: [facility] OR;  Service: General;  Laterality: Left;  . Portacath placement Left 04/07/2013    Procedure: INSERTION PORT-A-CATH;  Surgeon: Kandis Cocking, MD;  Location: [facility] OR;  Service: General;  Laterality: Left;   Social History:  reports that she has never smoked. She has never used smokeless tobacco. She reports that she uses illicit drugs (Marijuana). She reports that she does not drink alcohol. Lives at home. where does patient live-- Can do ADLs. Can patient participate in ADLs?  Allergies  Allergen Reactions  . Codeine Nausea And Vomiting  . Penicillins Rash  . Percocet (Oxycodone-Acetaminophen) Itching  . Septra (Bactrim) Other (See Comments)    Heart races    Family History  Problem Relation Age of Onset  . Breast cancer Mother 74  . Breast cancer Maternal Grandmother 37  . Prostate cancer Paternal Grandfather 51      Prior to Admission medications   Medication Sig Start Date End Date Taking? Authorizing Provider  acetaminophen (TYLENOL) 500 MG tablet Take 1,000 mg by mouth 2 (two) times daily as needed. For pain   Yes Historical Provider, MD  ALPRAZolam (XANAX) 0.25 MG tablet Take 1 tablet by mouth Three times a day. 11/10/11  Yes Historical Provider, MD  escitalopram (LEXAPRO) 10 MG tablet Take 1 tablet by mouth daily. 11/01/11  Yes  Historical Provider, MD  HYDROcodone-acetaminophen (NORCO/VICODIN) 5-325 MG per tablet Take 1 tablet by mouth every 4 (four) hours as needed for pain.   Yes Historical Provider, MD  meloxicam (MOBIC) 15 MG tablet Take 1 tablet by mouth daily. 10/20/11  Yes Historical Provider, MD  ondansetron (ZOFRAN) 4 MG tablet Take 4 mg by mouth every 8 (eight) hours as needed for  nausea.   Yes Historical Provider, MD  triamterene-hydrochlorothiazide (MAXZIDE-25) 37.5-25 MG per tablet Take 1 tablet by mouth daily. 11/09/11  Yes Historical Provider, MD   Physical Exam: Filed Vitals:   04/16/13 2315 04/16/13 2330 04/16/13 2339 04/17/13 0216  BP: 118/71 134/79  134/62  Pulse: 93 97  87  Temp:   98.2 F (36.8 C) 98.9 F (37.2 C)  TempSrc:   Oral Oral  Resp: 17 19    SpO2: 99% 99%  99%     General:  Well-developed and nourished.  Eyes: Anicteric no pallor.  ENT: No ulcers or rash in the mouth. No discharge from the ears eyes nose or mouth.  Neck: No mass felt.  Cardiovascular: S1-S2 heard.  Respiratory: No rhonchi or crepitations.  Abdomen: Soft nontender bowel sounds present.  Skin:  Presently I do not see any rash.  Musculoskeletal: No edema.  Psychiatric: Appears normal.  Neurologic: Alert oriented to time place and person. Moves all extremities.  Labs on Admission:  Basic Metabolic Panel:  Recent Labs Lab 04/16/13 2207  NA 137  K 4.2  CL 95*  CO2 24  GLUCOSE 191*  BUN 14  CREATININE 1.43*  CALCIUM 9.8   Liver Function Tests: No results found for this basename: AST, ALT, ALKPHOS, BILITOT, PROT, ALBUMIN,  in the last 168 hours No results found for this basename: LIPASE, AMYLASE,  in the last 168 hours No results found for this basename: AMMONIA,  in the last 168 hours CBC:  Recent Labs Lab 04/16/13 2207  WBC 32.0*  NEUTROABS 28.5*  HGB 15.8*  HCT 45.2  MCV 89.2  PLT 462*   Cardiac Enzymes:  Recent Labs Lab 04/16/13 2207  TROPONINI <0.30    BNP (last 3 results) No results found for this basename: PROBNP,  in the last 8760 hours CBG: No results found for this basename: GLUCAP,  in the last 168 hours  Radiological Exams on Admission: Ct Angio Chest W/cm &/or Wo Cm  04/17/2013   *RADIOLOGY REPORT*  Clinical Data: Chest pain. Recent mastectomy.  CT ANGIOGRAPHY CHEST  Technique:  Multidetector CT imaging of the chest  using the standard protocol during bolus administration of intravenous contrast. Multiplanar reconstructed images including MIPs were obtained and reviewed to evaluate the vascular anatomy.  Contrast: OMNIPAQUE IOHEXOL 350 MG/ML SOLN  Comparison: 04/14/2013.  Findings: The chest wall demonstrates a recent surgical changes from bilateral mastectomies.  Expected postoperative findings.  A left-sided Port-A-Cath is noted.  The bony thorax is intact.  No destructive bone lesions or spinal canal compromise.  Degenerative changes are noted throughout the thoracic spine.  The heart is normal in size.  No pericardial effusion.  No mediastinal or hilar lymphadenopathy.  The esophagus is grossly normal.  The aorta is normal in caliber.  No dissection.  The pulmonary arterial tree is well opacified.  No filling defects to suggest pulmonary emboli.  Examination of the lung parenchyma demonstrates no acute pulmonary findings other than dependent bibasilar atelectasis.  No infiltrates, edema or effusions.  No worrisome pulmonary nodules or masses.  No bronchiectasis.  The upper abdomen  is unremarkable.  IMPRESSION:  1.  Expected surgical changes from bilateral mastectomies. 2.  No CT findings for pulmonary embolism. 3.  Normal thoracic aorta. 4.  No acute pulmonary findings.   Original Report Authenticated By: Rudie Meyer, M.D.   Dg Chest Portable 1 View  04/16/2013   *RADIOLOGY REPORT*  Clinical Data: Shortness of breath.  PORTABLE CHEST - 1 VIEW  Comparison: 04/07/2013.  Findings: The cardiac silhouette, mediastinal and hilar contours are normal.  The left subclavian power port is stable.  The lungs are clear.  No pleural effusion.  No pneumothorax.  IMPRESSION: No acute cardiopulmonary findings.   Original Report Authenticated By: Rudie Meyer, M.D.    EKG: Independently reviewed. Sinus tachycardia.  Assessment/Plan Principal Problem:   Allergic reaction Active Problems:   Obstructive sleep apnea   Cancer  of upper-outer quadrant of female breast   Leucocytosis   ARF (acute renal failure)   HTN (hypertension)   1. Acute allergic urticarial reaction - causes not clear. There are no oral lesions. We will hold off hydrocodone. Patient states he has not had any new type of food. Denies any insect bites. Continue with Solu-Medrol Pepcid and Benadryl when necessary. Closely observe in step down overnight. 2. Significant leukocytosis - patient is afebrile. Blood cultures have been ordered. Check UA. Leukocytosis may be just reactionary. Closely follow CBC trend. 3. Lactic acidosis - probably patient had transient hypotensive spell from the allergic reaction. Continue hydration. Recheck lactic acid in a.m. 4. History of hypertension - due to increased lactic acid levels and presently patient on hydration we will hold off patient's diuretics for now. 5. Acute renal failure - probably secondary to dehydration. Check UA. Closely follow intake output and metabolic panel. 6. History of OSA on CPAP - CPAP respiratory. 7. Recent bilateral mastectomy for cancer breast - may let surgery know in a.m. of patient's admission.    Code Status: Full code.  Family Communication: Patient's daughter at the bedside.  Disposition Plan: Admit to inpatient.    Danasha Melman N. [facility] Pager [phone].  If 7PM-7AM, please contact night-coverage [email or web address] Password [facility] 04/17/2013, 2:23 AM

## 2013-04-17 NOTE — Care Management Note (Signed)
    Page 1 of 1   04/17/2013     10:38:53 AM   CARE MANAGEMENT NOTE 04/17/2013  Patient:  INFINITI, HOEFLING   Account Number:  [id number]  Date Initiated:  04/17/2013  Documentation initiated by:  Junius Creamer  Subjective/Objective Assessment:   adm w allergic reaction     Action/Plan:   lives w fam, pcp dr Leonette Most phillips   Anticipated DC Date:     Anticipated DC Plan:        DC Planning Services  CM consult      Choice offered to / List presented to:             Status of service:   Medicare Important Message given?   (If response is "NO", the following Medicare IM given date fields will be blank) Date Medicare IM given:   Date Additional Medicare IM given:    Discharge Disposition:    Per UR Regulation:  Reviewed for med. necessity/level of care/duration of stay  If discussed at Long Length of Stay Meetings, dates discussed:    Comments:

## 2013-04-17 NOTE — Progress Notes (Signed)
[facility] Progress Note [place] TEAM 1 - Stepdown/ICU TEAM   [name] [name] FAO:[id number] DOB: 1961-02-10 DOA: 04/16/2013 PCP: Raliegh Ip, MD  Brief narrative: [name] [name] is a 52 y.o. female who just had recent bilateral mastectomy a week and half ago started noticing urticarial rash initially known in the right upper extremity which gradually spread all over both upper extremities and then involved the lower extremities and it was associated with itching. Patient also was having wheezing since morning with cough which also worsened. Patient was brought to the ER was found to be tachycardic and initially Solu-Medrol Pepcid and Benadryl was given which did not improve the rash and has epinephrine given at that time it improved. Patient was found to be having significant leukocytosis with lactic acidosis. Patient is not hypotensive and presently on my exam patient is rash has essentially resolved. Patient states that on the new medications patient has been on are hydrocodone and Zofran. Patient denies any nausea vomiting abdominal pain chest pain fever chills diarrhea. Patient's tachycardia has significantly improved. Patient will be admitted for further observation and management. Patient still feels fatigued. Due to the shortness of breath ER physician did order a CT angiogram of the chest which does not show anything acute.   Assessment/Plan: Principal Problem:   Allergic reaction - at this time cause is uncertain although pt believes it to be Vicodin - due to recurrence of hives this AM, will cont to follow on current dose of steroids, Pepcid and PRN Benadryl  Active Problems:   Obstructive sleep apnea - CPAP at bedtime    Cancer of upper-outer quadrant of female breast - start Tramadol for pain - outpt f/u with Dr Ezzard Standing    Leucocytosis - likely due to allergic reaction and then steroids -no source of infection found    ARF (acute renal failure) - improving - cut  NS from 125 to 75 and follow      HTN (hypertension) - cont to hold diuretics for tomorrow  Constipation -Miralax and PRN Dulcolax    Code Status: full code Family Communication: daughter Disposition Plan: transfer out of SDU  Consultants: none  Procedures: none  Antibiotics: none  DVT prophylaxis: Lovenox  HPI/Subjective: Recurrence of hives this AM- resolved after pt received Benadryl. Admits to being constipated. Believes that Vicodin is what she is allergic to and asking for another pain medication. Have discussed started Tramadol which she is agreeable to. States she saw Dr Ezzard Standing yesterday to have some staples removed.    Objective: Blood pressure 135/52, pulse 84, temperature 98.5 F (36.9 C), temperature source Oral, resp. rate 17, height 5\' 2"  (1.575 m), weight 99.8 kg (220 lb 0.3 oz), last menstrual period 03/04/2013, SpO2 98.00%.  Intake/Output Summary (Last 24 hours) at 04/17/13 1704 Last data filed at 04/17/13 1700  Gross per 24 hour  Intake 2649.17 ml  Output     21 ml  Net 2628.17 ml     Exam: General: No acute respiratory distress Lungs: Clear to auscultation bilaterally without wheezes or crackles Cardiovascular: Regular rate and rhythm without murmur gallop or rub normal S1 and S2 Abdomen: Nontender, nondistended, soft, bowel sounds positive, no rebound, no ascites, no appreciable mass Extremities: No significant cyanosis, clubbing, or edema bilateral lower extremities Skin: no rash  Data Reviewed: Basic Metabolic Panel:  Recent Labs Lab 04/16/13 2207 04/17/13 0741  NA 137 135  K 4.2 4.0  CL 95* 98  CO2 24 21  GLUCOSE 191* 177*  BUN 14 14  CREATININE 1.43* 1.11*  CALCIUM 9.8 8.8   Liver Function Tests:  Recent Labs Lab 04/17/13 0741  AST 23  ALT 26  ALKPHOS 63  BILITOT 0.2*  PROT 6.7  ALBUMIN 3.0*   No results found for this basename: LIPASE, AMYLASE,  in the last 168 hours No results found for this basename: AMMONIA,   in the last 168 hours CBC:  Recent Labs Lab 04/16/13 2207 04/17/13 0741  WBC 32.0* 24.1*  NEUTROABS 28.5* 22.3*  HGB 15.8* 12.4  HCT 45.2 36.8  MCV 89.2 88.7  PLT 462* 316   Cardiac Enzymes:  Recent Labs Lab 04/16/13 2207  TROPONINI <0.30   BNP (last 3 results) No results found for this basename: PROBNP,  in the last 8760 hours CBG: No results found for this basename: GLUCAP,  in the last 168 hours  No results found for this or any previous visit (from the past 240 hour(s)).   Studies:  Recent x-ray studies have been reviewed in detail by the Attending Physician  Scheduled Meds:  Scheduled Meds: . ALPRAZolam  0.25 mg Oral TID  . enoxaparin (LOVENOX) injection  40 mg Subcutaneous Daily  . escitalopram  10 mg Oral Daily  . famotidine (PEPCID) IV  20 mg Intravenous Q12H  . methylPREDNISolone (SOLU-MEDROL) injection  40 mg Intravenous Daily  . polyethylene glycol  17 g Oral Daily  . sodium chloride  3 mL Intravenous Q12H  . traMADol  100 mg Oral QID   Continuous Infusions: . sodium chloride      Time spent on care of this patient: 35 min   Calvert Cantor, MD [phone]  [facility] Office  [phone] Pager - Text Page per Amion as per below:  On-Call/Text Page:      [email or web address]      password [email or web address]  If 7PM-7AM, please contact night-coverage [email or web address] Password [email or web address] 04/17/2013, 5:04 PM   LOS: 1 day

## 2013-04-17 NOTE — Progress Notes (Signed)
On admission to unit around 3 am patient only had a few red bumps on neck and face. At 0600 am patient is starting to develop a few more rash areas on rt. Hand and lt. Arm. Patient says that they itch.

## 2013-04-17 NOTE — Progress Notes (Signed)
Notified Dr. Toniann Fail that lab was unable to get labs on patient due to the arm restrictions on bilateral upper extremeties. Pt. Did not want to be stuck in the foot. OK to use PAC left chest. IV therapy notified of need for them to access left chest PAC.

## 2013-04-17 NOTE — Progress Notes (Signed)
Placed pt. On cpap as per order and per pt. Home settings. Pt. Is tolerating well at this time.

## 2013-04-18 ENCOUNTER — Ambulatory Visit: Payer: PRIVATE HEALTH INSURANCE

## 2013-04-18 LAB — BASIC METABOLIC PANEL
BUN: 13 mg/dL (ref 6–23)
CO2: 25 mEq/L (ref 19–32)
Calcium: 8.5 mg/dL (ref 8.4–10.5)
Creatinine, Ser: 0.86 mg/dL (ref 0.50–1.10)
GFR calc non Af Amer: 78 mL/min — ABNORMAL LOW (ref >90)
Glucose, Bld: 118 mg/dL — ABNORMAL HIGH (ref 70–99)

## 2013-04-18 LAB — URINALYSIS, ROUTINE W REFLEX MICROSCOPIC
Glucose, UA: NEGATIVE mg/dL
Leukocytes, UA: NEGATIVE
pH: 6.5 (ref 5.0–8.0)

## 2013-04-18 LAB — CBC
HCT: 29.3 % — ABNORMAL LOW (ref 36.0–46.0)
Hemoglobin: 9.7 g/dL — ABNORMAL LOW (ref 12.0–15.0)
MCH: 29.9 pg (ref 26.0–34.0)
MCHC: 33.1 g/dL (ref 30.0–36.0)
MCV: 90.4 fL (ref 78.0–100.0)
RBC: 3.24 MIL/uL — ABNORMAL LOW (ref 3.87–5.11)

## 2013-04-18 LAB — URINE MICROSCOPIC-ADD ON

## 2013-04-18 MED ORDER — PREDNISONE 20 MG PO TABS
20.0000 mg | ORAL_TABLET | Freq: Every day | ORAL | Status: DC
  Administered 2013-04-19: 20 mg via ORAL
  Filled 2013-04-18 (×2): qty 1

## 2013-04-18 MED ORDER — ACETAMINOPHEN 325 MG PO TABS
650.0000 mg | ORAL_TABLET | Freq: Four times a day (QID) | ORAL | Status: DC
  Administered 2013-04-18 – 2013-04-19 (×6): 650 mg via ORAL
  Filled 2013-04-18 (×2): qty 2

## 2013-04-18 MED ORDER — DIPHENHYDRAMINE HCL 25 MG PO CAPS
25.0000 mg | ORAL_CAPSULE | Freq: Three times a day (TID) | ORAL | Status: DC
  Administered 2013-04-18 – 2013-04-19 (×5): 25 mg via ORAL
  Filled 2013-04-18 (×7): qty 1

## 2013-04-18 MED ORDER — POTASSIUM CHLORIDE CRYS ER 20 MEQ PO TBCR
40.0000 meq | EXTENDED_RELEASE_TABLET | Freq: Once | ORAL | Status: AC
  Administered 2013-04-18: 40 meq via ORAL
  Filled 2013-04-18: qty 2

## 2013-04-18 MED ORDER — SODIUM CHLORIDE 0.9 % IV SOLN
INTRAVENOUS | Status: AC
  Administered 2013-04-18: 10 mL/h via INTRAVENOUS
  Administered 2013-04-18: 20 mL/h via INTRAVENOUS

## 2013-04-18 MED ORDER — HYDROCORTISONE 2.5 % RE CREA
TOPICAL_CREAM | Freq: Two times a day (BID) | RECTAL | Status: DC
  Administered 2013-04-18 – 2013-04-19 (×2): via RECTAL
  Filled 2013-04-18: qty 28.35

## 2013-04-18 NOTE — Progress Notes (Signed)
Patient refuse dressing change at this time, c/o of rectal pain 10/10.

## 2013-04-18 NOTE — Progress Notes (Signed)
[facility] Progress Note    [name] [name] ZOX:[id number] DOB: [birth year] DOA: 04/16/2013 PCP: [name] Ip, MD  Brief narrative: [name] [name] is a 52 y.o. female who just had recent bilateral mastectomy a week and half ago started noticing urticarial rash initially known in the right upper extremity which gradually spread all over both upper extremities and then involved the lower extremities and it was associated with itching. Patient also was having wheezing since morning with cough which also worsened. Patient was brought to the ER was found to be tachycardic and initially Solu-Medrol Pepcid and Benadryl was given which did not improve the rash and has epinephrine given at that time it improved. Patient was found to be having significant leukocytosis with lactic acidosis. Patient is not hypotensive and presently on my exam patient is rash has essentially resolved. Patient states that on the new medications patient has been on are hydrocodone and Zofran. Patient denies any nausea vomiting abdominal pain chest pain fever chills diarrhea. Patient's tachycardia has significantly improved. Patient will be admitted for further observation and management. Patient still feels fatigued. Due to the shortness of breath ER physician did order a CT angiogram of the chest which does not show anything acute.   Assessment/Plan:    Allergic reaction - at this time cause is uncertain although patient believes it to be Vicodin - patient did receive iv steroids and iv benadryl on 5/22 - we did change to po prednisone and po benadryl on 5/23     Obstructive sleep apnea - CPAP at bedtime    Cancer of upper-outer quadrant of female breast - start Tramadol for pain - outpt f/u with [name] [name]    Leucocytosis - likely due to allergic reaction and then steroids -no source of infection found - continue to follow trend     ARF (acute renal failure) - resolved with iv fluids      HTN  (hypertension) - cont to hold diuretics for tomorrow  Constipation -Miralax and PRN Dulcolax - resolved   Rectal pain  anusol HC  Hypokalemia  Replete   Anemia - significant Hb drop - suspect pure blood loss from surgery - suspect initial Hb was high due to hemoconcentration - will recheck CBc tomorrow   Code Status: full code Family Communication: daughter Disposition Plan: home   Consultants: none  Procedures: none  Antibiotics: none  DVT prophylaxis: Lovenox  HPI/Subjective: No further rash,  Now with rectal pain after multiple loose BMs   Objective: Blood pressure 154/53, pulse 73, temperature 98.9 F (37.2 C), temperature source Oral, resp. rate 17, height 5\' 2"  (1.575 m), weight 101.424 kg (223 lb 9.6 oz), last menstrual period 03/04/2013, SpO2 100.00%.  Intake/Output Summary (Last 24 hours) at 04/18/13 1531 Last data filed at 04/18/13 1300  Gross per 24 hour  Intake  754.5 ml  Output     60 ml  Net  694.5 ml   Patient Vitals for the past 24 hrs:  BP Temp Temp src Pulse Resp SpO2 Height Weight  04/18/13 0930 154/53 mmHg 98.9 F (37.2 C) Oral 73 17 100 % - -  04/18/13 0432 134/65 mmHg 97.7 F (36.5 C) Oral 103 16 99 % - -  04/17/13 2100 134/53 mmHg 98.6 F (37 C) Oral 84 16 97 % 5\' 2"  (1.575 m) 101.424 kg (223 lb 9.6 oz)  04/17/13 2000 - 98.1 F (36.7 C) Axillary - 11 97 % - -  04/17/13 1935 149/66 mmHg - - - 14 - - -  04/17/13 1629 135/52 mmHg 98.5 F (36.9 C) Oral 84 13 98 % - -      Exam: General: No acute respiratory distress Lungs: Clear to auscultation bilaterally without wheezes or crackles Cardiovascular: Regular rate and rhythm without murmur gallop or rub normal S1 and S2 Abdomen: Nontender, nondistended, soft, bowel sounds positive, no rebound, no ascites, no appreciable mass Extremities: No significant cyanosis, clubbing, or edema bilateral lower extremities Skin: no rash  Data Reviewed: Basic Metabolic Panel:  Recent  Labs Lab 04/16/13 2207 04/17/13 0741 04/18/13 0522  NA 137 135 139  K 4.2 4.0 3.3*  CL 95* 98 105  CO2 24 21 25   GLUCOSE 191* 177* 118*  BUN 14 14 13   CREATININE 1.43* 1.11* 0.86  CALCIUM 9.8 8.8 8.5   Liver Function Tests:  Recent Labs Lab 04/17/13 0741  AST 23  ALT 26  ALKPHOS 63  BILITOT 0.2*  PROT 6.7  ALBUMIN 3.0*   No results found for this basename: LIPASE, AMYLASE,  in the last 168 hours No results found for this basename: AMMONIA,  in the last 168 hours CBC:  Recent Labs Lab 04/16/13 2207 04/17/13 0741 04/18/13 0522  WBC 32.0* 24.1* 23.0*  NEUTROABS 28.5* 22.3*  --   HGB 15.8* 12.4 9.7*  HCT 45.2 36.8 29.3*  MCV 89.2 88.7 90.4  PLT 462* 316 239   Cardiac Enzymes:  Recent Labs Lab 04/16/13 2207  TROPONINI <0.30   BNP (last 3 results) No results found for this basename: PROBNP,  in the last 8760 hours CBG: No results found for this basename: GLUCAP,  in the last 168 hours  Recent Results (from the past 240 hour(s))  CULTURE, BLOOD (ROUTINE X 2)     Status: None   Collection Time    04/17/13  2:03 AM      Result Value Range Status   Specimen Description BLOOD RIGHT HAND   Final   Special Requests BOTTLES DRAWN AEROBIC ONLY 10CC   Final   Culture  Setup Time 04/17/2013 08:54   Final   Culture     Final   Value:        BLOOD CULTURE RECEIVED NO GROWTH TO DATE CULTURE WILL BE HELD FOR 5 DAYS BEFORE ISSUING A FINAL NEGATIVE REPORT   Report Status PENDING   Incomplete  CULTURE, BLOOD (ROUTINE X 2)     Status: None   Collection Time    04/17/13  2:10 AM      Result Value Range Status   Specimen Description BLOOD RIGHT ARM   Final   Special Requests BOTTLES DRAWN AEROBIC ONLY 5CC   Final   Culture  Setup Time 04/17/2013 08:53   Final   Culture     Final   Value:        BLOOD CULTURE RECEIVED NO GROWTH TO DATE CULTURE WILL BE HELD FOR 5 DAYS BEFORE ISSUING A FINAL NEGATIVE REPORT   Report Status PENDING   Incomplete     Studies:  Recent  x-ray studies have been reviewed in detail by the Attending Physician  Scheduled Meds:  Scheduled Meds: . acetaminophen  650 mg Oral QID  . ALPRAZolam  0.25 mg Oral TID  . diphenhydrAMINE  25 mg Oral TID  . escitalopram  10 mg Oral Daily  . famotidine (PEPCID) IV  20 mg Intravenous Q12H  . hydrocortisone   Rectal BID  . [START ON 04/19/2013] predniSONE  20 mg Oral Q breakfast  . sodium chloride  3 mL Intravenous Q12H  . traMADol  100 mg Oral QID   Continuous Infusions: . sodium chloride 20 mL/hr (04/18/13 1140)     Deran Barro, MD [phone]  If 7PM-7AM, please contact night-coverage [email or web address] Password [facility] 04/18/2013, 3:31 PM   LOS: 2 days

## 2013-04-18 NOTE — Progress Notes (Signed)
Dr. Lavera Guise notified patient's K+ level is 3.3

## 2013-04-19 ENCOUNTER — Inpatient Hospital Stay (HOSPITAL_COMMUNITY): Payer: Medicaid Other

## 2013-04-19 DIAGNOSIS — K6289 Other specified diseases of anus and rectum: Secondary | ICD-10-CM

## 2013-04-19 DIAGNOSIS — C50419 Malignant neoplasm of upper-outer quadrant of unspecified female breast: Secondary | ICD-10-CM

## 2013-04-19 LAB — BASIC METABOLIC PANEL
BUN: 14 mg/dL (ref 6–23)
CO2: 24 mEq/L (ref 19–32)
Chloride: 106 mEq/L (ref 96–112)
GFR calc Af Amer: >90 mL/min (ref >90)
Glucose, Bld: 110 mg/dL — ABNORMAL HIGH (ref 70–99)
Potassium: 3.8 mEq/L (ref 3.5–5.1)

## 2013-04-19 LAB — CBC
HCT: 30.6 % — ABNORMAL LOW (ref 36.0–46.0)
Hemoglobin: 10.1 g/dL — ABNORMAL LOW (ref 12.0–15.0)
MCHC: 33 g/dL (ref 30.0–36.0)
MCV: 91.1 fL (ref 78.0–100.0)

## 2013-04-19 MED ORDER — DIPHENHYDRAMINE HCL 25 MG PO CAPS: 25.0000 mg | ORAL_CAPSULE | Freq: Three times a day (TID) | ORAL | Status: DC

## 2013-04-19 MED ORDER — DOCUSATE SODIUM 100 MG PO CAPS
200.0000 mg | ORAL_CAPSULE | Freq: Two times a day (BID) | ORAL | Status: DC
  Administered 2013-04-19: 200 mg via ORAL
  Filled 2013-04-19: qty 1

## 2013-04-19 MED ORDER — DSS 100 MG PO CAPS: 200.0000 mg | ORAL_CAPSULE | Freq: Two times a day (BID) | ORAL | Status: DC

## 2013-04-19 MED ORDER — HYDROCORTISONE ACETATE 25 MG RE SUPP: 25.0000 mg | Freq: Two times a day (BID) | RECTAL | Status: DC

## 2013-04-19 MED ORDER — HYDROCORTISONE ACETATE 25 MG RE SUPP
25.0000 mg | Freq: Two times a day (BID) | RECTAL | Status: DC
  Administered 2013-04-19: 25 mg via RECTAL
  Filled 2013-04-19 (×2): qty 1

## 2013-04-19 MED ORDER — TRAMADOL HCL 50 MG PO TABS: 50.0000 mg | ORAL_TABLET | Freq: Three times a day (TID) | ORAL | Status: DC | PRN

## 2013-04-19 MED ORDER — SODIUM CHLORIDE 0.9 % IJ SOLN
10.0000 mL | INTRAMUSCULAR | Status: DC | PRN
  Administered 2013-04-19: 20 mL
  Administered 2013-04-19: 10 mL

## 2013-04-19 MED ORDER — HEPARIN SOD (PORK) LOCK FLUSH 100 UNIT/ML IV SOLN
500.0000 [IU] | INTRAVENOUS | Status: AC | PRN
  Administered 2013-04-19: 500 [IU]

## 2013-04-19 NOTE — Progress Notes (Signed)
Resp Care Note; Pt has home CPAP unit,states she is not ready to go on it yet,says she will put it on herself when ready.

## 2013-04-19 NOTE — Discharge Summary (Signed)
Physician Discharge Summary  [name] [name]:[id number] DOB: [birth year] DOA: 04/16/2013  PCP: [name] Ip, MD  Admit date: 04/16/2013 Discharge date: 04/19/2013  Time spent: 35 minutes  1. It needs complete blood counts at followup visit to assure leukocytosis has resolved  Discharge Diagnoses:  Allergic reaction to Vicodin-resolved   Obstructive sleep apnea   Cancer of upper-outer quadrant of female breast   Leucocytosis -improving   ARF (acute renal failure) - resolved   HTN (hypertension)   Rectal pain probably from hemorrhoids Constipation resolved   Discharge Condition:  good  Diet recommendation: Regular diet  Filed Weights   04/17/13 0500 04/17/13 2100 04/18/13 2028  Weight: 99.8 kg (220 lb 0.3 oz) 101.424 kg (223 lb 9.6 oz) 101.424 kg (223 lb 9.6 oz)    History of present illness:  [name] [name] is a 52 y.o. female who just had recent bilateral mastectomy a week and half ago started noticing urticarial rash initially known in the right upper extremity which gradually spread all over both upper extremities and then involved the lower extremities and it was associated with itching. Patient also was having wheezing since morning with cough which also worsened. Patient was brought to the ER was found to be tachycardic and initially Solu-Medrol Pepcid and Benadryl was given which did not improve the rash and has epinephrine given at that time it improved. Patient was found to be having significant leukocytosis with lactic acidosis. Patient is not hypotensive and presently on my exam patient is rash has essentially resolved. Patient states that on the new medications patient has been on are hydrocodone and Zofran. Patient denies any nausea vomiting abdominal pain chest pain fever chills diarrhea. Patient's tachycardia has significantly improved. Patient will be admitted for further observation and management. Patient still feels fatigued. Due to the shortness of breath ER  physician did order a CT angiogram of the chest which does not show anything acute.   Hospital Course:  Allergic reaction  - We suspected was related to Vicodin - patient did receive iv steroids and iv benadryl on 5/22 - we did change to po prednisone and po benadryl on 5/23  By May 24 the patient has improved and she was discharged home on oral Benadryl We did not continue oral prednisone as she was having significant leukocytosis Obstructive sleep apnea  - CPAP at bedtime  Cancer of upper-outer quadrant of female breast - status post recent bilateral mastectomy - started on  Tramadol for pain  - outpt f/u with Dr Ezzard Standing  Leucocytosis  - likely due to allergic reaction and then steroids  -no source of infection found  - continue to follow trend  - She has an outpatient visit with Dr. Ezzard Standing this week at which time we would recommend repeating the CBC ARF (acute renal failure)  - resolved with iv fluids  HTN (hypertension)  - cont to hold diuretics  Constipation  -The patient did receive Miralax with great improvement in her symptoms Repeat abdominal x-ray indicated remaining stool in the right colon for which we did prescribe Colace Rectal pain  Suspected from hemorrhoids - we did prescribe Anusol HC Hypokalemia  Replete     Procedures: None Consultations:  None  Discharge Exam: Filed Vitals:   04/18/13 2028 04/19/13 0440 04/19/13 0933 04/19/13 1346  BP: 158/94 140/63 142/81 156/85  Pulse: 79 73 81 91  Temp: 97.4 F (36.3 C) 98 F (36.7 C) 98.4 F (36.9 C) 98.7 F (37.1 C)  TempSrc: Oral Oral  Oral Oral  Resp: 16 18 18 18   Height: 5\' 2"  (1.575 m)     Weight: 101.424 kg (223 lb 9.6 oz)     SpO2: 100% 100% 100% 100%    General: Alert and oriented x3 Cardiovascular: Regular rate and rhythm Respiratory: Clear to auscultation bilaterally  Discharge Instructions  Discharge Orders   Future Appointments Provider Department Dept Phone   04/23/2013 3:15 PM  Currie Paris, MD [facility], [place] [phone]   04/25/2013 9:30 AM [facility] [phone]   04/25/2013 10:00 AM Lowella Dell, MD [facility] MEDICAL ONCOLOGY [phone]   04/29/2013 12:00 PM [facility] [phone]   Future Orders Complete By Expires     Diet general  As directed     Increase activity slowly  As directed         Medication List    STOP taking these medications       HYDROcodone-acetaminophen 5-325 MG per tablet  Commonly known as:  NORCO/VICODIN     meloxicam 15 MG tablet  Commonly known as:  MOBIC     triamterene-hydrochlorothiazide 37.5-25 MG per tablet  Commonly known as:  MAXZIDE-25      TAKE these medications       acetaminophen 500 MG tablet  Commonly known as:  TYLENOL  Take 1,000 mg by mouth 2 (two) times daily as needed. For pain     ALPRAZolam 0.25 MG tablet  Commonly known as:  XANAX  Take 1 tablet by mouth Three times a day.     diphenhydrAMINE 25 mg capsule  Commonly known as:  BENADRYL  Take 1 capsule (25 mg total) by mouth 3 (three) times daily.     DSS 100 MG Caps  Take 200 mg by mouth 2 (two) times daily.     escitalopram 10 MG tablet  Commonly known as:  LEXAPRO  Take 1 tablet by mouth daily.     hydrocortisone 25 MG suppository  Commonly known as:  ANUSOL-HC  Place 1 suppository (25 mg total) rectally 2 (two) times daily.     ondansetron 4 MG tablet  Commonly known as:  ZOFRAN  Take 4 mg by mouth every 8 (eight) hours as needed for nausea.     traMADol 50 MG tablet  Commonly known as:  ULTRAM  Take 1 tablet (50 mg total) by mouth every 8 (eight) hours as needed for pain.       Allergies  Allergen Reactions  . Codeine Nausea And Vomiting  . Penicillins Rash  . Percocet (Oxycodone-Acetaminophen) Itching  . Septra (Bactrim) Other (See Comments)    Heart races       Follow-up  Information   Follow up with [name] Ip, MD.   Contact information:   [address] [place] [address] [phone]        The results of significant diagnostics from this hospitalization (including imaging, microbiology, ancillary and laboratory) are listed below for reference.    Significant Diagnostic Studies: Dg Chest 2 View  04/03/2013   *RADIOLOGY REPORT*  Clinical Data: Preop for bilateral mastectomy  CHEST - 2 VIEW  Comparison: None.  Findings: Cardiomediastinal silhouette is unremarkable.  No acute infiltrate or pleural effusion.  No pulmonary edema.  Degenerative changes thoracic spine.  IMPRESSION: No active disease.  Degenerative changes thoracic spine.   Original Report Authenticated By: Natasha Mead, M.D.   Ct Chest W Contrast  04/04/2013   *RADIOLOGY REPORT*  Clinical Data: Stage III breast cancer  CT CHEST WITH CONTRAST  Technique:  Multidetector CT imaging of the chest was performed following the standard protocol during bolus administration of intravenous contrast.  Contrast: 80mL OMNIPAQUE IOHEXOL 300 MG/ML  SOLN  Comparison: None  Findings: Right breast mass is again noted within the periphery of the right breast.  This is better defined on recent MRI.  There are multiple right axillary lymph nodes.  These are hypermetabolic on PET CT from today.  Index node measures 8 mm, image 16/series 2. Adjacent index lymph node measures 7 mm, image 6/series 2.  Left axillary adenopathy.  There is no supraclavicular adenopathy.  The heart size appears normal.  No pericardial effusion.  There is no enlarged mediastinal or hilar lymph nodes.  No evidence for internal mammary adenopathy.  There is no pleural effusion identified.  Several tiny subpleural nodules are identified along the minor fissure.  These measure up to 3 mm, image 32/series 5.  Mild spondylosis identified within the thoracic spine. There is no worrisome lytic or sclerotic bone lesions identified.  Limited imaging through  the upper abdomen low attenuation nodule in the left adrenal gland measures 3.5 HU and 1.2 cm, image 57/series 2.  Likely benign adenoma.  IMPRESSION:  1.  Right breast mass consistent with primary breast carcinoma. 2.  Sub centimeter right axillary lymph nodes are hypermetabolic on PET CT from today and are concerning for metastatic adenopathy. 3.  Small nonspecific nodules along the minor fissure of the right lung. Attention on follow-up imaging advised.   Original Report Authenticated By: Signa Kell, M.D.   Ct Angio Chest W/cm &/or Wo Cm  04/17/2013   *RADIOLOGY REPORT*  Clinical Data: Chest pain. Recent mastectomy.  CT ANGIOGRAPHY CHEST  Technique:  Multidetector CT imaging of the chest using the standard protocol during bolus administration of intravenous contrast. Multiplanar reconstructed images including MIPs were obtained and reviewed to evaluate the vascular anatomy.  Contrast: OMNIPAQUE IOHEXOL 350 MG/ML SOLN  Comparison: 04/14/2013.  Findings: The chest wall demonstrates a recent surgical changes from bilateral mastectomies.  Expected postoperative findings.  A left-sided Port-A-Cath is noted.  The bony thorax is intact.  No destructive bone lesions or spinal canal compromise.  Degenerative changes are noted throughout the thoracic spine.  The heart is normal in size.  No pericardial effusion.  No mediastinal or hilar lymphadenopathy.  The esophagus is grossly normal.  The aorta is normal in caliber.  No dissection.  The pulmonary arterial tree is well opacified.  No filling defects to suggest pulmonary emboli.  Examination of the lung parenchyma demonstrates no acute pulmonary findings other than dependent bibasilar atelectasis.  No infiltrates, edema or effusions.  No worrisome pulmonary nodules or masses.  No bronchiectasis.  The upper abdomen is unremarkable.  IMPRESSION:  1.  Expected surgical changes from bilateral mastectomies. 2.  No CT findings for pulmonary embolism. 3.  Normal  thoracic aorta. 4.  No acute pulmonary findings.   Original Report Authenticated By: Rudie Meyer, M.D.   US Breast Right  03/27/2013   **ADDENDUM** CREATED: 03/27/2013 13:33:25  The Impression should read 3.9 x 2 x 4.1 cm RIGHT BREAST mass.  Addended by:  Elpidio Eric. Si Gaul, M.D. on 03/27/2013 13:33:25.  **END ADDENDUM** SIGNED BY: Tinnie Gens T. Si Gaul, M.D.  03/27/2013   *RADIOLOGY REPORT*  Clinical Data:  52 year old female for annual bilateral mammograms and with palpable right breast mass discovered on self examination.  DIGITAL DIAGNOSTIC BILATERAL MAMMOGRAM WITH CAD AND RIGHT BREAST ULTRASOUND:  Comparison:  12/11/2003  Findings:  ACR Breast Density Category 2: There is a scattered fibroglandular pattern.  A 3.5 x 4 cm irregular mass with distortion within the upper outer right breast is identified.  A few mildly enlarged lower level I right axillary lymph nodes are present. A 3 x 9 mm cluster of slightly heterogeneous calcifications within the posterior lower right breast are indeterminate. There is no evidence of suspicious mass, distortion or worrisome calcifications within the left breast.  Mammographic images were processed with CAD.  On physical exam, a firm palpable mass identified in the upper outer right breast.  Ultrasound is performed, showing a 3.9 x 2 x 4.1 cm irregular hypoechoic mass at the 10 o'clock position of the right breast 6 cm from the nipple. A few mildly enlarged lower level I right axillary/upper outer right breast lymph nodes are present, some which have lost their fatty hilum.  IMPRESSION: 3.9 x 2 x 4.1 cm irregular mass with mildly enlarged lower right axillary/upper outer right breast lymph nodes - highly suspicious for neoplasm with lymphatic spread.  3 x 9 mm cluster of indeterminate calcifications within the posterior lower right breast.  Tissue sampling of above areas is recommended.  BI-RADS CATEGORY 5:  Highly suggestive of malignancy - appropriate action should be taken.   RECOMMENDATION:  Ultrasound guided biopsy of the upper outer right breast mass and lymph node.  Stereotactic guided biopsy of right breast calcifications.  These biopsies will be performed today but dictated in a separate report.  I have discussed the findings and recommendations with the patient. Results were also provided in writing at the conclusion of the visit.   Original Report Authenticated By: Harmon Pier, M.D.   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 Family history of breast cancer in her mother at age 18 and maternal grandmother at age 25.  BILATERAL BREAST MRI WITH AND WITHOUT CONTRAST  Technique: Multiplanar, multisequence MR images of both breasts were obtained prior to and following the intravenous administration of 20 ml of Multihance.  Three dimensional images were evaluated at the independent DynaCad workstation.  Comparison:  No prior MRI.  Mammography 03/27/2013, 12/01/2003.  Findings: Large irregular enhancing mass in the upper outer quadrant of the right breast spanning 10-11 o'clock measuring approximately 5.4 x 3.6 x 3.3 cm, corresponding to the recently biopsied mass.  This mass demonstrates both plateau and washout kinetics.  Oval shaped enhancing satellite nodule immediately inferior and lateral to the dominant mass measuring 0.8 cm maximally, also with plateau and washout kinetics.  Approximate 2.8 cm focus of non mass-like enhancement in the posterior third of the breast at the level of the nipple, corresponding to the biopsy- proven DCIS.  No abnormal enhancement elsewhere in the right breast to suggest other foci of DCIS.  Pathologically enlarged right axillary lymph nodes and right intramammary lymph nodes, including the previously biopsied node. The largest right axillary node measures approximately 2.8 cm in length.  No  pathologic right internal mammary, left internal mammary or left axillary lymphadenopathy.  2 foci of non mass-like enhancement in the left breast, similar to the enhancement pattern of the DCIS in the right breast.  An approximate 1.3 cm focus is present in the lower inner quadrant at 7 o'clock approximately 8 cm from  the nipple.  An approximate 2.2 cm focus is present in the lower outer quadrant at 5 o'clock approximately 12 cm from the nipple.  There is no correlate on the recent mammogram.  IMPRESSION:  1.  Biopsy-proven invasive mammary cancer in the upper outer quadrant of the right breast with maximum measurement approximating 5.4 cm. 2.  0.8 cm satellite nodule immediately inferior and lateral to the dominant mass in the right breast. 3.  Approximate 2.8 cm focus of non mass-like enhancement in the posterior third of the right breast corresponding to the biopsy- proven DCIS. 4.  Pathologic right axillary lymphadenopathy.  No pathologic lymphadenopathy elsewhere. 5.  2 foci of non mass-like enhancement in the left breast, at the 7 o'clock position approximately 8 cm from the nipple and at the 5 o'clock position approximately 12 cm from the nipple, without mammographic correlate.  RECOMMENDATION: MR biopsy of the 2 foci of non mass-like enhancement in the left breast.  THREE-DIMENSIONAL MR IMAGE RENDERING ON INDEPENDENT WORKSTATION:  Three-dimensional MR images were rendered by post-processing of the original MR data on an independent workstation.  The three- dimensional MR images were interpreted, and findings were reported in the accompanying complete MRI report for this study.  BI-RADS CATEGORY 4:  Suspicious abnormality - biopsy should be considered.   Original Report Authenticated By: Hulan Saas, M.D.   Nm Pet Image Initial (pi) Skull Base To Thigh  04/04/2013   *RADIOLOGY REPORT*  Clinical Data: Initial treatment strategy for stage III breast cancer.  NUCLEAR MEDICINE PET SKULL BASE TO THIGH   Fasting Blood Glucose:  96  Technique:  15.1 mCi F-18 FDG was injected intravenously. CT data was obtained and used for attenuation correction and anatomic localization only.  (This was not acquired as a diagnostic CT examination.) Additional exam technical data entered on technologist worksheet.  Comparison:  None  Findings:  Neck: No hypermetabolic lymph nodes in the neck.  Chest:  No hypermetabolic mediastinal or hilar nodes.  No suspicious pulmonary nodules on the CT scan. Right breast mass measures 4.1 cm and has an SUV max equal to 17.30 right axillary lymph nodes are identified.  There is increased FDG uptake associated these lymph nodes.  Index lymph node has an SUV max equal to 6.9, image 72.  Abdomen/Pelvis:  No abnormal hypermetabolic activity within the liver, pancreas, adrenal glands, or spleen.  Low attenuation nodule in the left adrenal gland likely represents a benign adenoma, image 129/series 2.  No hypermetabolic lymph nodes in the abdomen or pelvis.  Skeleton:  No focal hypermetabolic activity to suggest skeletal metastasis.  IMPRESSION:  1.  Mass in the right breast is intensely hypermetabolic consistent with primary breast carcinoma. 2.  Hypermetabolic right axillary lymph nodes compatible with metastatic adenopathy. 3.  No evidence for distant metastatic disease.   Original Report Authenticated By: Signa Kell, M.D.   Nm Sentinel Node Inj-no Rpt (breast)  04/07/2013   CLINICAL DATA: abnormal nodules on left - to get left mastecotmy - to do  only left axillary sentinel lymph node   Sulfur colloid was injected intradermally by the nuclear medicine  technologist for breast cancer sentinel node localization.    Dg Chest Portable 1 View  04/16/2013   *RADIOLOGY REPORT*  Clinical Data: Shortness of breath.  PORTABLE CHEST - 1 VIEW  Comparison: 04/07/2013.  Findings: The cardiac silhouette, mediastinal and hilar contours are normal.  The left subclavian power port is stable.  The lungs are  clear.  No pleural effusion.  No pneumothorax.  IMPRESSION: No acute cardiopulmonary findings.   Original Report Authenticated By: Rudie Meyer, M.D.   Dg Chest Portable 1 View  04/07/2013   *RADIOLOGY REPORT*  Clinical Data: Port-A-Cath placement.  PORTABLE CHEST - 1 VIEW  Comparison: 04/03/2013  Findings: Left subclavian Port-A-Cath has been placed.  The tip is in the right atrium approximately 4 cm above the carina.  There are low lung volumes with mild cardiomegaly, vascular congestion and bibasilar atelectasis.  Skin staples noted across the lower chest with probable surgical drains in the anterior soft tissues.  No pneumothorax.  No acute bony abnormality.  IMPRESSION: Left subclavian Port-A-Cath tip in the mid-right atrium. Location of the tip in the right atrium may partially be related to the low lung volumes and semi recumbent nature of the study.  If further evaluation is felt warranted, upright two-view chest x-ray may demonstrate more optimal positioning of the catheter tip near the cavoatrial junction when the patient is recovered and able to take a deeper breath.  No pneumothorax.  Vascular congestion with bibasilar atelectasis and low lung volumes.   Original Report Authenticated By: Charlett Nose, M.D.   Dg Abd Portable 1v  04/19/2013   *RADIOLOGY REPORT*  Clinical Data: Rectal pain.  Constipation.  PORTABLE ABDOMEN - 1 VIEW  Comparison: None.  Findings: Normal bowel gas pattern.  Lumbar spine degenerative changes.  IMPRESSION: No acute abnormality.   Original Report Authenticated By: Beckie Salts, M.D.   Mm Digital Diagnostic Bilat  03/27/2013   **ADDENDUM** CREATED: 03/27/2013 13:33:25  The Impression should read 3.9 x 2 x 4.1 cm RIGHT BREAST mass.  Addended by:  Elpidio Eric. Si Gaul, M.D. on 03/27/2013 13:33:25.  **END ADDENDUM** SIGNED BY: Tinnie Gens T. Si Gaul, M.D.  03/27/2013   *RADIOLOGY REPORT*  Clinical Data:  52 year old female for annual bilateral mammograms and with palpable right breast mass  discovered on self examination.  DIGITAL DIAGNOSTIC BILATERAL MAMMOGRAM WITH CAD AND RIGHT BREAST ULTRASOUND:  Comparison:  12/11/2003  Findings:  ACR Breast Density Category 2: There is a scattered fibroglandular pattern.  A 3.5 x 4 cm irregular mass with distortion within the upper outer right breast is identified.  A few mildly enlarged lower level I right axillary lymph nodes are present. A 3 x 9 mm cluster of slightly heterogeneous calcifications within the posterior lower right breast are indeterminate. There is no evidence of suspicious mass, distortion or worrisome calcifications within the left breast.  Mammographic images were processed with CAD.  On physical exam, a firm palpable mass identified in the upper outer right breast.  Ultrasound is performed, showing a 3.9 x 2 x 4.1 cm irregular hypoechoic mass at the 10 o'clock position of the right breast 6 cm from the nipple. A few mildly enlarged lower level I right axillary/upper outer right breast lymph nodes are present, some which have lost their fatty hilum.  IMPRESSION: 3.9 x 2 x 4.1 cm irregular mass with mildly enlarged lower right axillary/upper outer right breast lymph nodes - highly suspicious for neoplasm with lymphatic spread.  3 x 9 mm cluster of indeterminate calcifications within the posterior lower right breast.  Tissue sampling of above areas is recommended.  BI-RADS CATEGORY 5:  Highly suggestive of malignancy - appropriate action should be taken.  RECOMMENDATION:  Ultrasound guided biopsy of the upper outer right breast mass and lymph node.  Stereotactic guided biopsy of right breast calcifications.  These biopsies will be performed today but dictated in a separate report.  I have  discussed the findings and recommendations with the patient. Results were also provided in writing at the conclusion of the visit.   Original Report Authenticated By: Harmon Pier, M.D.   Mm Digital Diagnostic Unilat R  03/27/2013   *RADIOLOGY REPORT*  Clinical  Data:  52 year old female - evaluate clip placement following ultrasound guided right breast biopsy.  DIGITAL DIAGNOSTIC RIGHT MAMMOGRAM  Comparison:  Previous exams.  Findings:  Films are performed following ultrasound guided biopsy of 4 cm irregular mass at the 10 o'clock position of the right breast 6 cm from the nipple.  The T shaped biopsy clip is in satisfactory position.  IMPRESSION: Satisfactory clip position following ultrasound guided right breast biopsy.   Original Report Authenticated By: Harmon Pier, M.D.   Mm Radiologist Eval And Mgmt  03/28/2013   *RADIOLOGY REPORT*  ESTABLISHED PATIENT OFFICE VISIT - LEVEL III [phone])  Chief Complaint: Post biopsy of three sites in the right breast.  History: Patient underwent ultrasound-guided core biopsy of a 4.1 cm mass over the 10 o'clock position of the right breast as well as ultrasound core biopsy of an abnormal lymph node at the 11 o'clock position of the right breast.  The patient also underwent stereotactic core needle biopsy of a group of microcalcifications over the approximate 6 o'clock position of the right breast.  Review of Systems:  Patient complains of minimal soreness at the biopsy sites.  Exam:  Both biopsy sites over the right breast are clean and dry without signs of hematoma or infection.  Assessment and Plan:  The patient was given the results of her biopsies.  Biopsy of the 4.1 cm mass at the 10 o'clock position is compatible with invasive mammary carcinoma and mammary carcinoma insitu.  Results of the lymph node biopsy at the 10 o'clock position is compatible with metastatic mammary carcinoma.  The results of the stereotactic biopsy of the microcalcifications at the 6 o'clock position is compatible with low grade DCIS.  These results are concordant with imaging findings.  Patient's immediate questions and concerns were addressed.  The patient was given an appointment for breast MRI 04/01/2013 at 09:30 a.m.  The patient was also given an  appointment to the [facility] 04/02/2013.  The patient will be contacted by a representative from the [facility] with more detailed information.   Original Report Authenticated By: Elberta Fortis, M.D.   Dg Fluoro Guide Cv Line-no Report  04/07/2013   CLINICAL DATA: port-a-cath insertion   FLOURO GUIDE CV LINE  Fluoroscopy was utilized by the requesting physician.  No radiographic  interpretation.    Mm Rt Breast Bx W Loc Dev 1st Lesion Image Bx Spec Stereo Guide  04/02/2013   *RADIOLOGY REPORT*  Clinical Data:  52 year old female with indeterminate calcifications in the lower right breast - for tissue sampling.  STEREOTACTIC-GUIDED VACUUM ASSISTED BIOPSY OF THE RIGHT BREAST AND SPECIMEN RADIOGRAPH  Comparison: Previous exams.  I met with the patient and we discussed the procedure of stereotactic-guided biopsy, including benefits and alternatives. We discussed the high likelihood of a successful procedure. We discussed the risks of the procedure, including infection, bleeding, tissue injury, clip migration, and inadequate sampling. Informed, written consent was given.  Using sterile technique, 2% lidocaine, stereotactic guidance, and a 9 gauge vacuum assisted device, biopsy was performed of the 6 mm cluster of indeterminate calcifications within the posterior lower right breast using a inferior approach.  Specimen radiograph was performed, showing calcification.  Specimens with calcifications are identified for pathology.  At the conclusion  of the procedure, a T shaped tissue marker clip was deployed into the biopsy cavity.  Follow-up 2-view mammogram confirmed clip placement to be satisfactory.  IMPRESSION: Stereotactic-guided biopsy of right lower breast calcifications. No apparent complications.   Original Report Authenticated By: Harmon Pier, M.D.   [place] Rt Breast Bx W Loc Dev 1st Lesion Img Bx Spec US Guide  03/27/2013   *RADIOLOGY REPORT*  Clinical Data:  52 year old female with irregular mass in the upper outer right  breast and mildly enlarged right lower axillary/upper outer right breast lymph nodes.  For tissue sampling of the irregular right breast mass.  ULTRASOUND GUIDED VACUUM ASSISTED CORE BIOPSY OF THE RIGHT BREAST  Comparison: Previous exams.  I met with the patient and we discussed the procedure of ultrasound- guided biopsy, including benefits and alternatives.  We discussed the high likelihood of a successful procedure. We discussed the risks of the procedure including infection, bleeding, tissue injury, clip migration, and inadequate sampling.  Informed written consent was given.  Using sterile technique, 2% lidocaine ultrasound guidance and a 12 gauge vacuum assisted needle biopsy was performed of the 3.9 x 2 x 4.1 cm irregular hypoechoic mass at the 10 o'clock position of the right breast 6 cm from the nipple using a lateral approach.  At the conclusion of the procedure, a T shaped tissue marker clip was deployed into the biopsy cavity.  Follow-up 2-view mammogram was performed and dictated separately.  IMPRESSION: Ultrasound-guided biopsy of right breast mass.  No apparent complications.  Pathology will be followed.   Original Report Authenticated By: Harmon Pier, M.D.   [place] Rt Breast Bx W Loc Dev Ea Add Lesion Img Bx Spec US Guide  03/27/2013   *RADIOLOGY REPORT*  Clinical Data:  52 year old female with irregular mass in the upper outer right breast and mildly enlarged right lower axillary/upper outer right breast lymph nodes.  For tissue sampling of one of the enlarged lymph nodes.  ULTRASOUND GUIDED CORE BIOPSY OF THE RIGHT BREAST  Comparison: Previous exams.  I met with the patient and we discussed the procedure of ultrasound- guided biopsy, including benefits and alternatives.  We discussed the high likelihood of a successful procedure. We discussed the risks of the procedure, including infection, bleeding, tissue injury, clip migration, and inadequate sampling.  Informed written consent was given.  Using  sterile technique 2% lidocaine, ultrasound guidance and a 14 gauge automated biopsy device, biopsy was performed of one of the enlarged lymph nodes in the lower right axilla/upper outer right breast using a lateral approach.  No immediate complication identified.  IMPRESSION: Ultrasound guided biopsy of enlarged lymph node in the lower right axilla/upper outer right breast.  No apparent complications.  Pathology will be followed.   Original Report Authenticated By: Harmon Pier, M.D.    Microbiology: Recent Results (from the past 240 hour(s))  CULTURE, BLOOD (ROUTINE X 2)     Status: None   Collection Time    04/17/13  2:03 AM      Result Value Range Status   Specimen Description BLOOD RIGHT HAND   Final   Special Requests BOTTLES DRAWN AEROBIC ONLY 10CC   Final   Culture  Setup Time 04/17/2013 08:54   Final   Culture     Final   Value:        BLOOD CULTURE RECEIVED NO GROWTH TO DATE CULTURE WILL BE HELD FOR 5 DAYS BEFORE ISSUING A FINAL NEGATIVE REPORT   Report Status PENDING   Incomplete  CULTURE,  BLOOD (ROUTINE X 2)     Status: None   Collection Time    04/17/13  2:10 AM      Result Value Range Status   Specimen Description BLOOD RIGHT ARM   Final   Special Requests BOTTLES DRAWN AEROBIC ONLY 5CC   Final   Culture  Setup Time 04/17/2013 08:53   Final   Culture     Final   Value:        BLOOD CULTURE RECEIVED NO GROWTH TO DATE CULTURE WILL BE HELD FOR 5 DAYS BEFORE ISSUING A FINAL NEGATIVE REPORT   Report Status PENDING   Incomplete     Labs: Basic Metabolic Panel:  Recent Labs Lab 04/16/13 2207 04/17/13 0741 04/18/13 0522 04/19/13 0815  NA 137 135 139 141  K 4.2 4.0 3.3* 3.8  CL 95* 98 105 106  CO2 24 21 25 24   GLUCOSE 191* 177* 118* 110*  BUN 14 14 13 14   CREATININE 1.43* 1.11* 0.86 0.79  CALCIUM 9.8 8.8 8.5 8.8   Liver Function Tests:  Recent Labs Lab 04/17/13 0741  AST 23  ALT 26  ALKPHOS 63  BILITOT 0.2*  PROT 6.7  ALBUMIN 3.0*   No results found for this  basename: LIPASE, AMYLASE,  in the last 168 hours No results found for this basename: AMMONIA,  in the last 168 hours CBC:  Recent Labs Lab 04/16/13 2207 04/17/13 0741 04/18/13 0522 04/19/13 0815  WBC 32.0* 24.1* 23.0* 22.7*  NEUTROABS 28.5* 22.3*  --   --   HGB 15.8* 12.4 9.7* 10.1*  HCT 45.2 36.8 29.3* 30.6*  MCV 89.2 88.7 90.4 91.1  PLT 462* 316 239 244   Cardiac Enzymes:  Recent Labs Lab 04/16/13 2207  TROPONINI <0.30   BNP: BNP (last 3 results) No results found for this basename: PROBNP,  in the last 8760 hours CBG: No results found for this basename: GLUCAP,  in the last 168 hours     Signed:  Inas Avena  [facility] 04/19/2013, 5:27 PM

## 2013-04-19 NOTE — Progress Notes (Signed)
Patient upset about possible discharge,  Stating" i am not going home until I have a bowel movement", bowel movement documented on 04/18/13, patient also upset about rectal pain from hemorrhoids, new order received. Charge into see patient.

## 2013-04-19 NOTE — Progress Notes (Signed)
[facility] Progress Note    [name] [name] ZOX:[id number] DOB: 05-19-1961 DOA: 04/16/2013 PCP: Raliegh Ip, MD  Brief narrative: [name] [name] is a 52 y.o. female who just had recent bilateral mastectomy a week and half ago started noticing urticarial rash initially known in the right upper extremity which gradually spread all over both upper extremities and then involved the lower extremities and it was associated with itching. Patient also was having wheezing since morning with cough which also worsened. Patient was brought to the ER was found to be tachycardic and initially Solu-Medrol Pepcid and Benadryl was given which did not improve the rash and has epinephrine given at that time it improved. Patient was found to be having significant leukocytosis with lactic acidosis. Patient is not hypotensive and presently on my exam patient is rash has essentially resolved. Patient states that on the new medications patient has been on are hydrocodone and Zofran. Patient denies any nausea vomiting abdominal pain chest pain fever chills diarrhea. Patient's tachycardia has significantly improved. Patient will be admitted for further observation and management. Patient still feels fatigued. Due to the shortness of breath ER physician did order a CT angiogram of the chest which does not show anything acute.   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 Azucena Freed, charge nurse in unit [id number], did not reveal any external hemorrhoids. The digital rectal exam revealed extreme tenderness about 1 cm inside the rectum with resultant rectal spasms and inability to further advance the finger to the rectal vault. Plan for a abdominal x-ray, Anusol HC suppositories and will monitor status. Patient improved during the day and was stable for discharge on May 24.    Hypokalemia  Repleted  Anemia - significant Hb drop - suspect pure blood loss from surgery - suspect initial Hb was high due to hemoconcentration -  Hemoglobin trending up from May 23 until 5/24 suggesting improvement   Code Status: full code Family Communication: daughter Disposition Plan: home   Consultants: none  Procedures: none  Antibiotics: none  DVT prophylaxis: Lovenox  HPI/Subjective: Complains of 10 out of 10 rectal pain  Objective: Blood pressure 142/81, pulse 81, temperature 98.4 F (36.9 C), temperature source Oral, resp. rate 18, height 5\' 2"  (1.575 m), weight 101.424 kg (223 lb 9.6 oz), last menstrual period 03/04/2013, SpO2 100.00%.  Intake/Output Summary (Last 24 hours) at 04/19/13 1127 Last data filed at 04/19/13 0900  Gross per 24 hour  Intake 1229.5 ml  Output     78 ml  Net 1151.5 ml   Patient Vitals  for the past 24 hrs:  BP Temp Temp src Pulse Resp SpO2 Height Weight  04/19/13 0933 142/81 mmHg 98.4 F (36.9 C) Oral 81 18 100 % - -  04/19/13 0440 140/63 mmHg 98 F (36.7 C) Oral 73 18 100 % - -  04/18/13 2028 158/94 mmHg 97.4 F (36.3 C) Oral 79 16 100 % 5\' 2"  (1.575 m) 101.424 kg (223 lb 9.6 oz)  04/18/13 1845 160/102 mmHg 97.7 F (36.5 C) Oral 90 16 100 % - -       Exam: General: No acute respiratory distress Lungs: Clear to auscultation bilaterally without wheezes or crackles Cardiovascular: Regular rate and rhythm without murmur gallop or rub normal S1 and S2 Abdomen: Nontender, nondistended, soft, bowel sounds positive, no rebound, no ascites, no appreciable mass Extremities: No significant cyanosis, clubbing, or edema bilateral lower extremities Rectal exam see above  Data Reviewed: Basic Metabolic Panel:  Recent Labs Lab 04/16/13 2207 04/17/13 0741 04/18/13 0522 04/19/13 0815  NA 137 135 139 141  K 4.2 4.0 3.3* 3.8  CL 95* 98 105 106  CO2 24 21 25 24   GLUCOSE 191* 177* 118* 110*  BUN 14 14 13 14   CREATININE 1.43* 1.11* 0.86 0.79  CALCIUM 9.8 8.8 8.5 8.8   Liver Function Tests:  Recent Labs Lab 04/17/13 0741  AST 23  ALT 26  ALKPHOS 63  BILITOT 0.2*  PROT 6.7  ALBUMIN 3.0*   No results found for this basename: LIPASE, AMYLASE,  in the last 168 hours No results found for this basename: AMMONIA,  in the last 168 hours CBC:  Recent Labs Lab 04/16/13 2207 04/17/13 0741 04/18/13 0522 04/19/13 0815  WBC 32.0* 24.1* 23.0* 22.7*  NEUTROABS 28.5* 22.3*  --   --   HGB 15.8* 12.4 9.7* 10.1*  HCT 45.2 36.8 29.3* 30.6*  MCV 89.2 88.7 90.4 91.1  PLT 462* 316 239 244   Cardiac Enzymes:  Recent Labs Lab 04/16/13 2207  TROPONINI <0.30   BNP (last 3 results) No results found for this basename: PROBNP,  in the last 8760 hours CBG: No results found for this basename: GLUCAP,  in the last 168 hours  Recent Results (from the past 240 hour(s))  CULTURE, BLOOD (ROUTINE X 2)     Status: None   Collection Time    04/17/13  2:03 AM      Result Value Range Status   Specimen Description BLOOD RIGHT HAND   Final   Special Requests BOTTLES DRAWN AEROBIC ONLY 10CC   Final   Culture  Setup Time 04/17/2013 08:54   Final   Culture     Final   Value:        BLOOD CULTURE RECEIVED NO GROWTH TO DATE CULTURE WILL BE HELD FOR 5  DAYS BEFORE ISSUING A FINAL NEGATIVE REPORT   Report Status PENDING   Incomplete  CULTURE, BLOOD (ROUTINE X 2)     Status: None   Collection Time    04/17/13  2:10 AM      Result Value Range Status   Specimen Description BLOOD RIGHT ARM   Final   Special Requests BOTTLES DRAWN AEROBIC ONLY 5CC   Final   Culture  Setup Time 04/17/2013 08:53   Final   Culture     Final   Value:        BLOOD CULTURE RECEIVED NO GROWTH TO DATE CULTURE WILL BE HELD FOR 5 DAYS BEFORE ISSUING A FINAL NEGATIVE REPORT   Report Status  PENDING   Incomplete     Studies:  Recent x-ray studies have been reviewed in detail by the Attending Physician  Scheduled Meds:  Scheduled Meds: . acetaminophen  650 mg Oral QID  . ALPRAZolam  0.25 mg Oral TID  . diphenhydrAMINE  25 mg Oral TID  . escitalopram  10 mg Oral Daily  . famotidine (PEPCID) IV  20 mg Intravenous Q12H  . hydrocortisone  25 mg Rectal BID  . sodium chloride  3 mL Intravenous Q12H  . traMADol  100 mg Oral QID   Continuous Infusions:     [name] Carpenter, MD [phone]  If 7PM-7AM, please contact night-coverage [email or web address] Password [facility] 04/19/2013, 11:27 AM   LOS: 3 days

## 2013-04-23 ENCOUNTER — Encounter (INDEPENDENT_AMBULATORY_CARE_PROVIDER_SITE_OTHER): Payer: Self-pay | Admitting: Surgery

## 2013-04-23 ENCOUNTER — Ambulatory Visit (INDEPENDENT_AMBULATORY_CARE_PROVIDER_SITE_OTHER): Payer: Medicaid Other | Admitting: Surgery

## 2013-04-23 VITALS — BP 150/90 | HR 58 | Temp 97.7°F | Resp 14 | Ht 62.0 in | Wt 211.8 lb

## 2013-04-23 DIAGNOSIS — Z09 Encounter for follow-up examination after completed treatment for conditions other than malignant neoplasm: Secondary | ICD-10-CM

## 2013-04-23 LAB — CULTURE, BLOOD (ROUTINE X 2)

## 2013-04-23 MED ORDER — DOXYCYCLINE HYCLATE 100 MG PO TABS: 100.0000 mg | ORAL_TABLET | Freq: Two times a day (BID) | ORAL | Status: DC

## 2013-04-23 NOTE — Progress Notes (Signed)
Re:   [name] [name] DOB:   [birth year] MRN:   [id number]  [facility]  ASSESSMENT AND PLAN: 1.  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  I gave her #30 of Vicodin (no refills), but she knows that this worsens her constipation without activity.  I gave her #15 Zofran (4 mg) for nausea.  She will return for wound check (probably remove the remainder of her staples) and drain recheck in one week.  She knows that I am not in the office next week and she will see a partner.   1a.  Will need genetics  2.  Left mastectomy  Final path report - Radial scar, 0/1 nodes.  3.  Sleep apnea  On CPAP x 8 years 4.  Hypertension 5.  Morbid obesity 6.  Questionable recent history of MRSA 7.  History of urinary tract infections. 8.  History of anxiety 9.  Anxiety today with wheezing  This had pretty much resolved by the time she left the office, but she knew to go the to ER if it returns or she has other problems. 10.  Inactive since surgery - she needs to be more active.  Chief Complaint  Patient presents with  . Routine Post Op    staple removal   REFERRING PHYSICIAN: Raliegh Ip, MD  HISTORY OF PRESENT ILLNESS: [name] [name] is a 52 y.o. (DOB: [birth year])  white  female whose primary care physician is Raliegh Ip, MD [name]) and comes to me today for follow up of right breast cancer and bilateral mastectomies.  Her daughter, Cyra Spader with her.   She apparently had an admission after Dr Ezzard Standing so her for  constipation, still not getting around much, not eating, no fever. Notes bad odo on dressings and increased discomfort, mainlyu on the right  I reviewed the path report and gave her a copy.  Breast History: The patient has had no prior breast biopsy or breast disease. Her last mammograms were around 2009. She says she cannot for mammograms in the intervening years. She recently had a urinary tract infection and was seen at the [facility] emergency room at the end of April.  They recognized that she had an abnormal right breast suggested that she have mammograms. She said she felt she had a right axillary infection that was being treated for MRSA.  On 03/27/2013 she underwent a mammogram that showed a 4.1 cm irregular lesion at the 10:00 position of the right breast. She also had an abnormality at the 5:00 position of the right breast. 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  She subsequently underwent an MRI of both breast on 04/01/2013 showed the lesions on the right,  It also showed 2 foci of masslike enhancement at the 7:00 position and the 5:00 position in the left breast. Biopsies were  suggested for these lesions.  The patient had her last menstrual period September 2013. She stopped birth-control pills around the same time. Recently she had another period.  Her grandmother had breast cancer. Her mother had breast cancer over 30 years ago. Her mother had bilateral mastectomies with recostruction. I think this has helped the patient in her decisions about treatment.  She has one daughter who is with her today.    Past Medical History  Diagnosis Date  . Hypertension   . Anxiety   . Depression   . Headache(784.0)   . Sleep apnea     cpcp  . Shortness of breath   . Breast cancer       Past Surgical History  Procedure  Laterality Date  . Mastectomy, radical Right 04/08/2013  . Simple mastectomy with axillary sentinel node biopsy Left 04/08/2013  . Mastectomy modified radical Right 04/07/2013    Procedure: RIGHT MASTECTOMY MODIFIED RADICAL;  Surgeon: Kandis Cocking, MD;  Location: [facility] OR;  Service: General;  Laterality: Right;  . Simple mastectomy with axillary sentinel node biopsy Left 04/07/2013    Procedure:  LEFT SMPLE MASTECTOMY WITH AXILLARY SENTINEL NODE BIOPSY;  Surgeon: Kandis Cocking, MD;  Location: [facility] OR;  Service: General;  Laterality: Left;  . Portacath placement Left 04/07/2013    Procedure: INSERTION PORT-A-CATH;  Surgeon: Kandis Cocking, MD;  Location[facility] OR;  Service: General;  Laterality: Left;      Current Outpatient Prescriptions  Medication Sig Dispense Refill  . acetaminophen (TYLENOL) 500 MG tablet Take 1,000 mg by mouth 2 (two) times daily as needed. For pain      . ALPRAZolam (XANAX) 0.25 MG tablet Take 1 tablet by mouth Three times a day.      . docusate sodium 100 MG CAPS Take 200 mg by mouth 2 (two) times daily.  10 capsule  0  . escitalopram (LEXAPRO) 10 MG tablet Take 1 tablet by mouth daily.      . hydrocortisone (ANUSOL-HC) 25 MG suppository Place 1 suppository (25 mg total) rectally 2 (two) times daily.  100 suppository  0  . ondansetron (ZOFRAN) 4 MG tablet Take 4 mg by mouth every 8 (eight) hours as needed for nausea.      . diphenhydrAMINE (BENADRYL) 25 mg capsule Take 1 capsule (25 mg total) by mouth 3 (three) times daily.  30 capsule  0  . doxycycline (VIBRA-TABS) 100 MG tablet Take 1 tablet (100 mg total) by mouth 2 (two) times daily.  14 tablet  2  . traMADol (ULTRAM) 50 MG tablet Take 1 tablet (50 mg total) by mouth every 8 (eight) hours as needed for pain.  30 tablet  0   No current facility-administered medications for this visit.      Allergies  Allergen Reactions  . Codeine Nausea And Vomiting  . Penicillins Rash  . Percocet (Oxycodone-Acetaminophen) Itching  .  Septra (Bactrim) Other (See Comments)    Heart races    REVIEW OF SYSTEMS: Skin:  No history of rash.  No history of abnormal moles. Infection:  She said that she has been treated for MRSA since Sept of last year, but in part this seems to be self treatment with out bacteriology. Neurologic:  No history of stroke.  No history of seizure.  No history of headaches. Cardiac:  No history of hypertension. No history of heart disease.  No history of prior cardiac catheterization.  No history of seeing a cardiologist. Pulmonary:  Has OSA/CPAP  x 8 years.  Sees Dr. Melba Coon, whom she last saw about 1 and 1/2 years ago.  Endocrine:  No diabetes. No thyroid disease. Gastrointestinal:  No history of stomach disease.  No history of liver disease.  No history of gall bladder disease.  No history of pancreas disease.  No history of colon disease. Urologic:  Has been to ER twice in last 6 months for "bad" kidney infections (but not hospitalized). GYN:  One daughter. 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  Has had cortisone shots.  Right knee worse.  Dr. August Saucer is ortho. Hematologic:  No bleeding disorder.  No history of anemia.  Not anticoagulated. Psycho-social:  The patient has an anxious personality.  SOCIAL and FAMILY HISTORY: Divorced.  Lives with mother. At initial visit, she had her mother, [name], and her daughter, Morayo Leven with her. Unemployed as of Sept 2013.  Did work as a Sales executive.    PHYSICAL EXAM: BP 150/90  Pulse 58  Temp(Src) 97.7 F (36.5 C) (Temporal)  Resp 14  Ht 5\' 2"  (1.575 m)  Wt 211 lb 12.8 oz (96.072 kg)  BMI 38.73 kg/m2  LMP 03/04/2013  General: Obese WF who is alert but worried. HEENT: Normal. Pupils equal. Lymph Nodes:  No supraclavicular or cervical nodes.  Lungs: Some wheezing, which got better as she was here.  Sats 92-94% Heart:  Tachycardic at first (110-120), but pulse down to 90 when she left. No murmur or rub. Breast:   Right:mastectomy wound is red with fluid under it exquisitely sensitive. The remaining lateral drain is occluded and not draining so I removed it. Upon doing so copious fluid drained out and there was no residual under the inferior flap.the left mastectomy shows a little erythema but there is no fluid. I removed some lateral staples from both and dressed it. Chest:  Port left subclavian is okay.

## 2013-04-25 ENCOUNTER — Telehealth: Payer: Self-pay | Admitting: Oncology

## 2013-04-25 ENCOUNTER — Other Ambulatory Visit: Payer: Medicaid Other | Admitting: Lab

## 2013-04-25 ENCOUNTER — Ambulatory Visit: Payer: Medicaid Other | Admitting: Oncology

## 2013-04-25 NOTE — Progress Notes (Signed)
ID: [name] OB: [birth year]  MR#: [id number]  JYN#:[id number]  PCP: [name] Ip, MD GYN:   SU: [name] [name] OTHER MD: [name] [name]   HISTORY OF PRESENT ILLNESS: [name] [name] tells me she has not been able to have mammography for several years because of cost issues. More recently, she had noted a mass in her right breast but thought this might be MRSA. The urinary tract infection took her to her physician's office, and she brought the breast mass to the attention of [name] Pisciotta NP. She set up the patient for bilateral diagnostic mammography and right breast ultrasound at the [facility] 03/27/2013, which showed a 4 cm irregular mass in the upper outer right breast, with a 9 mm cluster of calcifications in the posterior lower right breast and a few mildly enlarged level I right axillary lymph nodes. The breast mass was palpable and ultrasound showed it to measure 4.1 cm, with some of the level I right axillary lymph nodes noted to have lost their fatty hilum.  Biopsy of both the suspicious areas in the right breast as well as one of the suspicious lymph nodes was performed 03/27/2013 and showed ([id number]) the larger breast mass to consist of an invasive ductal carcinoma with lobular features, grade 2, estrogen and progesterone receptor both 100% positive, with an MIB-1 of 15%, and no HER-2 amplification. 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 The sampled right axillary lymph node was also positive.  Bilateral breast MRI 04/01/2013 showed the larger right breast mass to measure 5.4 cm, with a satellite nodule immediately inferior to it measuring 8 mm. 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 There were multiple enlarged right axillary lymph nodes, the largest measuring 2.8 cm. In addition, in the left breast there were 2 foci on non-masslike enhancement consistent with DCIS  measuring 1.3 cm and 2.2 cm. These weren't different quadrants.  The patient's subsequent history is as noted below  INTERVAL HISTORY: [name] [name] was seen at the multidisciplinary breast cancer clinic may 06/15/2013 accompanied by her mother [name] [name] and her daughter [name] [name].  REVIEW OF SYSTEMS: [name] [name] tolerated the right breast biopsies well. She describes multiple symptoms which are mostly long-standing and include chills, fatigue, hearing loss, mouth and lip sores, chest pain and poor circulation in the right arm, sleeping on more than one pillow, nausea, urinary tract infections, back and joint pain, difficulty walking, headaches, anxiety, depression, hot flashes, and swollen lymph glands.  PAST MEDICAL HISTORY: Past Medical History  Diagnosis Date  . Hypertension   . Anxiety   . Depression   . Headache(784.0)   . Sleep apnea     cpcp  . Shortness of breath   . Breast cancer     PAST SURGICAL HISTORY: Past Surgical History  Procedure Laterality Date  . Mastectomy, radical Right 04/08/2013  . Simple mastectomy with axillary sentinel node biopsy Left 04/08/2013  . Mastectomy modified radical Right 04/07/2013    Procedure: RIGHT MASTECTOMY MODIFIED RADICAL;  Surgeon: [name] Cocking, MD;  Location: [facility] OR;  Service: General;  Laterality: Right;  . Simple mastectomy with axillary sentinel node biopsy Left 04/07/2013    Procedure:  LEFT SMPLE MASTECTOMY WITH AXILLARY SENTINEL NODE BIOPSY;  Surgeon: [name] Cocking, MD;  Location: [facility] OR;  Service: General;  Laterality: Left;  . Portacath placement Left 04/07/2013    Procedure: INSERTION PORT-A-CATH;  Surgeon:  [name] Cocking, MD;  Location: [facility] OR;  Service: General;  Laterality: Left;    FAMILY HISTORY Family History  Problem Relation Age of Onset  . Breast cancer Mother 16  . Breast cancer Maternal Grandmother 6  . Prostate cancer Paternal Grandfather 47   the patient's father died at age 38 in an airplane crash (he was a Naval architect unsupervised). The patient's mother is alive at age 37. Her name is [name] [name] and she works for [employer]. [name] [name] has a history of breast cancer diagnosed at age 4. She had one sister who is alive and well. [name] [name]'s mother, the patient's grandmother, was diagnosed with breast cancer at age 85. She had 2 sisters, neither with breast cancer. [name] [name] herself had one brother, no sisters. There is no other history of breast or ovary cancer in the family.  GYNECOLOGIC HISTORY:  Menarche age 87, first live birth age 68. The patient was taking birth control pills until September. She stopped having periods at that time, but had one in late March.  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 Her husband [name] [name] (goes by "[name] [name]") is disabled secondary to diabetes. They are separated, but not divorced, and remain on good terms. [name] [name] lives with her mother [name] [name] and 8 pools. The patient's daughter [name] [name] also lives with the patient.    ADVANCED DIRECTIVES: Not in place. The patient was clear today that she intends to name her daughter [name] as her healthcare power of attorney, bypassing her husband.  HEALTH MAINTENANCE: History  Substance Use Topics  . Smoking status: Never Smoker   . Smokeless tobacco: Never Used  . Alcohol Use: No     Colonoscopy: Never  PAP: Possibly 2006  Bone density: Possibly 2004  Lipid panel:  Allergies  Allergen Reactions  . Codeine Nausea And Vomiting  . Penicillins Rash  . Percocet (Oxycodone-Acetaminophen) Itching  . Septra (Bactrim) Other (See Comments)    Heart races    Current Outpatient Prescriptions  Medication Sig Dispense Refill  . acetaminophen (TYLENOL) 500 MG tablet Take 1,000 mg by mouth 2 (two) times daily as needed. For pain      . ALPRAZolam (XANAX) 0.25 MG tablet Take 1 tablet by mouth Three times a day.      . diphenhydrAMINE (BENADRYL) 25 mg capsule Take 1 capsule (25 mg total) by  mouth 3 (three) times daily.  30 capsule  0  . docusate sodium 100 MG CAPS Take 200 mg by mouth 2 (two) times daily.  10 capsule  0  . doxycycline (VIBRA-TABS) 100 MG tablet Take 1 tablet (100 mg total) by mouth 2 (two) times daily.  14 tablet  2  . escitalopram (LEXAPRO) 10 MG tablet Take 1 tablet by mouth daily.      . hydrocortisone (ANUSOL-HC) 25 MG suppository Place 1 suppository (25 mg total) rectally 2 (two) times daily.  100 suppository  0  . ondansetron (ZOFRAN) 4 MG tablet Take 4 mg by mouth every 8 (eight) hours as needed for nausea.      . traMADol (ULTRAM) 50 MG tablet Take 1 tablet (50 mg total) by mouth every 8 (eight) hours as needed for pain.  30 tablet  0   No current facility-administered medications for this visit.    OBJECTIVE: Middle-aged white woman in no acute distress There were no vitals filed for this visit.   There is no weight on file to calculate BMI.  ECOG FS: 1  Sclerae unicteric Oropharynx clear No cervical or supraclavicular adenopathy Lungs no rales or rhonchi Heart regular rate and rhythm Abd obese, benign MSK no focal spinal tenderness, no peripheral edema Neuro: nonfocal, well oriented, appropriate affect Breasts: The right breast is status post recent biopsy. There is a mild ecchymosis. This partially obscures the palpable mass in the upper outer quadrant of the right breast. I do not palpate well-defined right axillary adenopathy. There is a right axillary rash, without frank hidradenitis. I do not palpate a mass in the left breast. The left axilla is benign.   LAB RESULTS: No results found for this basename: SPEP,  UPEP    Lab Results  Component Value Date   WBC 22.7* 04/19/2013   NEUTROABS 22.3* 04/17/2013   HGB 10.1* 04/19/2013   HCT 30.6* 04/19/2013   MCV 91.1 04/19/2013   PLT 244 04/19/2013      Chemistry      Component Value Date/Time   NA 141 04/19/2013 0815   NA 139 04/02/2013 0844   K 3.8 04/19/2013 0815   K 3.6 04/02/2013 0844   CL  106 04/19/2013 0815   CL 100 04/02/2013 0844   CO2 24 04/19/2013 0815   CO2 29 04/02/2013 0844   BUN 14 04/19/2013 0815   BUN 13.9 04/02/2013 0844   CREATININE 0.79 04/19/2013 0815   CREATININE 0.9 04/02/2013 0844      Component Value Date/Time   CALCIUM 8.8 04/19/2013 0815   CALCIUM 9.3 04/02/2013 0844   ALKPHOS 63 04/17/2013 0741   ALKPHOS 61 04/02/2013 0844   AST 23 04/17/2013 0741   AST 11 04/02/2013 0844   ALT 26 04/17/2013 0741   ALT 14 04/02/2013 0844   BILITOT 0.2* 04/17/2013 0741   BILITOT <0.20 Repeated and Verified 04/02/2013 0844       No results found for this basename: LABCA2    No components found with this basename: [id number]    No results found for this basename: INR,  in the last 168 hours  Urinalysis    Component Value Date/Time   COLORURINE YELLOW 04/18/2013 0019   APPEARANCEUR TURBID* 04/18/2013 0019   LABSPEC 1.021 04/18/2013 0019   PHURINE 6.5 04/18/2013 0019   GLUCOSEU NEGATIVE 04/18/2013 0019   HGBUR SMALL* 04/18/2013 0019   BILIRUBINUR NEGATIVE 04/18/2013 0019   KETONESUR NEGATIVE 04/18/2013 0019   PROTEINUR NEGATIVE 04/18/2013 0019   UROBILINOGEN 1.0 04/18/2013 0019   NITRITE NEGATIVE 04/18/2013 0019   LEUKOCYTESUR NEGATIVE 04/18/2013 0019    STUDIES: US Breast Right  03/27/2013  **ADDENDUM** CREATED: 03/27/2013 13:33:25  The Impression should read 3.9 x 2 x 4.1 cm RIGHT BREAST mass.  Addended by:  Elpidio Eric. Si Gaul, M.D. on 03/27/2013 13:33:25.  **END ADDENDUM** SIGNED BY: Tinnie Gens T. Si Gaul, M.D.   03/27/2013  *RADIOLOGY REPORT*  Clinical Data:  52 year old female for annual bilateral mammograms and with palpable right breast mass discovered on self examination.  DIGITAL DIAGNOSTIC BILATERAL MAMMOGRAM WITH CAD AND RIGHT BREAST ULTRASOUND:  Comparison:  12/11/2003  Findings:  ACR Breast Density Category 2: There is a scattered fibroglandular pattern.  A 3.5 x 4 cm irregular mass with distortion within the upper outer right breast is identified.  A few mildly enlarged lower level I right  axillary lymph nodes are present. A 3 x 9 mm cluster of slightly heterogeneous calcifications within the posterior lower right breast are indeterminate. There is no evidence of suspicious mass, distortion or worrisome calcifications within the left breast.  Mammographic images were processed with CAD.  On physical exam, a firm palpable mass identified in the upper outer right breast.  Ultrasound is performed, showing a 3.9 x 2 x 4.1 cm irregular hypoechoic mass at the 10 o'clock position of the right breast 6 cm from the nipple. A few mildly enlarged lower level I right axillary/upper outer right breast lymph nodes are present, some which have lost their fatty hilum.  IMPRESSION: 3.9 x 2 x 4.1 cm irregular mass with mildly enlarged lower right axillary/upper outer right breast lymph nodes - highly suspicious for neoplasm with lymphatic spread.  3 x 9 mm cluster of indeterminate calcifications within the posterior lower right breast.  Tissue sampling of above areas is recommended.  BI-RADS CATEGORY 5:  Highly suggestive of malignancy - appropriate action should be taken.  RECOMMENDATION:  Ultrasound guided biopsy of the upper outer right breast mass and lymph node.  Stereotactic guided biopsy of right breast calcifications.  These biopsies will be performed today but dictated in a separate report.  I have discussed the findings and recommendations with the patient. Results were also provided in writing at the conclusion of the visit.   Original Report Authenticated By: Harmon Pier, M.D.    Mr Breast Bilateral W Wo Contrast  04/01/2013  *RADIOLOGY REPORT*  Clinical Data: Recent diagnosis of invasive mammary cancer and DCIS in the right breast with metastatic right axillary lymphadenopathy. Family history of breast cancer in her mother at age 107 and maternal grandmother at age 28.  BILATERAL BREAST MRI WITH AND WITHOUT CONTRAST  Technique: Multiplanar, multisequence MR images of both breasts were obtained prior to and  following the intravenous administration of 20 ml of Multihance.  Three dimensional images were evaluated at the independent DynaCad workstation.  Comparison:  No prior MRI.  Mammography 03/27/2013, 12/01/2003.  Findings: Large irregular enhancing mass in the upper outer quadrant of the right breast spanning 10-11 o'clock measuring approximately 5.4 x 3.6 x 3.3 cm, corresponding to the recently biopsied mass.  This mass demonstrates both plateau and washout kinetics.  Oval shaped enhancing satellite nodule immediately inferior and lateral to the dominant mass measuring 0.8 cm maximally, also with plateau and washout kinetics.  Approximate 2.8 cm focus of non mass-like enhancement in the posterior third of the breast at the level of the nipple, corresponding to the biopsy- proven DCIS.  No abnormal enhancement elsewhere in the right breast to suggest other foci of DCIS.  Pathologically enlarged right axillary lymph nodes and right intramammary lymph nodes, including the previously biopsied node. The largest right axillary node measures approximately 2.8 cm in length.  No pathologic right internal mammary, left internal mammary or left axillary lymphadenopathy.  2 foci of non mass-like enhancement in the left breast, similar to the enhancement pattern of the DCIS in the right breast.  An approximate 1.3 cm focus is present in the lower inner quadrant at 7 o'clock approximately 8 cm from the nipple.  An approximate 2.2 cm focus is present in the lower outer quadrant at 5 o'clock approximately 12 cm from the nipple.  There is no correlate on the recent mammogram.  IMPRESSION:  1.  Biopsy-proven invasive mammary cancer in the upper outer quadrant of the right breast with maximum measurement approximating 5.4 cm. 2.  0.8 cm satellite nodule immediately inferior and lateral to the dominant mass in the right breast. 3.  Approximate 2.8 cm focus of non mass-like enhancement in the posterior third of the right breast  corresponding  to the biopsy- proven DCIS. 4.  Pathologic right axillary lymphadenopathy.  No pathologic lymphadenopathy elsewhere. 5.  2 foci of non mass-like enhancement in the left breast, at the 7 o'clock position approximately 8 cm from the nipple and at the 5 o'clock position approximately 12 cm from the nipple, without mammographic correlate.  RECOMMENDATION: MR biopsy of the 2 foci of non mass-like enhancement in the left breast.  THREE-DIMENSIONAL MR IMAGE RENDERING ON INDEPENDENT WORKSTATION:  Three-dimensional MR images were rendered by post-processing of the original MR data on an independent workstation.  The three- dimensional MR images were interpreted, and findings were reported in the accompanying complete MRI report for this study.  BI-RADS CATEGORY 4:  Suspicious abnormality - biopsy should be considered.   Original Report Authenticated By: Hulan Saas, M.D.    Mm Digital Diagnostic Bilat  03/27/2013  **ADDENDUM** CREATED: 03/27/2013 13:33:25  The Impression should read 3.9 x 2 x 4.1 cm RIGHT BREAST mass.  Addended by:  Elpidio Eric. Si Gaul, M.D. on 03/27/2013 13:33:25.  **END ADDENDUM** SIGNED BY: Tinnie Gens T. Si Gaul, M.D.   03/27/2013  *RADIOLOGY REPORT*  Clinical Data:  52 year old female for annual bilateral mammograms and with palpable right breast mass discovered on self examination.  DIGITAL DIAGNOSTIC BILATERAL MAMMOGRAM WITH CAD AND RIGHT BREAST ULTRASOUND:  Comparison:  12/11/2003  Findings:  ACR Breast Density Category 2: There is a scattered fibroglandular pattern.  A 3.5 x 4 cm irregular mass with distortion within the upper outer right breast is identified.  A few mildly enlarged lower level I right axillary lymph nodes are present. A 3 x 9 mm cluster of slightly heterogeneous calcifications within the posterior lower right breast are indeterminate. There is no evidence of suspicious mass, distortion or worrisome calcifications within the left breast.  Mammographic images were processed with  CAD.  On physical exam, a firm palpable mass identified in the upper outer right breast.  Ultrasound is performed, showing a 3.9 x 2 x 4.1 cm irregular hypoechoic mass at the 10 o'clock position of the right breast 6 cm from the nipple. A few mildly enlarged lower level I right axillary/upper outer right breast lymph nodes are present, some which have lost their fatty hilum.  IMPRESSION: 3.9 x 2 x 4.1 cm irregular mass with mildly enlarged lower right axillary/upper outer right breast lymph nodes - highly suspicious for neoplasm with lymphatic spread.  3 x 9 mm cluster of indeterminate calcifications within the posterior lower right breast.  Tissue sampling of above areas is recommended.  BI-RADS CATEGORY 5:  Highly suggestive of malignancy - appropriate action should be taken.  RECOMMENDATION:  Ultrasound guided biopsy of the upper outer right breast mass and lymph node.  Stereotactic guided biopsy of right breast calcifications.  These biopsies will be performed today but dictated in a separate report.  I have discussed the findings and recommendations with the patient. Results were also provided in writing at the conclusion of the visit.   Original Report Authenticated By: Harmon Pier, M.D.    Mm Digital Diagnostic Unilat R  03/27/2013  *RADIOLOGY REPORT*  Clinical Data:  52 year old female - evaluate clip placement following ultrasound guided right breast biopsy.  DIGITAL DIAGNOSTIC RIGHT MAMMOGRAM  Comparison:  Previous exams.  Findings:  Films are performed following ultrasound guided biopsy of 4 cm irregular mass at the 10 o'clock position of the right breast 6 cm from the nipple.  The T shaped biopsy clip is in satisfactory position.  IMPRESSION: Satisfactory clip position following  ultrasound guided right breast biopsy.   Original Report Authenticated By: Harmon Pier, M.D.    Mm Radiologist Eval And Mgmt  03/28/2013  *RADIOLOGY REPORT*  ESTABLISHED PATIENT OFFICE VISIT - LEVEL III [phone])  Chief  Complaint: Post biopsy of three sites in the right breast.  History: Patient underwent ultrasound-guided core biopsy of a 4.1 cm mass over the 10 o'clock position of the right breast as well as ultrasound core biopsy of an abnormal lymph node at the 11 o'clock position of the right breast.  The patient also underwent stereotactic core needle biopsy of a group of microcalcifications over the approximate 6 o'clock position of the right breast.  Review of Systems:  Patient complains of minimal soreness at the biopsy sites.  Exam:  Both biopsy sites over the right breast are clean and dry without signs of hematoma or infection.  Assessment and Plan:  The patient was given the results of her biopsies.  Biopsy of the 4.1 cm mass at the 10 o'clock position is compatible with invasive mammary carcinoma and mammary carcinoma insitu.  Results of the lymph node biopsy at the 10 o'clock position is compatible with metastatic mammary carcinoma.  The results of the stereotactic biopsy of the microcalcifications at the 6 o'clock position is compatible with low grade DCIS.  These results are concordant with imaging findings.  Patient's immediate questions and concerns were addressed.  The patient was given an appointment for breast MRI 04/01/2013 at 09:30 a.m.  The patient was also given an appointment to the [facility] 04/02/2013.  The patient will be contacted by a representative from the [facility] with more detailed information.   Original Report Authenticated By: Elberta Fortis, M.D.    [place] Rt Breast Bx W Loc Dev 1st Lesion Img Bx Spec US Guide  03/27/2013  *RADIOLOGY REPORT*  Clinical Data:  52 year old female with irregular mass in the upper outer right breast and mildly enlarged right lower axillary/upper outer right breast lymph nodes.  For tissue sampling of the irregular right breast mass.  ULTRASOUND GUIDED VACUUM ASSISTED CORE BIOPSY OF THE RIGHT BREAST  Comparison: Previous exams.  I met with the patient and we discussed the  procedure of ultrasound- guided biopsy, including benefits and alternatives.  We discussed the high likelihood of a successful procedure. We discussed the risks of the procedure including infection, bleeding, tissue injury, clip migration, and inadequate sampling.  Informed written consent was given.  Using sterile technique, 2% lidocaine ultrasound guidance and a 12 gauge vacuum assisted needle biopsy was performed of the 3.9 x 2 x 4.1 cm irregular hypoechoic mass at the 10 o'clock position of the right breast 6 cm from the nipple using a lateral approach.  At the conclusion of the procedure, a T shaped tissue marker clip was deployed into the biopsy cavity.  Follow-up 2-view mammogram was performed and dictated separately.  IMPRESSION: Ultrasound-guided biopsy of right breast mass.  No apparent complications.  Pathology will be followed.   Original Report Authenticated By: Harmon Pier, M.D.    [place] Rt Breast Bx W Loc Dev Ea Add Lesion Img Bx Spec US Guide  03/27/2013  *RADIOLOGY REPORT*  Clinical Data:  52 year old female with irregular mass in the upper outer right breast and mildly enlarged right lower axillary/upper outer right breast lymph nodes.  For tissue sampling of one of the enlarged lymph nodes.  ULTRASOUND GUIDED CORE BIOPSY OF THE RIGHT BREAST  Comparison: Previous exams.  I met with the patient and we discussed the  procedure of ultrasound- guided biopsy, including benefits and alternatives.  We discussed the high likelihood of a successful procedure. We discussed the risks of the procedure, including infection, bleeding, tissue injury, clip migration, and inadequate sampling.  Informed written consent was given.  Using sterile technique 2% lidocaine, ultrasound guidance and a 14 gauge automated biopsy device, biopsy was performed of one of the enlarged lymph nodes in the lower right axilla/upper outer right breast using a lateral approach.  No immediate complication identified.  IMPRESSION: Ultrasound  guided biopsy of enlarged lymph node in the lower right axilla/upper outer right breast.  No apparent complications.  Pathology will be followed.   Original Report Authenticated By: Harmon Pier, M.D.     ASSESSMENT: 52 y.o. 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 There were also 2 suspicious areas in the left breast noted by MRI.  (2) s/p bilateral mastectomies with Left sentinel node sampling and Right axillary node dissection, showing  (a) on the left, no malignancy, negative sentinel node  (b) on the right, invasive lobular adenocarcinoma pT2 pN2, stage IIIA, grade 2, repeat HER-2 again not amplified  (3) genetics testing pending  PLAN: Patient was scheduled to meet with me 04/25/2013 but had to reschedule because of several family problems. She will meet with my APP June 3d to discuss the results of her staging studies (negative), need to reschedule echo (no show 05/14) and appropriate use of antiemetics for her chemo, which will consist of TAC Q 3 weeks x 6 to be followed by radiation, then antiestrogens  Lowella Dell, MD   04/25/2013

## 2013-04-29 ENCOUNTER — Encounter: Payer: Self-pay | Admitting: Oncology

## 2013-04-29 ENCOUNTER — Encounter (INDEPENDENT_AMBULATORY_CARE_PROVIDER_SITE_OTHER): Payer: Self-pay | Admitting: Surgery

## 2013-04-29 ENCOUNTER — Encounter: Payer: Self-pay | Admitting: *Deleted

## 2013-04-29 ENCOUNTER — Ambulatory Visit (HOSPITAL_BASED_OUTPATIENT_CLINIC_OR_DEPARTMENT_OTHER): Payer: Medicaid Other | Admitting: Physician Assistant

## 2013-04-29 ENCOUNTER — Encounter: Payer: Self-pay | Admitting: Physician Assistant

## 2013-04-29 ENCOUNTER — Other Ambulatory Visit: Payer: Self-pay | Admitting: *Deleted

## 2013-04-29 ENCOUNTER — Telehealth: Payer: Self-pay | Admitting: *Deleted

## 2013-04-29 ENCOUNTER — Ambulatory Visit (INDEPENDENT_AMBULATORY_CARE_PROVIDER_SITE_OTHER): Payer: Medicaid Other | Admitting: Surgery

## 2013-04-29 ENCOUNTER — Other Ambulatory Visit: Payer: Medicaid Other

## 2013-04-29 VITALS — BP 133/80 | HR 81 | Temp 98.6°F | Resp 20 | Ht 62.0 in | Wt 211.1 lb

## 2013-04-29 VITALS — BP 138/78 | HR 72 | Temp 97.4°F | Resp 18 | Ht 62.0 in | Wt 210.8 lb

## 2013-04-29 DIAGNOSIS — C50411 Malignant neoplasm of upper-outer quadrant of right female breast: Secondary | ICD-10-CM

## 2013-04-29 DIAGNOSIS — R5383 Other fatigue: Secondary | ICD-10-CM

## 2013-04-29 DIAGNOSIS — C773 Secondary and unspecified malignant neoplasm of axilla and upper limb lymph nodes: Secondary | ICD-10-CM

## 2013-04-29 DIAGNOSIS — Z09 Encounter for follow-up examination after completed treatment for conditions other than malignant neoplasm: Secondary | ICD-10-CM

## 2013-04-29 DIAGNOSIS — F341 Dysthymic disorder: Secondary | ICD-10-CM

## 2013-04-29 DIAGNOSIS — C50419 Malignant neoplasm of upper-outer quadrant of unspecified female breast: Secondary | ICD-10-CM

## 2013-04-29 MED ORDER — LIDOCAINE-PRILOCAINE 2.5-2.5 % EX CREA: TOPICAL_CREAM | CUTANEOUS | Status: DC | PRN

## 2013-04-29 MED ORDER — TOBRAMYCIN-DEXAMETHASONE 0.3-0.1 % OP SUSP: 1.0000 [drp] | Freq: Two times a day (BID) | OPHTHALMIC | Status: DC

## 2013-04-29 MED ORDER — PROCHLORPERAZINE MALEATE 10 MG PO TABS: ORAL_TABLET | ORAL | Status: DC

## 2013-04-29 MED ORDER — LORATADINE 10 MG PO TABS: ORAL_TABLET | ORAL | Status: DC

## 2013-04-29 MED ORDER — DEXAMETHASONE 4 MG PO TABS: ORAL_TABLET | ORAL | Status: DC

## 2013-04-29 NOTE — Progress Notes (Signed)
ID: [name] [name] OB: 08-02-1961  MR#: [id number]  [name] [name]  PCP: [name] Ip, MD GYN:   SU: [name] [name] OTHER MD: [name] [name]   HISTORY OF PRESENT ILLNESS: [name] [name] tells me she has not been able to have mammography for several years because of cost issues. More recently, she had noted a mass in her right breast but thought this might be MRSA. The urinary tract infection took her to her physician's office, and she brought the breast mass to the attention of [name] [name]. She set up the patient for bilateral diagnostic mammography and right breast ultrasound at the [facility] 03/27/2013, which showed a 4 cm irregular mass in the upper outer right breast, with a 9 mm cluster of calcifications in the posterior lower right breast and a few mildly enlarged level I right axillary lymph nodes. The breast mass was palpable and ultrasound showed it to measure 4.1 cm, with some of the level I right axillary lymph nodes noted to have lost their fatty hilum.  Biopsy of both the suspicious areas in the right breast as well as one of the suspicious lymph nodes was performed 03/27/2013 and showed ([id number]) the larger breast mass to consist of an invasive ductal carcinoma with lobular features, grade 2, estrogen and progesterone receptor both 100% positive, with an MIB-1 of 15%, and no HER-2 amplification. 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 The sampled right axillary lymph node was also positive.  Bilateral breast MRI 04/01/2013 showed the larger right breast mass to measure 5.4 cm, with a satellite nodule immediately inferior to it measuring 8 mm. 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 There were multiple enlarged right axillary lymph nodes, the largest measuring 2.8 cm. In addition, in the left breast there were 2 foci on non-masslike enhancement consistent with DCIS  measuring 1.3 cm and 2.2 cm. These weren't different quadrants.  The patient's subsequent history is as noted below  INTERVAL HISTORY: [name] [name] returns today for followup of her right breast carcinoma. She is status post bilateral mastectomies on 04/07/2013 under the care of [name] [name]. She tolerated the surgery well, and is healing well with no complications. She has also had staging scans which were negative. She is ready to discuss initiation of adjuvant chemotherapy at this time, the plan being to treat with 6 cycles of docetaxel/doxorubicin/cyclophosphamide given every 3 weeks.  [name] [name] is extremely nervous and anxious today. She is "scared about chemotherapy" and we spent a lot of time discussing this today. Physically, her primary complaint is fatigue. Fortunately, she's had no fevers or chills. She has had no recent pain, but I will mention that she was recently hospitalized (04/17/2013 to 04/19/2013) for an apparent allergic reaction to Vicodin.   REVIEW OF SYSTEMS: [name] [name] denies any rashes and has had no abnormal bruising or bleeding. Her appetite is good and she currently denies any nausea or recent change in bowel or bladder habits. She's had no new cough, increased shortness of breath or phlegm production. She denies orthopnea. She's had no chest pain or palpitations.   A detailed review of systems is otherwise stable and noncontributory.   PAST MEDICAL HISTORY: Past Medical History  Diagnosis Date  . Hypertension   . Anxiety   . Depression   . Headache(784.0)   . Sleep apnea     [name] [name]  . Shortness of breath   . Breast  cancer     PAST SURGICAL HISTORY: Past Surgical History  Procedure Laterality Date  . Mastectomy, radical Right 04/08/2013  . Simple mastectomy with axillary sentinel node biopsy Left 04/08/2013  . Mastectomy modified radical Right 04/07/2013    Procedure: RIGHT MASTECTOMY MODIFIED RADICAL;  Surgeon: [name] Cocking, MD;  Location: [name];  Service: General;   Laterality: Right;  . Simple mastectomy with axillary sentinel node biopsy Left 04/07/2013    Procedure:  LEFT SMPLE MASTECTOMY WITH AXILLARY SENTINEL NODE BIOPSY;  Surgeon: [name] Cocking, MD;  Location: [facility] OR;  Service: General;  Laterality: Left;  . Portacath placement Left 04/07/2013    Procedure: INSERTION PORT-A-CATH;  Surgeon: [name] Cocking, MD;  Location: [facility] OR;  Service: General;  Laterality: Left;    FAMILY HISTORY Family History  Problem Relation Age of Onset  . Breast cancer Mother 31  . Breast cancer Maternal Grandmother 52  . Prostate cancer Paternal Grandfather 44   the patient's father died at age 93 in an airplane crash (he was a Transport planner unsupervised). The patient's mother is alive at age 74. Her name is [name] [name] and she works for [employer]. [name] [name] has a history of breast cancer diagnosed at age 62. She had one sister who is alive and well. [name] [name]'s mother, the patient's grandmother, was diagnosed with breast cancer at age 49. She had 2 sisters, neither with breast cancer. [name] [name] herself had one brother, no sisters. There is no other history of breast or ovary cancer in the family.  GYNECOLOGIC HISTORY:  Menarche age 16, first live birth age 24. The patient was taking birth control pills until September. She stopped having periods at that time, but had one in late March.  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 Her husband [name] [name] (goes by "[name] [name]") is disabled secondary to diabetes. They are separated, but not divorced, and remain on good terms. [name] [name] lives with her mother [name] [name] and 8 pools. The patient's daughter [name] [name] "[name] [name]" [name] also lives with the patient.    ADVANCED DIRECTIVES: In place. The patient has named her daughter [name] [name] as her healthcare power of attorney, bypassing her husband.  HEALTH MAINTENANCE: History  Substance Use Topics  . Smoking status: Never Smoker   . Smokeless tobacco: Never Used  .  Alcohol Use: No     Colonoscopy: Never  PAP: Possibly 2006  Bone density: Possibly 2004  Lipid panel:  Allergies  Allergen Reactions  . Vicodin (Hydrocodone-Acetaminophen) Anaphylaxis  . Codeine Nausea And Vomiting  . Penicillins Rash  . Percocet (Oxycodone-Acetaminophen) Itching  . Septra (Bactrim) Other (See Comments)    Heart races    Current Outpatient Prescriptions  Medication Sig Dispense Refill  . acetaminophen (TYLENOL) 500 MG tablet Take 1,000 mg by mouth 2 (two) times daily as needed. For pain      . ALPRAZolam (XANAX) 0.25 MG tablet Take 1 tablet by mouth Three times a day.      . docusate sodium 100 MG CAPS Take 200 mg by mouth 2 (two) times daily.  10 capsule  0  . doxycycline (VIBRA-TABS) 100 MG tablet Take 1 tablet (100 mg total) by mouth 2 (two) times daily.  14 tablet  2  . escitalopram (LEXAPRO) 10 MG tablet Take 1 tablet by mouth daily.      . hydrocortisone (ANUSOL-HC) 25 MG suppository Place 1 suppository (25 mg total) rectally 2 (two) times daily.  100 suppository  0  .  ondansetron (ZOFRAN) 4 MG tablet Take 4 mg by mouth every 8 (eight) hours as needed for nausea.      . traMADol (ULTRAM) 50 MG tablet Take 1 tablet (50 mg total) by mouth every 8 (eight) hours as needed for pain.  30 tablet  0  . dexamethasone (DECADRON) 4 MG tablet 2 tabs by mouth twice daily with food on day before chemo and 3 days after chemo  30 tablet  2  . diphenhydrAMINE (BENADRYL) 25 mg capsule Take 1 capsule (25 mg total) by mouth 3 (three) times daily.  30 capsule  0  . lidocaine-prilocaine (EMLA) cream Apply topically as needed. 1-2 hours before each chemo  30 g  1  . loratadine (CLARITIN) 10 MG tablet 1 tablet by mouth once daily for 5 days after each dose of chemo  30 tablet  1  . prochlorperazine (COMPAZINE) 10 MG tablet 1 tab by mouth with meals and bedtime x 3 days after chemo, then every 6 hrs as needed for nausea  30 tablet  2  . tobramycin-dexamethasone (TOBRADEX) ophthalmic  solution Place 1 drop into both eyes 2 (two) times daily. For 7 days after each dose of chemo  5 mL  1   No current facility-administered medications for this visit.    OBJECTIVE: Middle-aged white woman in no acute distress Filed Vitals:   04/29/13 1034  BP: 133/80  Pulse: 81  Temp: 98.6 F (37 C)  Resp: 20     Body mass index is 38.6 kg/(m^2).    ECOG FS: 1 Filed Weights   04/29/13 1034  Weight: 211 lb 1.6 oz (95.754 kg)   Full physical exam was deferred today.    LAB RESULTS:  Lab Results  Component Value Date   WBC 22.7* 04/19/2013   NEUTROABS 22.3* 04/17/2013   HGB 10.1* 04/19/2013   HCT 30.6* 04/19/2013   MCV 91.1 04/19/2013   PLT 244 04/19/2013      Chemistry      Component Value Date/Time   NA 141 04/19/2013 0815   NA 139 04/02/2013 0844   K 3.8 04/19/2013 0815   K 3.6 04/02/2013 0844   CL 106 04/19/2013 0815   CL 100 04/02/2013 0844   CO2 24 04/19/2013 0815   CO2 29 04/02/2013 0844   BUN 14 04/19/2013 0815   BUN 13.9 04/02/2013 0844   CREATININE 0.79 04/19/2013 0815   CREATININE 0.9 04/02/2013 0844      Component Value Date/Time   CALCIUM 8.8 04/19/2013 0815   CALCIUM 9.3 04/02/2013 0844   ALKPHOS 63 04/17/2013 0741   ALKPHOS 61 04/02/2013 0844   AST 23 04/17/2013 0741   AST 11 04/02/2013 0844   ALT 26 04/17/2013 0741   ALT 14 04/02/2013 0844   BILITOT 0.2* 04/17/2013 0741   BILITOT <0.20 Repeated and Verified 04/02/2013 0844       STUDIES:  Echocardiogram is scheduled for 05/01/2013.    Ct Chest W Contrast  04/04/2013   *RADIOLOGY REPORT*  Clinical Data: Stage III breast cancer  CT CHEST WITH CONTRAST  Technique:  Multidetector CT imaging of the chest was performed following the standard protocol during bolus administration of intravenous contrast.  Contrast: 80mL OMNIPAQUE IOHEXOL 300 MG/ML  SOLN  Comparison: None  Findings: Right breast mass is again noted within the periphery of the right breast.  This is better defined on recent MRI.  There are multiple right axillary  lymph nodes.  These are hypermetabolic on PET CT from  today.  Index node measures 8 mm, image 16/series 2. Adjacent index lymph node measures 7 mm, image 6/series 2.  Left axillary adenopathy.  There is no supraclavicular adenopathy.  The heart size appears normal.  No pericardial effusion.  There is no enlarged mediastinal or hilar lymph nodes.  No evidence for internal mammary adenopathy.  There is no pleural effusion identified.  Several tiny subpleural nodules are identified along the minor fissure.  These measure up to 3 mm, image 32/series 5.  Mild spondylosis identified within the thoracic spine. There is no worrisome lytic or sclerotic bone lesions identified.  Limited imaging through the upper abdomen low attenuation nodule in the left adrenal gland measures 3.5 HU and 1.2 cm, image 57/series 2.  Likely benign adenoma.  IMPRESSION:  1.  Right breast mass consistent with primary breast carcinoma. 2.  Sub centimeter right axillary lymph nodes are hypermetabolic on PET CT from today and are concerning for metastatic adenopathy. 3.  Small nonspecific nodules along the minor fissure of the right lung. Attention on follow-up imaging advised.   Original Report Authenticated By: [name] [name], M.D.   Ct Angio Chest W/cm &/or Wo Cm  04/17/2013   *RADIOLOGY REPORT*  Clinical Data: Chest pain. Recent mastectomy.  CT ANGIOGRAPHY CHEST  Technique:  Multidetector CT imaging of the chest using the standard protocol during bolus administration of intravenous contrast. Multiplanar reconstructed images including MIPs were obtained and reviewed to evaluate the vascular anatomy.  Contrast: OMNIPAQUE IOHEXOL 350 MG/ML SOLN  Comparison: 04/14/2013.  Findings: The chest wall demonstrates a recent surgical changes from bilateral mastectomies.  Expected postoperative findings.  A left-sided Port-A-Cath is noted.  The bony thorax is intact.  No destructive bone lesions or spinal canal compromise.  Degenerative changes are  noted throughout the thoracic spine.  The heart is normal in size.  No pericardial effusion.  No mediastinal or hilar lymphadenopathy.  The esophagus is grossly normal.  The aorta is normal in caliber.  No dissection.  The pulmonary arterial tree is well opacified.  No filling defects to suggest pulmonary emboli.  Examination of the lung parenchyma demonstrates no acute pulmonary findings other than dependent bibasilar atelectasis.  No infiltrates, edema or effusions.  No worrisome pulmonary nodules or masses.  No bronchiectasis.  The upper abdomen is unremarkable.  IMPRESSION:  1.  Expected surgical changes from bilateral mastectomies. 2.  No CT findings for pulmonary embolism. 3.  Normal thoracic aorta. 4.  No acute pulmonary findings.   Original Report Authenticated By: [name] [name], M.D.    Nm Pet Image Initial (pi) Skull Base To Thigh  04/04/2013   *RADIOLOGY REPORT*  Clinical Data: Initial treatment strategy for stage III breast cancer.  NUCLEAR MEDICINE PET SKULL BASE TO THIGH  Fasting Blood Glucose:  96  Technique:  15.1 mCi F-18 FDG was injected intravenously. CT data was obtained and used for attenuation correction and anatomic localization only.  (This was not acquired as a diagnostic CT examination.) Additional exam technical data entered on technologist worksheet.  Comparison:  None  Findings:  Neck: No hypermetabolic lymph nodes in the neck.  Chest:  No hypermetabolic mediastinal or hilar nodes.  No suspicious pulmonary nodules on the CT scan. Right breast mass measures 4.1 cm and has an SUV max equal to 17.30 right axillary lymph nodes are identified.  There is increased FDG uptake associated these lymph nodes.  Index lymph node has an SUV max equal to 6.9, image 72.  Abdomen/Pelvis:  No  abnormal hypermetabolic activity within the liver, pancreas, adrenal glands, or spleen.  Low attenuation nodule in the left adrenal gland likely represents a benign adenoma, image 129/series 2.  No hypermetabolic  lymph nodes in the abdomen or pelvis.  Skeleton:  No focal hypermetabolic activity to suggest skeletal metastasis.  IMPRESSION:  1.  Mass in the right breast is intensely hypermetabolic consistent with primary breast carcinoma. 2.  Hypermetabolic right axillary lymph nodes compatible with metastatic adenopathy. 3.  No evidence for distant metastatic disease.   Original Report Authenticated By: [name] [name], M.D.    Dg Chest Portable 1 View  04/16/2013   *RADIOLOGY REPORT*  Clinical Data: Shortness of breath.  PORTABLE CHEST - 1 VIEW  Comparison: 04/07/2013.  Findings: The cardiac silhouette, mediastinal and hilar contours are normal.  The left subclavian power port is stable.  The lungs are clear.  No pleural effusion.  No pneumothorax.  IMPRESSION: No acute cardiopulmonary findings.   Original Report Authenticated By: [name] [name], M.D.     Dg Abd Portable 1v  04/19/2013   *RADIOLOGY REPORT*  Clinical Data: Rectal pain.  Constipation.  PORTABLE ABDOMEN - 1 VIEW  Comparison: None.  Findings: Normal bowel gas pattern.  Lumbar spine degenerative changes.  IMPRESSION: No acute abnormality.   Original Report Authenticated By: [name] [name], M.D.     ASSESSMENT: 52 y.o. 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 There were also 2 suspicious areas in the left breast noted by MRI.  (2) s/p bilateral mastectomies with Left sentinel node sampling and Right axillary node dissection, showing  (a) on the left, no malignancy, negative sentinel node  (b) on the right, invasive lobular adenocarcinoma pT2 pN2, stage IIIA, grade 2, repeat  HER-2 again not amplified  (3) genetics testing pending  (4)  due to initiate adjuvant chemotherapy, consisting of 6 cycles of docetaxel/doxorubicin/cyclophosphamide given on a Q. three-week basis, first dose scheduled for 05/21/2013.  Neulasta to be given on day 2 for granulocyte support.  Adjuvant chemotherapy will be followed by postmastectomy radiation therapy, then antiestrogen.  PLAN:  Our entire one-hour appointment was spent answering Laporcha's questions, addressing her concerns, reviewing her treatment plan, and coordinating care. She was given all information in writing today, and both she and her daughter [name] [name] voiced understanding of our plan.  Her daughter will be helping her manage her medications.  She'll be utilizing dexamethasone (2 tablets twice daily with food on the day before and 3 days after each dose of chemotherapy); prochlorperazine (one tablet with meals and bedtime for 3 days after chemotherapy, then every 6 hours as needed for nausea); Claritin (1 tablet daily for 5 days after each treatment); TobraDex eyedrops (1 drop in each eye twice daily for 7 days after each treatment); and EMLA cream (apply 1-2 hours prior to each procedure). All these medications were prescribed for her today. She takes Xanax regularly, usually 3 times daily, so at this time I will let her continue that and will not prescribe lorazepam. We will reevaluate as we go along and make changes as necessary.  Eiza will attend chemotherapy class later today, and will see  Dr. Jamey Ripa this afternoon to have her staples removed. She's already scheduled for her echocardiogram later this week. Her daughter is having a procedure on June 19, so Milayna has requested that we start chemotherapy the following week on June 25.  I will see her again for followup on June 24, primarily to review her anti-nausea regimen and her treatment plan once again. I have asked her to bring all of her medications, as well as her written  instructions, so that we may review all of these adequately. She will receive her first dose of chemotherapy on June 25, her Neulasta injection on June 26, and I'll see her the following week for assessment of chemotoxicity.  I also referred to Gianella to meet with our financial counselors today. Again, she and Christy both voice understanding and agreement with this plan. They do know to call with any changes or problems prior to her next appointment.  Jennings Corado, PA-C   04/29/2013

## 2013-04-29 NOTE — Patient Instructions (Signed)
Apply sterile gauze dressing as needed over the open area of the right mastectomy incision. Continue your antibiotics. It's okay to continue to shower. Call us as there are any problems. See [place] again in about 10 days.

## 2013-04-29 NOTE — Progress Notes (Signed)
RECEIVED A FAX FROM [facility]CERNING A PRIOR AUTHORIZATION FOR TOBRAMYCIN/DEXAMETHASONE EYE SUSPENSION.THIS REQUEST WAS PLACED IN THE MANAGED CARE BIN.

## 2013-04-29 NOTE — Telephone Encounter (Signed)
appts made and printed. Pt is aware that MW will add on her tx. i also emailed MW to add tx...td

## 2013-04-29 NOTE — Progress Notes (Signed)
Called [facility] about tobramycin/dexamethasone eye drop prior auth, per rep pharmacy needs to run brand instead of generic, no pa required.

## 2013-04-29 NOTE — Progress Notes (Signed)
She comes back for a recheck today. She feels much better. She continues to have a little bit of drainage from between some of the staples on the right. There is no drainage on the left.  On exam the site is healing nicely and there is no residual seroma. On the right the erythema that was noticed at last visit has almost completely resolved. However, there is a little bit of fluid in the medial lower flap area which I aspirated and cultured.  Impression: Improving but likely wound infection on the right  Plan: Remaining staples removed. I believe will need to be packing the medial portion of the right mastectomy site but I think she continue shower at home. She'll continue her antibiotics. She come back in about 10 days for followup visit.

## 2013-04-29 NOTE — Telephone Encounter (Signed)
Per staff message and POF I have scheduled appts.  JMW  

## 2013-05-01 ENCOUNTER — Ambulatory Visit (HOSPITAL_COMMUNITY)
Admission: RE | Admit: 2013-05-01 | Discharge: 2013-05-01 | Disposition: A | Discharge status: Home / Self Care | Payer: Medicaid Other | Source: Ambulatory Visit | Attending: Oncology | Admitting: Oncology

## 2013-05-01 DIAGNOSIS — C50411 Malignant neoplasm of upper-outer quadrant of right female breast: Secondary | ICD-10-CM

## 2013-05-01 DIAGNOSIS — I517 Cardiomegaly: Secondary | ICD-10-CM

## 2013-05-01 DIAGNOSIS — I10 Essential (primary) hypertension: Secondary | ICD-10-CM | POA: Insufficient documentation

## 2013-05-01 DIAGNOSIS — Z01818 Encounter for other preprocedural examination: Secondary | ICD-10-CM | POA: Insufficient documentation

## 2013-05-01 DIAGNOSIS — C50419 Malignant neoplasm of upper-outer quadrant of unspecified female breast: Secondary | ICD-10-CM | POA: Insufficient documentation

## 2013-05-01 DIAGNOSIS — Z9010 Acquired absence of unspecified breast and nipple: Secondary | ICD-10-CM | POA: Insufficient documentation

## 2013-05-01 NOTE — Progress Notes (Signed)
Echocardiogram 2D Echocardiogram has been performed.  Prabhjot Maddux 05/01/2013, 9:49 AM

## 2013-05-02 ENCOUNTER — Ambulatory Visit: Payer: Medicaid Other | Admitting: Oncology

## 2013-05-02 ENCOUNTER — Encounter: Payer: Self-pay | Admitting: Genetic Counselor

## 2013-05-02 ENCOUNTER — Other Ambulatory Visit: Payer: Medicaid Other | Admitting: Lab

## 2013-05-02 LAB — WOUND CULTURE

## 2013-05-06 ENCOUNTER — Encounter: Payer: Self-pay | Admitting: Oncology

## 2013-05-06 NOTE — Progress Notes (Signed)
Put mother's fmla form on nurse's desk. °

## 2013-05-08 ENCOUNTER — Encounter: Payer: Self-pay | Admitting: Oncology

## 2013-05-08 NOTE — Progress Notes (Signed)
Faxed fmla form to [phone].

## 2013-05-15 ENCOUNTER — Encounter (INDEPENDENT_AMBULATORY_CARE_PROVIDER_SITE_OTHER): Payer: Self-pay | Admitting: Surgery

## 2013-05-15 ENCOUNTER — Ambulatory Visit (INDEPENDENT_AMBULATORY_CARE_PROVIDER_SITE_OTHER): Payer: Medicaid Other | Admitting: Surgery

## 2013-05-15 VITALS — BP 138/88 | HR 82 | Temp 97.6°F | Resp 14 | Ht 62.0 in | Wt 208.6 lb

## 2013-05-15 DIAGNOSIS — C50411 Malignant neoplasm of upper-outer quadrant of right female breast: Secondary | ICD-10-CM

## 2013-05-15 DIAGNOSIS — K6289 Other specified diseases of anus and rectum: Secondary | ICD-10-CM

## 2013-05-15 DIAGNOSIS — C50419 Malignant neoplasm of upper-outer quadrant of unspecified female breast: Secondary | ICD-10-CM

## 2013-05-15 NOTE — Progress Notes (Signed)
Re:   [name] [name] DOB:   1961/04/24 MRN:   [id number]  [facility]  ASSESSMENT AND PLAN: 1.  Right breast cancer - two areas - one IDC (at 9 o'clock and palpable) and one is DCIS (5 o'clock) pT2, pN2a  Invasive Lobular Ca, Grade II/III, ER - 90%, PR - 100%, Her2Neu - negative, Ki-67 - 15%  Final path - 4.4 cm IDC, 9/19 nodes positive (level 2 node behind pect minor was negative)  Treating oncologist - [name] [name]  Bilateral mastectomies - 04/08/2013  (she had a rough time early after surgery, but is doing much better now)  For chemotx.  I'll see her back in 6 months.   1a.  Will need genetics 2.  Left mastectomy - [name] [name] - 04/08/2013  Final path report - Radial scar, 0/1 nodes.  3.  Sleep apnea  On CPAP x 8 years 4.  Hypertension 5.  Morbid obesity 6.  Questionable recent history of MRSA 7.  History of urinary tract infections. 8.  History of anxiety 9.  Anal fissure  I gave a book on fissures.  I gave a prescription for Diltiazem cream.  She says that she has had this pain for a while, but she has not brought this up with me before.  Chief Complaint  Patient presents with  . Routine Post Op    f/u br ca   REFERRING PHYSICIAN: Raliegh Ip, MD  HISTORY OF PRESENT ILLNESS: [name] [name] is a 52 y.o. (DOB: June 11, 1961)  white  female whose primary care physician is [name] Ip, MD [name]) and comes to me today for follow up of right breast cancer and bilateral mastectomies.  Her daughter, [name] [name] with her.   She is doing much better than when I last saw her.  He one complaint is rectal pain.  She has not presented this to me before.  We discussed follow up.  And I discussed with her her anal fissure.  I gave her literature on the fissures.  Breast History: The patient has had no prior breast biopsy or breast disease. Her last mammograms were around 2009. She says she cannot for mammograms in the intervening years. She recently had a urinary tract  infection and was seen at the [facility] emergency room at the end of April.  They recognized that she had an abnormal right breast suggested that she have mammograms. She said she felt she had a right axillary infection that was being treated for MRSA.  On 03/27/2013 she underwent a mammogram that showed a 4.1 cm irregular lesion at the 10:00 position of the right breast. She also had an abnormality at the 5:00 position of the right breast. 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  She subsequently underwent an MRI of both breast on 04/01/2013 showed the lesions on the right,  It also showed 2 foci of masslike enhancement at the 7:00 position and the 5:00 position in the left breast. Biopsies were suggested for these lesions.  The patient had her last menstrual period September 2013. She stopped birth-control pills around the same time. Recently she had another period.  Her grandmother had breast cancer. Her mother had breast cancer over 30 years ago. Her mother had bilateral mastectomies with recostruction. I think this has helped the patient in her decisions about treatment.  She has one daughter  who is with her today.  Past Medical History  Diagnosis Date  . Hypertension   . Anxiety   . Depression   . Headache(784.0)   . Sleep apnea     cpcp  . Shortness of breath   . Breast cancer     Past Surgical History  Procedure Laterality Date  . Mastectomy, radical Right 04/08/2013  . Simple mastectomy with axillary sentinel node biopsy Left 04/08/2013  . Mastectomy modified radical Right 04/07/2013    Procedure: RIGHT MASTECTOMY MODIFIED RADICAL;  Surgeon: Kandis Cocking, MD;  Location: [facility] OR;  Service: General;  Laterality: Right;  . Simple mastectomy with axillary sentinel node biopsy Left 04/07/2013    Procedure:  LEFT SMPLE  MASTECTOMY WITH AXILLARY SENTINEL NODE BIOPSY;  Surgeon: Kandis Cocking, MD;  Location: [facility] OR;  Service: General;  Laterality: Left;  . Portacath placement Left 04/07/2013    Procedure: INSERTION PORT-A-CATH;  Surgeon: Kandis Cocking, MD;  Location: [facility] OR;  Service: General;  Laterality: Left;      Current Outpatient Prescriptions  Medication Sig Dispense Refill  . acetaminophen (TYLENOL) 500 MG tablet Take 1,000 mg by mouth 2 (two) times daily as needed. For pain      . ALPRAZolam (XANAX) 0.25 MG tablet Take 1 tablet by mouth Three times a day.      Marland Kitchen dexamethasone (DECADRON) 4 MG tablet 2 tabs by mouth twice daily with food on day before chemo and 3 days after chemo  30 tablet  2  . docusate sodium 100 MG CAPS Take 200 mg by mouth 2 (two) times daily.  10 capsule  0  . doxycycline (VIBRA-TABS) 100 MG tablet Take 1 tablet (100 mg total) by mouth 2 (two) times daily.  14 tablet  2  . escitalopram (LEXAPRO) 10 MG tablet Take 1 tablet by mouth daily.      Marland Kitchen lidocaine-prilocaine (EMLA) cream Apply topically as needed. 1-2 hours before each chemo  30 g  1  . loratadine (CLARITIN) 10 MG tablet 1 tablet by mouth once daily for 5 days after each dose of chemo  30 tablet  1  . ondansetron (ZOFRAN) 4 MG tablet Take 4 mg by mouth every 8 (eight) hours as needed for nausea.      . prochlorperazine (COMPAZINE) 10 MG tablet 1 tab by mouth with meals and bedtime x 3 days after chemo, then every 6 hrs as needed for nausea  30 tablet  2  . tobramycin-dexamethasone (TOBRADEX) ophthalmic solution Place 1 drop into both eyes 2 (two) times daily. For 7 days after each dose of chemo  5 mL  1  . traMADol (ULTRAM) 50 MG tablet Take 1 tablet (50 mg total) by mouth every 8 (eight) hours as needed for pain.  30 tablet  0   No current facility-administered medications for this visit.      Allergies  Allergen Reactions  . Vicodin (Hydrocodone-Acetaminophen) Anaphylaxis  . Codeine Nausea And Vomiting  . Penicillins  Rash  . Percocet (Oxycodone-Acetaminophen) Itching  . Septra (Bactrim) Other (See Comments)    Heart races    REVIEW OF SYSTEMS: Skin:  No history of rash.  No history of abnormal moles. Infection:  She said that she has been treated for MRSA since Sept of last year, but in part this seems to be self treatment with out bacteriology. Neurologic:  No history of stroke.  No history of seizure.  No history of headaches. Cardiac:  No history of  hypertension. No history of heart disease.  No history of prior cardiac catheterization.  No history of seeing a cardiologist. Pulmonary:  Has OSA/CPAP x 8 years.  Sees Dr. Melba Coon, whom she last saw about 1 and 1/2 years ago.  Endocrine:  No diabetes. No thyroid disease. Gastrointestinal:  No history of stomach disease.  No history of liver disease.  No history of gall bladder disease.  No history of pancreas disease.  No history of colon disease. Urologic:  Has been to ER twice in last 6 months for "bad" kidney infections (but not hospitalized). GYN:  One daughter. 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  Has had cortisone shots.  Right knee worse.  Dr. August Saucer is ortho. Hematologic:  No bleeding disorder.  No history of anemia.  Not anticoagulated. Psycho-social:  The patient has an anxious personality.  SOCIAL and FAMILY HISTORY: Divorced.  Lives with mother. At initial visit, she had her mother, [name], and her daughter, Margueritte Guthridge with her. Unemployed as of Sept 2013.  Did work as a Sales executive.   PHYSICAL EXAM: BP 138/88  Pulse 82  Temp(Src) 97.6 F (36.4 C) (Temporal)  Resp 14  Ht 5\' 2"  (1.575 m)  Wt 208 lb 9.6 oz (94.62 kg)  BMI 38.14 kg/m2  General: Obese WF who is alert but worried. HEENT: Normal. Pupils equal. Lymph Nodes:  No supraclavicular or cervical nodes.  Lungs: Clear Heart:  RRR.  No murmur. Breast:  Right:  Mastectomy wound looks good.    Left:  Mastectomy wound looks good.  Chest:  Port left  subclavian is okay. Rectal:  Anterior hemorrhoid tag.  Anal spasm with evidence of a rectal fissure.  DATA REVIEWED: Echo was okay.  Ovidio Kin, MD,  [facility] [address] [place]  [address]   [place], [place]    [address] Phone:  [phone] FAX:  [phone]

## 2013-05-20 ENCOUNTER — Encounter: Payer: Self-pay | Admitting: Physician Assistant

## 2013-05-20 ENCOUNTER — Ambulatory Visit (HOSPITAL_BASED_OUTPATIENT_CLINIC_OR_DEPARTMENT_OTHER): Payer: Medicaid Other | Admitting: Physician Assistant

## 2013-05-20 ENCOUNTER — Telehealth: Payer: Self-pay | Admitting: *Deleted

## 2013-05-20 ENCOUNTER — Other Ambulatory Visit (HOSPITAL_BASED_OUTPATIENT_CLINIC_OR_DEPARTMENT_OTHER): Payer: Medicaid Other | Admitting: Lab

## 2013-05-20 VITALS — BP 142/84 | HR 82 | Temp 98.9°F | Resp 20 | Ht 62.0 in | Wt 210.8 lb

## 2013-05-20 DIAGNOSIS — C50419 Malignant neoplasm of upper-outer quadrant of unspecified female breast: Secondary | ICD-10-CM

## 2013-05-20 DIAGNOSIS — C50411 Malignant neoplasm of upper-outer quadrant of right female breast: Secondary | ICD-10-CM

## 2013-05-20 LAB — CBC WITH DIFFERENTIAL/PLATELET
BASO%: 0.5 % (ref 0.0–2.0)
Basophils Absolute: 0.1 10*3/uL (ref 0.0–0.1)
EOS%: 1.6 % (ref 0.0–7.0)
HCT: 34.5 % — ABNORMAL LOW (ref 34.8–46.6)
HGB: 11.4 g/dL — ABNORMAL LOW (ref 11.6–15.9)
MCH: 29.4 pg (ref 25.1–34.0)
MCHC: 33.1 g/dL (ref 31.5–36.0)
MCV: 88.8 fL (ref 79.5–101.0)
MONO%: 4.3 % (ref 0.0–14.0)
NEUT%: 67 % (ref 38.4–76.8)

## 2013-05-20 LAB — COMPREHENSIVE METABOLIC PANEL (CC13)
AST: 9 U/L (ref 5–34)
Alkaline Phosphatase: 71 U/L (ref 40–150)
BUN: 9.9 mg/dL (ref 7.0–26.0)
Creatinine: 1 mg/dL (ref 0.6–1.1)
Total Bilirubin: 0.23 mg/dL (ref 0.20–1.20)

## 2013-05-20 NOTE — Progress Notes (Signed)
ID: [name] OB: [birth year]  MR#: [id number]  WUJ#:[id number]  PCP: [name] [name] Ip, MD GYN:   SU: [name] [name] [name] OTHER MD: [name] [name] [name]   HISTORY OF PRESENT ILLNESS: [name] [name] [name] tells me she has not been able to have mammography for several years because of cost issues. More recently, she had noted a mass in her right breast but thought this might be MRSA. The urinary tract infection took her to her physician's office, and she brought the breast mass to the attention of [name] [name] Pisciotta NP. She set up the patient for bilateral diagnostic mammography and right breast ultrasound at the [facility] Center 03/27/2013, which showed a 4 cm irregular mass in the upper outer right breast, with a 9 mm cluster of calcifications in the posterior lower right breast and a few mildly enlarged level I right axillary lymph nodes. The breast mass was palpable and ultrasound showed it to measure 4.1 cm, with some of the level I right axillary lymph nodes noted to have lost their fatty hilum.  Biopsy of both the suspicious areas in the right breast as well as one of the suspicious lymph nodes was performed 03/27/2013 and showed ([id number]) the larger breast mass to consist of an invasive ductal carcinoma with lobular features, grade 2, estrogen and progesterone receptor both 100% positive, with an MIB-1 of 15%, and no HER-2 amplification. 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 The sampled right axillary lymph node was also positive.  Bilateral breast MRI 04/01/2013 showed the larger right breast mass to measure 5.4 cm, with a satellite nodule immediately inferior to it measuring 8 mm. 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 There were multiple enlarged right axillary lymph nodes, the largest measuring 2.8 cm. In addition, in the left breast there were 2 foci on non-masslike enhancement consistent with DCIS  measuring 1.3 cm and 2.2 cm. These weren't different quadrants.  The patient's subsequent history is as noted below  INTERVAL HISTORY: [name] [name] [name] returns today accompanied by her daughter [name] [name] [name] for followup of her right breast carcinoma. She is status post bilateral mastectomies on 04/07/2013 under the care of Dr. Ezzard [name]. She's recently diagnosed with a cellulitis, and is completing a course of doxycycline with no complications. Dr. Ezzard [name] has released her to initiate chemotherapy, and she scheduled for day 1 cycle 1 of 6 planned q. three-week doses of adjuvant docetaxel/doxorubicin/cyclophosphamide tomorrow, June 25.  [name] [name] [name] is still extremely anxious about chemotherapy tomorrow and tells me she is "scared". I spent a lot of time counseling her today regarding her upcoming treatment plan, and this is all detailed below. She does have old her antinausea medications and has all of her instructions in writing.  REVIEW OF SYSTEMS: Other than anxiety, wheezes biggest complaint is continued fatigue. She has had no fevers or chills. [name] [name] [name] denies any rashes and has had no abnormal bruising or bleeding. Her appetite is good and she currently denies any nausea or recent change in bowel or bladder habits. She does take Colace on a regular basis to keep her bowels moving. She has shortness of breath with exertion. She's had no new cough, or phlegm production. She denies orthopnea. She's had no chest [name] or palpitations. She denies any abnormal headaches or dizziness. She does tend to have general aches and pains, and has a history of arthritic [name] in her knees. She's had no peripheral swelling, and at baseline, denies  any signs of peripheral neuropathy.  A detailed review of systems is otherwise stable and noncontributory.   PAST MEDICAL HISTORY: Past Medical History  Diagnosis Date  . Hypertension   . Anxiety   . Depression   . Headache(784.0)   . Sleep apnea     cpcp  . Shortness of breath   . Breast  cancer     PAST SURGICAL HISTORY: Past Surgical History  Procedure Laterality Date  . Mastectomy, radical Right 04/08/2013  . Simple mastectomy with axillary sentinel node biopsy Left 04/08/2013  . Mastectomy modified radical Right 04/07/2013    Procedure: RIGHT MASTECTOMY MODIFIED RADICAL;  Surgeon: Kandis Cocking, MD;  Location: [facility] OR;  Service: General;  Laterality: Right;  . Simple mastectomy with axillary sentinel node biopsy Left 04/07/2013    Procedure:  LEFT SMPLE MASTECTOMY WITH AXILLARY SENTINEL NODE BIOPSY;  Surgeon: Kandis Cocking, MD;  Location: [facility] OR;  Service: General;  Laterality: Left;  . Portacath placement Left 04/07/2013    Procedure: INSERTION PORT-A-CATH;  Surgeon: Kandis Cocking, MD;  Location: [facility] OR;  Service: General;  Laterality: Left;    FAMILY HISTORY Family History  Problem Relation Age of Onset  . Breast cancer Mother 39  . Breast cancer Maternal Grandmother 11  . Prostate cancer Paternal Grandfather 38   the patient's father died at age 72 in an airplane crash (he was a Transport planner unsupervised). The patient's mother is alive at age 5. Her name is [name] [name] [name] and she works for [employer]. [name] [name] [name] has a history of breast cancer diagnosed at age 74. She had one sister who is alive and well. [name] [name] [name]'s mother, the patient's grandmother, was diagnosed with breast cancer at age 79. She had 2 sisters, neither with breast cancer. [name] [name] [name] herself had one brother, no sisters. There is no other history of breast or ovary cancer in the family.  GYNECOLOGIC HISTORY:  Menarche age 52, first live birth age 30. The patient was taking birth control pills until September. She stopped having periods at that time, but had one in late March.  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 Her husband [name] [name] [name] (goes by "[name] [name] [name]") is disabled secondary to diabetes. They are separated, but not divorced, and remain on good terms. [name] [name] [name] lives with her mother  [name] [name] [name] and 8 pools. The patient's daughter [name] [name] [name] also lives with the patient.    ADVANCED DIRECTIVES: In place. The patient has named her daughter [name] [name] [name] as her healthcare power of attorney, bypassing her husband.  HEALTH MAINTENANCE: History  Substance Use Topics  . Smoking status: Never Smoker   . Smokeless tobacco: Never Used  . Alcohol Use: No     Colonoscopy: Never  PAP: Possibly 2006  Bone density: Possibly 2004  Lipid panel:  Allergies  Allergen Reactions  . Vicodin (Hydrocodone-Acetaminophen) Anaphylaxis  . Codeine Nausea And Vomiting  . Penicillins Rash  . Percocet (Oxycodone-Acetaminophen) Itching  . Septra (Bactrim) Other (See Comments)    Heart races    Current Outpatient Prescriptions  Medication Sig Dispense Refill  . acetaminophen (TYLENOL) 500 MG tablet Take 1,000 mg by mouth 2 (two) times daily as needed. For [name]      . ALPRAZolam (XANAX) 0.25 MG tablet Take 1 tablet by mouth Three times a day.      Marland Kitchen dexamethasone (DECADRON) 4 MG tablet 2 tabs by mouth twice daily with food on day before chemo and 3 days after chemo  30 tablet  2  . docusate sodium 100 MG CAPS Take 200 mg by mouth 2 (two) times daily.  10 capsule  0  . doxycycline (VIBRA-TABS) 100 MG tablet Take 1 tablet (100 mg total) by mouth 2 (two) times daily.  14 tablet  2  . escitalopram (LEXAPRO) 10 MG tablet Take 1 tablet by mouth daily.      Marland Kitchen lidocaine-prilocaine (EMLA) cream Apply topically as needed. 1-2 hours before each chemo  30 g  1  . loratadine (CLARITIN) 10 MG tablet 1 tablet by mouth once daily for 5 days after each dose of chemo  30 tablet  1  . meloxicam (MOBIC) 15 MG tablet Take 15 mg by mouth daily.      . ondansetron (ZOFRAN) 4 MG tablet Take 4 mg by mouth every 8 (eight) hours as needed for nausea.      . prochlorperazine (COMPAZINE) 10 MG tablet 1 tab by mouth with meals and bedtime x 3 days after chemo, then every 6 hrs as needed for nausea  30 tablet  2  .  tobramycin-dexamethasone (TOBRADEX) ophthalmic solution Place 1 drop into both eyes 2 (two) times daily. For 7 days after each dose of chemo  5 mL  1  . traMADol (ULTRAM) 50 MG tablet Take 1 tablet (50 mg total) by mouth every 8 (eight) hours as needed for [name].  30 tablet  0  . triamterene-hydrochlorothiazide (MAXZIDE-25) 37.5-25 MG per tablet Take 1 tablet by mouth daily.       No current facility-administered medications for this visit.    OBJECTIVE: Middle-aged white woman who appears anxious but is in no acute distress Filed Vitals:   05/20/13 1426  BP: 142/84  Pulse: 82  Temp: 98.9 F (37.2 C)  Resp: 20     Body mass index is 38.55 kg/(m^2).    ECOG FS: 1 Filed Weights   05/20/13 1426  Weight: 210 lb 12.8 oz (95.618 kg)   Physical Exam: HEENT:  Sclerae anicteric.  Oropharynx clear.  Nodes:  No cervical or supraclavicular lymphadenopathy palpated.  Breast Exam: Patient status post bilateral mastectomies.  Incisions are healing well, with only mild erythema and no evidence of current infection. Port is intact in the left upper chest wall Lungs:  Clear to auscultation bilaterally.  No wheezes or rhonchi Heart:  Regular rate and rhythm.   Abdomen:  Soft, obese, nontender.  Positive bowel sounds.  Musculoskeletal:  No focal spinal tenderness to palpation.  Extremities:  No peripheral edema   Neuro:  Nonfocal. Well oriented with anxious affect      LAB RESULTS:  Lab Results  Component Value Date   WBC 12.5* 05/20/2013   NEUTROABS 8.4* 05/20/2013   HGB 11.4* 05/20/2013   HCT 34.5* 05/20/2013   MCV 88.8 05/20/2013   PLT 302 05/20/2013      Chemistry      Component Value Date/Time   NA 142 05/20/2013 1404   NA 141 04/19/2013 0815   K 3.7 05/20/2013 1404   K 3.8 04/19/2013 0815   CL 103 05/20/2013 1404   CL 106 04/19/2013 0815   CO2 29 05/20/2013 1404   CO2 24 04/19/2013 0815   BUN 9.9 05/20/2013 1404   BUN 14 04/19/2013 0815   CREATININE 1.0 05/20/2013 1404   CREATININE 0.79  04/19/2013 0815      Component Value Date/Time   CALCIUM 10.1 05/20/2013 1404   CALCIUM 8.8 04/19/2013 0815   ALKPHOS 71 05/20/2013 1404  ALKPHOS 63 04/17/2013 0741   AST 9 05/20/2013 1404   AST 23 04/17/2013 0741   ALT 8 05/20/2013 1404   ALT 26 04/17/2013 0741   BILITOT 0.23 05/20/2013 1404   BILITOT 0.2* 04/17/2013 0741       STUDIES:  Echocardiogram on 05/01/2013 showed an ejection fraction of 55-60%.    ASSESSMENT: 52 y.o. [place] woman status post right breast and right axillary lymph node biopsy 03/27/2013 for a clinical T3 N1, or stage III invasive ductal carcinoma, with lobular features, estrogen and progesterone receptor both 100% positive, with an MIB-1 of 15% and no HER-2 amplification  (1) there was a second, satellite lesion in the right breast measuring 8 mm, and an ill-defined area in the same breast biopsy proven to be DCIS. There were also 2 suspicious areas in the left breast noted by MRI.  (2) s/p bilateral mastectomies with Left sentinel node sampling and Right axillary node dissection, showing  (a) on the left, no malignancy, negative sentinel node  (b) on the right, invasive lobular adenocarcinoma pT2 pN2, stage IIIA, grade 2, repeat HER-2 again not amplified  (3) genetics testing pending  (4)  due to initiate adjuvant chemotherapy, consisting of 6 cycles of docetaxel/doxorubicin/cyclophosphamide given on a Q. three-week basis, first dose scheduled for 05/21/2013.  Neulasta to be given on day 2 for granulocyte support.    (5)  Adjuvant chemotherapy will be followed by postmastectomy radiation therapy, then antiestrogen.  PLAN:  Over half of our 40 minute appointment today was spent once again reviewing pieces antinausea regimen, reviewing her treatment plan, and coordinating care. She scheduled for day 1 cycle 1 of 6 planned adjuvant doses of chemotherapy tomorrow, June 25. We went through all of her medications again, and she and her daughter [name] [name] [name] voiced  understanding on how to take these appropriately following chemotherapy. They have all of this information in writing.   I will see Cleotilde again next week for assessment of chemotoxicity on July 2. She'll continue to be followed by Dr. Ezzard [name], and will complete out her course of doxycycline as previously prescribed by his office for cellulitis. If her mastectomy incisions become more erythematous, painful, or show additional signs of drainage she will contact this office. She also knows to contact us with any fevers or chills.  Misty Stanley and [place] both voice understanding and agreement with this plan today. She will call with any problems.    Zollie Scale, PA-C   05/20/2013

## 2013-05-20 NOTE — Telephone Encounter (Signed)
Per staff phone call I have adjusted 8/6 appt.  JMW

## 2013-05-21 ENCOUNTER — Other Ambulatory Visit: Payer: Self-pay | Admitting: *Deleted

## 2013-05-21 ENCOUNTER — Other Ambulatory Visit: Payer: Self-pay | Admitting: Oncology

## 2013-05-21 ENCOUNTER — Ambulatory Visit (HOSPITAL_COMMUNITY)
Admission: RE | Admit: 2013-05-21 | Discharge: 2013-05-21 | Disposition: A | Discharge status: Home / Self Care | Payer: Medicaid Other | Source: Ambulatory Visit | Attending: Physician Assistant | Admitting: Physician Assistant

## 2013-05-21 ENCOUNTER — Ambulatory Visit (HOSPITAL_BASED_OUTPATIENT_CLINIC_OR_DEPARTMENT_OTHER): Payer: Medicaid Other

## 2013-05-21 VITALS — BP 131/78 | HR 64 | Temp 99.4°F | Resp 18

## 2013-05-21 DIAGNOSIS — C50411 Malignant neoplasm of upper-outer quadrant of right female breast: Secondary | ICD-10-CM

## 2013-05-21 DIAGNOSIS — Z5111 Encounter for antineoplastic chemotherapy: Secondary | ICD-10-CM

## 2013-05-21 DIAGNOSIS — C50419 Malignant neoplasm of upper-outer quadrant of unspecified female breast: Secondary | ICD-10-CM | POA: Insufficient documentation

## 2013-05-21 DIAGNOSIS — Z90710 Acquired absence of both cervix and uterus: Secondary | ICD-10-CM | POA: Insufficient documentation

## 2013-05-21 MED ORDER — PALONOSETRON HCL INJECTION 0.25 MG/5ML
0.2500 mg | Freq: Once | INTRAVENOUS | Status: AC
  Administered 2013-05-21: 0.25 mg via INTRAVENOUS

## 2013-05-21 MED ORDER — SODIUM CHLORIDE 0.9 % IV SOLN
150.0000 mg | Freq: Once | INTRAVENOUS | Status: AC
  Administered 2013-05-21: 150 mg via INTRAVENOUS
  Filled 2013-05-21: qty 5

## 2013-05-21 MED ORDER — DEXAMETHASONE SODIUM PHOSPHATE 20 MG/5ML IJ SOLN
12.0000 mg | Freq: Once | INTRAMUSCULAR | Status: AC
  Administered 2013-05-21: 12 mg via INTRAVENOUS

## 2013-05-21 MED ORDER — SODIUM CHLORIDE 0.9 % IV SOLN
75.0000 mg/m2 | Freq: Once | INTRAVENOUS | Status: AC
  Administered 2013-05-21: 150 mg via INTRAVENOUS
  Filled 2013-05-21: qty 15

## 2013-05-21 MED ORDER — SODIUM CHLORIDE 0.9 % IV SOLN
500.0000 mg/m2 | Freq: Once | INTRAVENOUS | Status: AC
  Administered 2013-05-21: 1020 mg via INTRAVENOUS
  Filled 2013-05-21: qty 51

## 2013-05-21 MED ORDER — DOXORUBICIN HCL CHEMO IV INJECTION 2 MG/ML
50.0000 mg/m2 | Freq: Once | INTRAVENOUS | Status: AC
  Administered 2013-05-21: 102 mg via INTRAVENOUS
  Filled 2013-05-21: qty 51

## 2013-05-21 NOTE — Patient Instructions (Addendum)
[facility] Discharge Instructions for Patients Receiving Chemotherapy  Today you received the following chemotherapy agents: Doxorubicin, Cyclophosphamide, and Docetaxel. To help prevent nausea and vomiting after your treatment, we encourage you to take your nausea medication    If you develop nausea and vomiting that is not controlled by your nausea medication, call the clinic.   BELOW ARE SYMPTOMS THAT SHOULD BE REPORTED IMMEDIATELY:  *FEVER GREATER THAN 100.5 F  *CHILLS WITH OR WITHOUT FEVER  NAUSEA AND VOMITING THAT IS NOT CONTROLLED WITH YOUR NAUSEA MEDICATION  *UNUSUAL SHORTNESS OF BREATH  *UNUSUAL BRUISING OR BLEEDING  TENDERNESS IN MOUTH AND THROAT WITH OR WITHOUT PRESENCE OF ULCERS  *URINARY PROBLEMS  *BOWEL PROBLEMS  UNUSUAL RASH Items with * indicate a potential emergency and should be followed up as soon as possible.  Feel free to call the clinic you have any questions or concerns. The clinic phone number is [phone].

## 2013-05-21 NOTE — Progress Notes (Signed)
Orders entered for walk-in CXR today after chemo to follow up tip placement of PAC. Notified infusion nurse, Ocie Bob, RN to send patient after treatment today.

## 2013-05-22 ENCOUNTER — Telehealth: Payer: Self-pay | Admitting: *Deleted

## 2013-05-22 ENCOUNTER — Other Ambulatory Visit: Payer: Self-pay | Admitting: Physician Assistant

## 2013-05-22 ENCOUNTER — Ambulatory Visit (HOSPITAL_BASED_OUTPATIENT_CLINIC_OR_DEPARTMENT_OTHER): Payer: Medicaid Other

## 2013-05-22 VITALS — BP 151/63 | HR 64 | Temp 98.1°F

## 2013-05-22 DIAGNOSIS — C50419 Malignant neoplasm of upper-outer quadrant of unspecified female breast: Secondary | ICD-10-CM

## 2013-05-22 DIAGNOSIS — C773 Secondary and unspecified malignant neoplasm of axilla and upper limb lymph nodes: Secondary | ICD-10-CM

## 2013-05-22 DIAGNOSIS — C50411 Malignant neoplasm of upper-outer quadrant of right female breast: Secondary | ICD-10-CM

## 2013-05-22 MED ORDER — PEGFILGRASTIM INJECTION 6 MG/0.6ML
6.0000 mg | Freq: Once | SUBCUTANEOUS | Status: AC
  Administered 2013-05-22: 6 mg via SUBCUTANEOUS
  Filled 2013-05-22: qty 0.6

## 2013-05-22 NOTE — Telephone Encounter (Signed)
Pt here for Neulasta injection.  Pt received Taxotere, Adriamycin and Cytoxan on 05/21/13.  She denies any pain or nausea.  Urinating with no difficulties.  Bowels are unremarkable.  Taking fluids and eating without difficulty.  Pt. Instructed to call [facility] with any difficulties or concerns.

## 2013-05-22 NOTE — Patient Instructions (Addendum)

## 2013-05-22 NOTE — Telephone Encounter (Signed)
This RN was notified by APP of concern per review of port a cath tip placement. Repeat Xray obtained today showing tip in superior right atrium " unchanged from prior CT ".  Called placed to Chrystie Nose RN to verify need for revision per protocol for referral back to surgeon.

## 2013-05-23 ENCOUNTER — Encounter: Payer: Self-pay | Admitting: Oncology

## 2013-05-28 ENCOUNTER — Ambulatory Visit (HOSPITAL_BASED_OUTPATIENT_CLINIC_OR_DEPARTMENT_OTHER): Payer: Medicaid Other | Admitting: Physician Assistant

## 2013-05-28 ENCOUNTER — Other Ambulatory Visit (HOSPITAL_BASED_OUTPATIENT_CLINIC_OR_DEPARTMENT_OTHER): Payer: Medicaid Other | Admitting: Lab

## 2013-05-28 ENCOUNTER — Encounter: Payer: Self-pay | Admitting: Physician Assistant

## 2013-05-28 ENCOUNTER — Encounter: Payer: Self-pay | Admitting: *Deleted

## 2013-05-28 VITALS — BP 147/102 | HR 100 | Temp 98.5°F | Resp 20 | Ht 62.0 in | Wt 196.4 lb

## 2013-05-28 DIAGNOSIS — C50411 Malignant neoplasm of upper-outer quadrant of right female breast: Secondary | ICD-10-CM

## 2013-05-28 DIAGNOSIS — C50419 Malignant neoplasm of upper-outer quadrant of unspecified female breast: Secondary | ICD-10-CM

## 2013-05-28 DIAGNOSIS — D702 Other drug-induced agranulocytosis: Secondary | ICD-10-CM

## 2013-05-28 DIAGNOSIS — K602 Anal fissure, unspecified: Secondary | ICD-10-CM | POA: Insufficient documentation

## 2013-05-28 DIAGNOSIS — K6289 Other specified diseases of anus and rectum: Secondary | ICD-10-CM

## 2013-05-28 LAB — CBC WITH DIFFERENTIAL/PLATELET
Basophils Absolute: 0 10*3/uL (ref 0.0–0.1)
EOS%: 3.6 % (ref 0.0–7.0)
HCT: 38.5 % (ref 34.8–46.6)
HGB: 13.2 g/dL (ref 11.6–15.9)
LYMPH%: 75.6 % — ABNORMAL HIGH (ref 14.0–49.7)
MCH: 29.5 pg (ref 25.1–34.0)
MCV: 85.9 fL (ref 79.5–101.0)
MONO%: 8.9 % (ref 0.0–14.0)
NEUT%: 11 % — ABNORMAL LOW (ref 38.4–76.8)
Platelets: 270 10*3/uL (ref 145–400)
lymph#: 1.7 10*3/uL (ref 0.9–3.3)

## 2013-05-28 LAB — COMPREHENSIVE METABOLIC PANEL (CC13)
Albumin: 4 g/dL (ref 3.5–5.0)
Alkaline Phosphatase: 69 U/L (ref 40–150)
BUN: 15 mg/dL (ref 7.0–26.0)
Creatinine: 1 mg/dL (ref 0.6–1.1)
Glucose: 102 mg/dl (ref 70–140)
Potassium: 3.7 mEq/L (ref 3.5–5.1)

## 2013-05-28 MED ORDER — CIPROFLOXACIN HCL 500 MG PO TABS: 500.0000 mg | ORAL_TABLET | Freq: Two times a day (BID) | ORAL | Status: DC

## 2013-05-28 MED ORDER — ACETAMINOPHEN 500 MG PO TABS: 1000.0000 mg | ORAL_TABLET | Freq: Four times a day (QID) | ORAL | Status: DC | PRN

## 2013-05-28 MED ORDER — DILTIAZEM GEL 2 %: 1.0000 "application " | Freq: Two times a day (BID) | CUTANEOUS | Status: AC

## 2013-05-28 NOTE — Progress Notes (Signed)
ID: [name] OB: [birth year]  MR#: [id number]  WUJ#:[id number]  PCP: Raliegh Ip, MD GYN:   SU: Ovidio Kin OTHER MD: Chipper Herb   HISTORY OF PRESENT ILLNESS: Dionna tells me she has not been able to have mammography for several years because of cost issues. More recently, she had noted a mass in her right breast but thought this might be MRSA. The urinary tract infection took her to her physician's office, and she brought the breast mass to the attention of [name]. She set up the patient for bilateral diagnostic mammography and right breast ultrasound at the [facility] 03/27/2013, which showed a 4 cm irregular mass in the upper outer right breast, with a 9 mm cluster of calcifications in the posterior lower right breast and a few mildly enlarged level I right axillary lymph nodes. The breast mass was palpable and ultrasound showed it to measure 4.1 cm, with some of the level I right axillary lymph nodes noted to have lost their fatty hilum.  Biopsy of both the suspicious areas in the right breast as well as one of the suspicious lymph nodes was performed 03/27/2013 and showed ([id number]) the larger breast mass to consist of an invasive ductal carcinoma with lobular features, grade 2, estrogen and progesterone receptor both 100% positive, with an MIB-1 of 15%, and no HER-2 amplification. 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 The sampled right axillary lymph node was also positive.  Bilateral breast MRI 04/01/2013 showed the larger right breast mass to measure 5.4 cm, with a satellite nodule immediately inferior to it measuring 8 mm. 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 There were multiple enlarged right axillary lymph nodes, the largest measuring 2.8 cm. In addition, in the left breast there were 2 foci on non-masslike enhancement consistent with DCIS  measuring 1.3 cm and 2.2 cm. These weren't different quadrants.  The patient's subsequent history is as noted below  INTERVAL HISTORY: Nikkol returns today accompanied by her daughter [name] for followup of her right breast carcinoma. She is currently day 8 cycle 1 of 6 planned q. three-week doses of adjuvant docetaxel/doxorubicin/cyclophosphamide.  She receives Neulasta on day 2 for granulocyte support.  With regards to the chemotherapy, Oswin actually tolerated things quite well. She took her antinausea medications appropriately, and has had very little nausea and no emesis. She has had some mild constipation and is taking Colace, 2 tablets daily. Marolyn's biggest complaint today is rectal pain associated with a known rectal fissure. She does have some rectal bleeding with bowel movements, specifically blood on the tissue, but no blood in the toilet water.  This was evaluated recently by Dr. Ezzard Standing who prescribed diltiazem cream. Unfortunately, there was a problem at [facility] and she has been unable to pick that up.   REVIEW OF SYSTEMS: Colene has had no fevers or chills. She denies any skin changes. Other than the rectal bleeding noted above, she's had no additional signs of abnormal bleeding. Her appetite is fair. (She does tell me they're having a lot of financial problems and "haven't had food".) She's had no new cough, phlegm production or chest pain, but continues to have shortness of breath with exertion which is stable. She denies any abnormal headaches or dizziness. She continues to be very tired and fatigued, and continues to have a lot of anxiety. She denies any unusual myalgias, arthralgias,  bony pain, or signs of peripheral neuropathy.   A detailed review of systems is otherwise stable and noncontributory.   PAST MEDICAL HISTORY: Past Medical History  Diagnosis Date  . Hypertension   . Anxiety   . Depression   . Headache(784.0)   . Sleep apnea     cpcp  . Shortness of breath    . Breast cancer     PAST SURGICAL HISTORY: Past Surgical History  Procedure Laterality Date  . Mastectomy, radical Right 04/08/2013  . Simple mastectomy with axillary sentinel node biopsy Left 04/08/2013  . Mastectomy modified radical Right 04/07/2013    Procedure: RIGHT MASTECTOMY MODIFIED RADICAL;  Surgeon: Kandis Cocking, MD;  Location: [facility] OR;  Service: General;  Laterality: Right;  . Simple mastectomy with axillary sentinel node biopsy Left 04/07/2013    Procedure:  LEFT SMPLE MASTECTOMY WITH AXILLARY SENTINEL NODE BIOPSY;  Surgeon: Kandis Cocking, MD;  Location: [facility] OR;  Service: General;  Laterality: Left;  . Portacath placement Left 04/07/2013    Procedure: INSERTION PORT-A-CATH;  Surgeon: Kandis Cocking, MD;  Location: [facility] OR;  Service: General;  Laterality: Left;    FAMILY HISTORY Family History  Problem Relation Age of Onset  . Breast cancer Mother 79  . Breast cancer Maternal Grandmother 10  . Prostate cancer Paternal Grandfather 47   the patient's father died at age 84 in an airplane crash (he was a Transport planner unsupervised). The patient's mother is alive at age 72. Her name is [name] and she works for [employer]. [name] has a history of breast cancer diagnosed at age 42. She had one sister who is alive and well. [name]'s mother, the patient's grandmother, was diagnosed with breast cancer at age 37. She had 2 sisters, neither with breast cancer. Kirra herself had one brother, no sisters. There is no other history of breast or ovary cancer in the family.  GYNECOLOGIC HISTORY:  Menarche age 52, first live birth age 52. The patient was taking birth control pills until September. She stopped having periods at that time, but had one in late March.  SOCIAL HISTORY:  Jezebelle worked as a Sales executive for 30 years. Her husband Gizella Belleville (goes by "[name]") is disabled secondary to diabetes. They are separated, but not divorced, and remain on good terms. Torin lives with  her mother [name] and 8 pools. The patient's daughter [name] "[name]" Lacaze also lives with the patient.    ADVANCED DIRECTIVES: In place. The patient has named her daughter [name] as her healthcare power of attorney, bypassing her husband.  HEALTH MAINTENANCE: History  Substance Use Topics  . Smoking status: Never Smoker   . Smokeless tobacco: Never Used  . Alcohol Use: No     Colonoscopy: Never  PAP: Possibly 2006  Bone density: Possibly 2004  Lipid panel:  Allergies  Allergen Reactions  . Vicodin (Hydrocodone-Acetaminophen) Anaphylaxis  . Codeine Nausea And Vomiting  . Penicillins Rash  . Percocet (Oxycodone-Acetaminophen) Itching  . Septra (Bactrim) Other (See Comments)    Heart races    Current Outpatient Prescriptions  Medication Sig Dispense Refill  . acetaminophen (TYLENOL) 500 MG tablet Take 2 tablets (1,000 mg total) by mouth every 6 (six) hours as needed (Maximum 8 tabs in 24 hours). For pain  50 tablet  1  . ALPRAZolam (XANAX) 0.25 MG tablet Take 1 tablet by mouth Three times a day.      Marland Kitchen dexamethasone (DECADRON) 4 MG tablet 2 tabs by mouth  twice daily with food on day before chemo and 3 days after chemo  30 tablet  2  . docusate sodium 100 MG CAPS Take 200 mg by mouth 2 (two) times daily.  10 capsule  0  . doxycycline (VIBRA-TABS) 100 MG tablet Take 1 tablet (100 mg total) by mouth 2 (two) times daily.  14 tablet  2  . escitalopram (LEXAPRO) 10 MG tablet Take 1 tablet by mouth daily.      Marland Kitchen lidocaine-prilocaine (EMLA) cream Apply topically as needed. 1-2 hours before each chemo  30 g  1  . loratadine (CLARITIN) 10 MG tablet 1 tablet by mouth once daily for 5 days after each dose of chemo  30 tablet  1  . meloxicam (MOBIC) 15 MG tablet Take 15 mg by mouth daily.      . ondansetron (ZOFRAN) 4 MG tablet Take 4 mg by mouth every 8 (eight) hours as needed for nausea.      . prochlorperazine (COMPAZINE) 10 MG tablet 1 tab by mouth with meals and bedtime x 3 days  after chemo, then every 6 hrs as needed for nausea  30 tablet  2  . tobramycin-dexamethasone (TOBRADEX) ophthalmic solution Place 1 drop into both eyes 2 (two) times daily. For 7 days after each dose of chemo  5 mL  1  . traMADol (ULTRAM) 50 MG tablet Take 1 tablet (50 mg total) by mouth every 8 (eight) hours as needed for pain.  30 tablet  0  . triamterene-hydrochlorothiazide (MAXZIDE-25) 37.5-25 MG per tablet Take 1 tablet by mouth daily.      . ciprofloxacin (CIPRO) 500 MG tablet Take 1 tablet (500 mg total) by mouth 2 (two) times daily.  14 tablet  3  . diltiazem 2 % GEL Apply 1 application topically 2 (two) times daily.  30 g  1   No current facility-administered medications for this visit.    OBJECTIVE: Middle-aged white woman who appears anxious and very uncomfortable  Filed Vitals:   05/28/13 1153  BP: 147/102  Pulse: 100  Temp: 98.5 F (36.9 C)  Resp: 20     Body mass index is 35.91 kg/(m^2).    ECOG FS: 1 Filed Weights   05/28/13 1153  Weight: 196 lb 6.4 oz (89.086 kg)   Physical Exam: HEENT:  Sclerae anicteric.  Oropharynx clear.  Nodes:  No cervical or supraclavicular lymphadenopathy palpated.  Breast Exam: Deferred.  No axillary adenopathy. Port is intact in the left upper chest wall Lungs:  Clear to auscultation bilaterally.  No wheezes or rhonchi Heart:  Regular rate and rhythm.   Abdomen:  Soft, obese, nontender.  Positive bowel sounds.  Musculoskeletal:  No focal spinal tenderness to palpation.  Extremities:  No peripheral edema   Neuro:  Nonfocal. Well oriented with anxious affect Rectal exam reveals an anterior hemorrhoid in addition to a posterior rectal fissure. No drainage or bleeding noted on exam.    LAB RESULTS:  Lab Results  Component Value Date   WBC 2.3* 05/28/2013   NEUTROABS 0.3* 05/28/2013   HGB 13.2 05/28/2013   HCT 38.5 05/28/2013   MCV 85.9 05/28/2013   PLT 270 05/28/2013      Chemistry      Component Value Date/Time   NA 136 05/28/2013 1139    NA 141 04/19/2013 0815   K 3.7 05/28/2013 1139   K 3.8 04/19/2013 0815   CL 103 05/20/2013 1404   CL 106 04/19/2013 0815   CO2 26 05/28/2013  1139   CO2 24 04/19/2013 0815   BUN 15.0 05/28/2013 1139   BUN 14 04/19/2013 0815   CREATININE 1.0 05/28/2013 1139   CREATININE 0.79 04/19/2013 0815      Component Value Date/Time   CALCIUM 10.1 05/28/2013 1139   CALCIUM 8.8 04/19/2013 0815   ALKPHOS 69 05/28/2013 1139   ALKPHOS 63 04/17/2013 0741   AST 26 05/28/2013 1139   AST 23 04/17/2013 0741   ALT 59* 05/28/2013 1139   ALT 26 04/17/2013 0741   BILITOT 0.91 05/28/2013 1139   BILITOT 0.2* 04/17/2013 0741       STUDIES:  Echocardiogram on 05/01/2013 showed an ejection fraction of 55-60%.    ASSESSMENT: 52 y.o. 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 There were also 2 suspicious areas in the left breast noted by MRI.  (2) s/p bilateral mastectomies with Left sentinel node sampling and Right axillary node dissection, showing  (a) on the left, no malignancy, negative sentinel node  (b) on the right, invasive lobular adenocarcinoma pT2 pN2, stage IIIA, grade 2, repeat HER-2 again not amplified  (3) genetics testing pending  (4)  due to initiate adjuvant chemotherapy, consisting of 6 cycles of docetaxel/doxorubicin/cyclophosphamide given on a Q. three-week basis, first dose scheduled for 05/21/2013.  Neulasta to be given on day 2 for granulocyte support.    (5)  Adjuvant chemotherapy will be followed by postmastectomy radiation therapy, then antiestrogen.  (6)  Rectal fissure  PLAN:  Over half of our 60 minute appointment today was  spent in addressing Shalina's concerns, counseling her regarding her treatment options, and coordinating care.  With regards to the chemotherapy itself, she actually tolerated it very well, and will return in 2 weeks as scheduled on July 16 in anticipation of day 1 cycle 2 of docetaxel/doxorubicin/cyclophosphamide.  With regards to her rectal fissure, she has a sitz to use at home, and I recommended that she try this several times daily with warm water. She had a problem sitting up her diltiazem cream previously prescribed by Dr. Ezzard Standing, and I have given her a new prescription for that today. She is very much against taking any prescription pain medications because of the risk of constipation, so I have encouraged her to take Tylenol as needed for the pain, a maximum of 8 tablets daily. To prevent constipation, she will actually increase her stool softeners from 2 tablets daily to 4 tablets daily, 2 in the morning and 2 at night.  I also encouraged her to increase her fluid intake as well as her dietary fiber.  We also reviewed neutropenic precautions today. She was given a  thermometer and understands to contact us with temperatures of 100 or above. I have also started her on Cipro prophylactically, 500 mg by mouth twice a day for the next 7 days.   Finally, with regards to Cale's financial situation, she spoke to our Child psychotherapist, [employer], today, and will also meet with Jerilynn Som to discuss financial support through [employer].     this plan was reviewed thoroughly with Areebah and her daughter, and they were given all of this information  in writing today.  Misty Stanley and [place] both voice understanding and agreement with this plan today. She will call with any problems prior to her next scheduled appointment.   Delton Stelle, PA-C   05/28/2013

## 2013-05-28 NOTE — Progress Notes (Signed)
Clinical Social Work was referred by [facility] for support resources/financial assistance.  CSW met with patient and patient's daughter in breast center.  Ms. Kowal states she is currently experiencing a lot of rectal pain, but has no funds to pay for medications.  She states she receives food stamps, but is otherwise struggling to meet financial needs due to no income.  Patient and CSW agree that patient's current priority is filling medications.  CSW accompanied patient to financial advocate for financial assistance.  CSW and patient plan to meet on 06/04/13 at next lab appointment.  Kathrin Penner, MSW, LCSW Clinical Social Worker [facility] [phone]

## 2013-05-29 ENCOUNTER — Encounter: Payer: Self-pay | Admitting: Oncology

## 2013-05-29 NOTE — Progress Notes (Signed)
The patient recd meds from [facility], because [facility] did not have. I sent email to Wynona Canes to advise-- [facility] 31.60  05/28/13.

## 2013-06-04 ENCOUNTER — Other Ambulatory Visit (HOSPITAL_BASED_OUTPATIENT_CLINIC_OR_DEPARTMENT_OTHER): Payer: Medicaid Other

## 2013-06-04 DIAGNOSIS — C50419 Malignant neoplasm of upper-outer quadrant of unspecified female breast: Secondary | ICD-10-CM

## 2013-06-04 DIAGNOSIS — C50411 Malignant neoplasm of upper-outer quadrant of right female breast: Secondary | ICD-10-CM

## 2013-06-04 LAB — CBC WITH DIFFERENTIAL/PLATELET
EOS%: 0.7 % (ref 0.0–7.0)
Eosinophils Absolute: 0.1 10*3/uL (ref 0.0–0.5)
LYMPH%: 22.9 % (ref 14.0–49.7)
MCH: 29.9 pg (ref 25.1–34.0)
MCV: 87.6 fL (ref 79.5–101.0)
MONO%: 4.2 % (ref 0.0–14.0)
Platelets: 222 10*3/uL (ref 145–400)
RBC: 3.64 10*6/uL — ABNORMAL LOW (ref 3.70–5.45)
RDW: 14.5 % (ref 11.2–14.5)

## 2013-06-10 ENCOUNTER — Encounter: Payer: Self-pay | Admitting: *Deleted

## 2013-06-10 NOTE — Progress Notes (Signed)
Mailed after appt letter to pt. 

## 2013-06-11 ENCOUNTER — Ambulatory Visit (HOSPITAL_BASED_OUTPATIENT_CLINIC_OR_DEPARTMENT_OTHER): Payer: Medicaid Other

## 2013-06-11 ENCOUNTER — Encounter: Payer: Self-pay | Admitting: Physician Assistant

## 2013-06-11 ENCOUNTER — Telehealth: Payer: Self-pay | Admitting: *Deleted

## 2013-06-11 ENCOUNTER — Ambulatory Visit (HOSPITAL_BASED_OUTPATIENT_CLINIC_OR_DEPARTMENT_OTHER): Payer: Medicaid Other | Admitting: Physician Assistant

## 2013-06-11 ENCOUNTER — Other Ambulatory Visit (HOSPITAL_BASED_OUTPATIENT_CLINIC_OR_DEPARTMENT_OTHER): Payer: Medicaid Other | Admitting: Lab

## 2013-06-11 VITALS — BP 145/84 | HR 73 | Temp 98.0°F | Resp 18 | Ht 62.0 in | Wt 221.1 lb

## 2013-06-11 DIAGNOSIS — C50411 Malignant neoplasm of upper-outer quadrant of right female breast: Secondary | ICD-10-CM

## 2013-06-11 DIAGNOSIS — C50419 Malignant neoplasm of upper-outer quadrant of unspecified female breast: Secondary | ICD-10-CM

## 2013-06-11 DIAGNOSIS — D72829 Elevated white blood cell count, unspecified: Secondary | ICD-10-CM

## 2013-06-11 DIAGNOSIS — Z170 Estrogen receptor positive status [ER+]: Secondary | ICD-10-CM

## 2013-06-11 DIAGNOSIS — Z452 Encounter for adjustment and management of vascular access device: Secondary | ICD-10-CM

## 2013-06-11 DIAGNOSIS — Z5111 Encounter for antineoplastic chemotherapy: Secondary | ICD-10-CM

## 2013-06-11 DIAGNOSIS — C773 Secondary and unspecified malignant neoplasm of axilla and upper limb lymph nodes: Secondary | ICD-10-CM

## 2013-06-11 DIAGNOSIS — K6289 Other specified diseases of anus and rectum: Secondary | ICD-10-CM

## 2013-06-11 DIAGNOSIS — K602 Anal fissure, unspecified: Secondary | ICD-10-CM

## 2013-06-11 LAB — CBC WITH DIFFERENTIAL/PLATELET
BASO%: 0.1 % (ref 0.0–2.0)
Eosinophils Absolute: 0 10*3/uL (ref 0.0–0.5)
LYMPH%: 6.4 % — ABNORMAL LOW (ref 14.0–49.7)
MCHC: 33.6 g/dL (ref 31.5–36.0)
MCV: 89.8 fL (ref 79.5–101.0)
MONO%: 2.5 % (ref 0.0–14.0)
NEUT#: 21.8 10*3/uL — ABNORMAL HIGH (ref 1.5–6.5)
Platelets: 395 10*3/uL (ref 145–400)
RBC: 3.42 10*6/uL — ABNORMAL LOW (ref 3.70–5.45)
RDW: 15.4 % — ABNORMAL HIGH (ref 11.2–14.5)
WBC: 24 10*3/uL — ABNORMAL HIGH (ref 3.9–10.3)

## 2013-06-11 MED ORDER — ALTEPLASE 2 MG IJ SOLR
2.0000 mg | Freq: Once | INTRAMUSCULAR | Status: AC | PRN
  Administered 2013-06-11: 2 mg
  Filled 2013-06-11: qty 2

## 2013-06-11 MED ORDER — PALONOSETRON HCL INJECTION 0.25 MG/5ML
0.2500 mg | Freq: Once | INTRAVENOUS | Status: AC
  Administered 2013-06-11: 0.25 mg via INTRAVENOUS

## 2013-06-11 MED ORDER — DEXAMETHASONE SODIUM PHOSPHATE 20 MG/5ML IJ SOLN
12.0000 mg | Freq: Once | INTRAMUSCULAR | Status: AC
  Administered 2013-06-11: 12 mg via INTRAVENOUS

## 2013-06-11 MED ORDER — SODIUM CHLORIDE 0.9 % IV SOLN
150.0000 mg | Freq: Once | INTRAVENOUS | Status: AC
  Administered 2013-06-11: 150 mg via INTRAVENOUS
  Filled 2013-06-11: qty 5

## 2013-06-11 MED ORDER — SODIUM CHLORIDE 0.9 % IV SOLN
75.0000 mg/m2 | Freq: Once | INTRAVENOUS | Status: AC
  Administered 2013-06-11: 150 mg via INTRAVENOUS
  Filled 2013-06-11: qty 15

## 2013-06-11 MED ORDER — SODIUM CHLORIDE 0.9 % IV SOLN
500.0000 mg/m2 | Freq: Once | INTRAVENOUS | Status: AC
  Administered 2013-06-11: 1020 mg via INTRAVENOUS
  Filled 2013-06-11: qty 51

## 2013-06-11 MED ORDER — DOXORUBICIN HCL CHEMO IV INJECTION 2 MG/ML
50.0000 mg/m2 | Freq: Once | INTRAVENOUS | Status: AC
  Administered 2013-06-11: 102 mg via INTRAVENOUS
  Filled 2013-06-11: qty 51

## 2013-06-11 NOTE — Progress Notes (Signed)
No blood return on initial port access.  Instilled cathflo.  Blood return achieved after 1.5 hr of cathflo instillation.

## 2013-06-11 NOTE — Progress Notes (Signed)
ID: [name] [name] OB: 16-Dec-1960  MR#: [id number]  WUJ#:[id number]  PCP: [name] Ip, MD GYN:   SU: [name] [name] OTHER MD: [name] [name]   HISTORY OF PRESENT ILLNESS: [name] [name] tells me she has not been able to have mammography for several years because of cost issues. More recently, she had noted a mass in her right breast but thought this might be MRSA. The urinary tract infection took her to her physician's office, and she brought the breast mass to the attention of [name] [name]. She set up the patient for bilateral diagnostic mammography and right breast ultrasound at the [facility] Center 03/27/2013, which showed a 4 cm irregular mass in the upper outer right breast, with a 9 mm cluster of calcifications in the posterior lower right breast and a few mildly enlarged level I right axillary lymph nodes. The breast mass was palpable and ultrasound showed it to measure 4.1 cm, with some of the level I right axillary lymph nodes noted to have lost their fatty hilum.  Biopsy of both the suspicious areas in the right breast as well as one of the suspicious lymph nodes was performed 03/27/2013 and showed ([id number]) the larger breast mass to consist of an invasive ductal carcinoma with lobular features, grade 2, estrogen and progesterone receptor both 100% positive, with an MIB-1 of 15%, and no HER-2 amplification. 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 The sampled right axillary lymph node was also positive.  Bilateral breast MRI 04/01/2013 showed the larger right breast mass to measure 5.4 cm, with a satellite nodule immediately inferior to it measuring 8 mm. 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 There were multiple enlarged right axillary lymph nodes, the largest measuring 2.8 cm. In addition, in the left breast there were 2 foci on non-masslike enhancement consistent with DCIS  measuring 1.3 cm and 2.2 cm. These weren't different quadrants.  The patient's subsequent history is as noted below  INTERVAL HISTORY: [name] [name] returns today accompanied by her daughter [name] [name] for followup of her right breast carcinoma. She is due for day 1 cycle 2 of 6 planned q. three-week doses of adjuvant docetaxel/doxorubicin/cyclophosphamide.  She receives Neulasta on day 2 for granulocyte support.  [name] [name] tells me she is "a new person" today. When she was seen here 2 weeks ago, she was having severe rectal pain associated with a rectal fissure. She was given a sitz bath to use at home, and was also given a prescription for diltiazem cream. The rectal pain has resolved, and she's had no additional problems with constipation and no rectal bleeding.  She's also completing her course of prophylactic Cipro and has had no fevers or chills. Her white counts have improved significantly.   REVIEW OF SYSTEMS: [name] [name] any rashes or skin changes. She has had no signs of abnormal bleeding. Her appetite is good. She gained back the weight she had lost following chemotherapy, but has also gained an additional 10 pounds. She admits that she has not been drinking as much water as she probably should, and she has been eating a lot of high sodium foods. She has had some slight swelling in her feet and ankles bilaterally. She [name] any nausea or emesis. She continues to have shortness of breath with exertion which has not worsened, but [name] any cough, phlegm production, or chest pain.  She [name] any abnormal headaches or dizziness.  She [name] any unusual myalgias, arthralgias, bony pain, or signs of peripheral neuropathy.   A detailed review of systems is otherwise stable and noncontributory.   PAST MEDICAL HISTORY: Past Medical History  Diagnosis Date  . Hypertension   . Anxiety   . Depression   . Headache(784.0)   . Sleep apnea     cpcp  . Shortness of breath   . Breast cancer     PAST  SURGICAL HISTORY: Past Surgical History  Procedure Laterality Date  . Mastectomy, radical Right 04/08/2013  . Simple mastectomy with axillary sentinel node biopsy Left 04/08/2013  . Mastectomy modified radical Right 04/07/2013    Procedure: RIGHT MASTECTOMY MODIFIED RADICAL;  Surgeon: Kandis Cocking, MD;  [facility]: [name] OR;  Service: General;  Laterality: Right;  . Simple mastectomy with axillary sentinel node biopsy Left 04/07/2013    Procedure:  LEFT SMPLE MASTECTOMY WITH AXILLARY SENTINEL NODE BIOPSY;  Surgeon: Kandis Cocking, MD;  Location: [facility] OR;  Service: General;  Laterality: Left;  . Portacath placement Left 04/07/2013    Procedure: INSERTION PORT-A-CATH;  Surgeon: Kandis Cocking, MD;  Location: [facility] OR;  Service: General;  Laterality: Left;    FAMILY HISTORY Family History  Problem Relation Age of Onset  . Breast cancer Mother 71  . Breast cancer Maternal Grandmother 76  . Prostate cancer Paternal Grandfather 69   the patient's father died at age 85 in an airplane crash (he was a Transport planner unsupervised). The patient's mother is alive at age 51. Her name is [name] [name] and she works for [name] [name]. [name] [name] has a history of breast cancer diagnosed at age 17. She had one sister who is alive and well. [name] [name]'s mother, the patient's grandmother, was diagnosed with breast cancer at age 65. She had 2 sisters, neither with breast cancer. [name] [name] herself had one brother, no sisters. There is no other history of breast or ovary cancer in the family.  GYNECOLOGIC HISTORY:  Menarche age 69, first live birth age 36. The patient was taking birth control pills until September. She stopped having periods at that time, but had one in late March.  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 Her husband [name] [name] (goes by "[name] [name]") is disabled secondary to diabetes. They are separated, but not divorced, and remain on good terms. [name] [name] lives with her mother [name] [name] and 8  pools. The patient's daughter [name] [name] also lives with the patient.    ADVANCED DIRECTIVES: In place. The patient has named her daughter [name] [name] as her healthcare power of attorney, bypassing her husband.  HEALTH MAINTENANCE: History  Substance Use Topics  . Smoking status: Never Smoker   . Smokeless tobacco: Never Used  . Alcohol Use: No     Colonoscopy: Never  PAP: Possibly 2006  Bone density: Possibly 2004  Lipid panel:  Allergies  Allergen Reactions  . Vicodin (Hydrocodone-Acetaminophen) Anaphylaxis  . Codeine Nausea And Vomiting  . Penicillins Rash  . Percocet (Oxycodone-Acetaminophen) Itching  . Septra (Bactrim) Other (See Comments)    Heart races    Current Outpatient Prescriptions  Medication Sig Dispense Refill  . acetaminophen (TYLENOL) 500 MG tablet Take 2 tablets (1,000 mg total) by mouth every 6 (six) hours as needed (Maximum 8 tabs in 24 hours). For pain  50 tablet  1  . ALPRAZolam (XANAX) 0.25 MG tablet Take 1 tablet by mouth Three times a day.      . ciprofloxacin (CIPRO) 500  MG tablet Take 1 tablet (500 mg total) by mouth 2 (two) times daily.  14 tablet  3  . dexamethasone (DECADRON) 4 MG tablet 2 tabs by mouth twice daily with food on day before chemo and 3 days after chemo  30 tablet  2  . diltiazem 2 % GEL Apply 1 application topically 2 (two) times daily.  30 g  1  . docusate sodium 100 MG CAPS Take 200 mg by mouth 2 (two) times daily.  10 capsule  0  . doxycycline (VIBRA-TABS) 100 MG tablet Take 1 tablet (100 mg total) by mouth 2 (two) times daily.  14 tablet  2  . escitalopram (LEXAPRO) 10 MG tablet Take 1 tablet by mouth daily.      Marland Kitchen lidocaine-prilocaine (EMLA) cream Apply topically as needed. 1-2 hours before each chemo  30 g  1  . loratadine (CLARITIN) 10 MG tablet 1 tablet by mouth once daily for 5 days after each dose of chemo  30 tablet  1  . meloxicam (MOBIC) 15 MG tablet Take 15 mg by mouth daily.      . ondansetron (ZOFRAN) 4  MG tablet Take 4 mg by mouth every 8 (eight) hours as needed for nausea.      . prochlorperazine (COMPAZINE) 10 MG tablet 1 tab by mouth with meals and bedtime x 3 days after chemo, then every 6 hrs as needed for nausea  30 tablet  2  . tobramycin-dexamethasone (TOBRADEX) ophthalmic solution Place 1 drop into both eyes 2 (two) times daily. For 7 days after each dose of chemo  5 mL  1  . traMADol (ULTRAM) 50 MG tablet Take 1 tablet (50 mg total) by mouth every 8 (eight) hours as needed for pain.  30 tablet  0  . triamterene-hydrochlorothiazide (MAXZIDE-25) 37.5-25 MG per tablet Take 1 tablet by mouth daily.       No current facility-administered medications for this visit.    OBJECTIVE:   Filed Vitals:   06/11/13 1109  BP: 145/84  Pulse: 73  Temp: 98 F (36.7 C)  Resp: 18     Body mass index is 40.43 kg/(m^2).    ECOG FS: 1 Filed Weights   06/11/13 1109  Weight: 221 lb 1.6 oz (100.29 kg)  Middle-aged white female who appears comfortable and is in no acute distress  Physical Exam: HEENT:  Sclerae anicteric.  Oropharynx clear. No ulcerations or evidence of thrush Nodes:  No cervical or supraclavicular lymphadenopathy palpated.  Breast Exam: Patient status post bilateral mastectomies with well healing incisions and no evidence of local recurrence.  No axillary adenopathy. Port is intact in the left upper chest wall, with no erythema, edema, or evidence of infection Lungs:  Clear to auscultation bilaterally.  No wheezes or rhonchi Heart:  Regular rate and rhythm.   Abdomen:  Soft, obese, nontender.  Positive bowel sounds.  Musculoskeletal:  No focal spinal tenderness to palpation.  Extremities:  No peripheral edema   Neuro:  Nonfocal. Well oriented with positive affect    LAB RESULTS:  Lab Results  Component Value Date   WBC 24.0* 06/11/2013   NEUTROABS 21.8* 06/11/2013   HGB 10.3* 06/11/2013   HCT 30.7* 06/11/2013   MCV 89.8 06/11/2013   PLT 395 06/11/2013      Chemistry       Component Value Date/Time   NA 136 05/28/2013 1139   NA 141 04/19/2013 0815   K 3.7 05/28/2013 1139   K 3.8 04/19/2013 0815  CL 103 05/20/2013 1404   CL 106 04/19/2013 0815   CO2 26 05/28/2013 1139   CO2 24 04/19/2013 0815   BUN 15.0 05/28/2013 1139   BUN 14 04/19/2013 0815   CREATININE 1.0 05/28/2013 1139   CREATININE 0.79 04/19/2013 0815      Component Value Date/Time   CALCIUM 10.1 05/28/2013 1139   CALCIUM 8.8 04/19/2013 0815   ALKPHOS 69 05/28/2013 1139   ALKPHOS 63 04/17/2013 0741   AST 26 05/28/2013 1139   AST 23 04/17/2013 0741   ALT 59* 05/28/2013 1139   ALT 26 04/17/2013 0741   BILITOT 0.91 05/28/2013 1139   BILITOT 0.2* 04/17/2013 0741       STUDIES:  Echocardiogram on 05/01/2013 showed an ejection fraction of 55-60%.    ASSESSMENT: 52 y.o. 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 There were also 2 suspicious areas in the left breast noted by MRI.  (2) s/p bilateral mastectomies with Left sentinel node sampling and Right axillary node dissection, showing  (a) on the left, no malignancy, negative sentinel node  (b) on the right, invasive lobular adenocarcinoma pT2 pN2, stage IIIA, grade 2, repeat HER-2 again not amplified  (3) genetics testing pending  (4)  due to initiate adjuvant chemotherapy, consisting of 6 cycles of docetaxel/doxorubicin/cyclophosphamide given on a Q. three-week basis, first dose scheduled for 05/21/2013.  Neulasta to be given on day 2 for granulocyte support.    (5)  Adjuvant chemotherapy will be followed by postmastectomy radiation therapy, then antiestrogen.  (6)  Rectal fissure,  improving  PLAN:  Sherree will proceed to treatment today as scheduled for day 1 cycle 2 of adjuvant docetaxel/doxorubicin/cyclophosphamide. She receive her Neulasta injection tomorrow, and we'll see Dr. Darnelle Catalan next week for assessment of chemotoxicity on July 23. I will plan on seeing her again in 3 weeks from today, August 6, in anticipation of her third cycle of chemotherapy. She is scheduled through mid September at this time.  Misty Stanley and [place] both voice understanding and agreement with this plan today. She will call with any problems prior to her next scheduled appointment.   Pamula Luther, PA-C   06/11/2013

## 2013-06-11 NOTE — Telephone Encounter (Signed)
Per staff message and POF I have scheduled appts.  JMW  

## 2013-06-11 NOTE — Patient Instructions (Addendum)
[facility] Discharge Instructions for Patients Receiving Chemotherapy  Today you received the following chemotherapy agents: Adriamycin, Taxotere, Cytoxan. To help prevent nausea and vomiting after your treatment, we encourage you to take your nausea medication.   If you develop nausea and vomiting that is not controlled by your nausea medication, call the clinic.   BELOW ARE SYMPTOMS THAT SHOULD BE REPORTED IMMEDIATELY:  *FEVER GREATER THAN 100.5 F  *CHILLS WITH OR WITHOUT FEVER  NAUSEA AND VOMITING THAT IS NOT CONTROLLED WITH YOUR NAUSEA MEDICATION  *UNUSUAL SHORTNESS OF BREATH  *UNUSUAL BRUISING OR BLEEDING  TENDERNESS IN MOUTH AND THROAT WITH OR WITHOUT PRESENCE OF ULCERS  *URINARY PROBLEMS  *BOWEL PROBLEMS  UNUSUAL RASH Items with * indicate a potential emergency and should be followed up as soon as possible.  Feel free to call the clinic you have any questions or concerns. The clinic phone number is [phone].

## 2013-06-11 NOTE — Telephone Encounter (Signed)
appts made and printed. Pt is aware that her tx will be added. i emailed MW to add tx...td 

## 2013-06-12 ENCOUNTER — Ambulatory Visit (HOSPITAL_BASED_OUTPATIENT_CLINIC_OR_DEPARTMENT_OTHER): Payer: Medicaid Other

## 2013-06-12 ENCOUNTER — Other Ambulatory Visit: Payer: Self-pay | Admitting: Physician Assistant

## 2013-06-12 VITALS — BP 149/58 | HR 65 | Temp 98.3°F

## 2013-06-12 DIAGNOSIS — Z5189 Encounter for other specified aftercare: Secondary | ICD-10-CM

## 2013-06-12 DIAGNOSIS — C50411 Malignant neoplasm of upper-outer quadrant of right female breast: Secondary | ICD-10-CM

## 2013-06-12 DIAGNOSIS — C50419 Malignant neoplasm of upper-outer quadrant of unspecified female breast: Secondary | ICD-10-CM

## 2013-06-12 MED ORDER — PEGFILGRASTIM INJECTION 6 MG/0.6ML
6.0000 mg | Freq: Once | SUBCUTANEOUS | Status: AC
  Administered 2013-06-12: 6 mg via SUBCUTANEOUS
  Filled 2013-06-12: qty 0.6

## 2013-06-13 ENCOUNTER — Telehealth: Payer: Self-pay | Admitting: *Deleted

## 2013-06-13 NOTE — Telephone Encounter (Signed)
sw pt daughter informed her that its been a schedule change for 07/23/13. gv appt d/t for labs@ 8:45am, ov @ 9:15am and tx to follow. Pt is aware....td

## 2013-06-15 ENCOUNTER — Encounter: Payer: Self-pay | Admitting: Oncology

## 2013-06-18 ENCOUNTER — Ambulatory Visit (HOSPITAL_BASED_OUTPATIENT_CLINIC_OR_DEPARTMENT_OTHER): Payer: Medicaid Other | Admitting: Oncology

## 2013-06-18 ENCOUNTER — Telehealth: Payer: Self-pay | Admitting: Oncology

## 2013-06-18 ENCOUNTER — Other Ambulatory Visit (HOSPITAL_BASED_OUTPATIENT_CLINIC_OR_DEPARTMENT_OTHER): Payer: Medicaid Other | Admitting: Lab

## 2013-06-18 VITALS — BP 134/85 | HR 120 | Temp 99.2°F | Resp 20 | Ht 62.0 in | Wt 200.6 lb

## 2013-06-18 DIAGNOSIS — C50411 Malignant neoplasm of upper-outer quadrant of right female breast: Secondary | ICD-10-CM

## 2013-06-18 DIAGNOSIS — C50419 Malignant neoplasm of upper-outer quadrant of unspecified female breast: Secondary | ICD-10-CM

## 2013-06-18 LAB — COMPREHENSIVE METABOLIC PANEL (CC13)
ALT: 13 U/L (ref 0–55)
AST: 8 U/L (ref 5–34)
CO2: 27 mEq/L (ref 22–29)
Calcium: 10.3 mg/dL (ref 8.4–10.4)
Chloride: 98 mEq/L (ref 98–109)
Potassium: 3.9 mEq/L (ref 3.5–5.1)
Sodium: 137 mEq/L (ref 136–145)
Total Protein: 7.7 g/dL (ref 6.4–8.3)

## 2013-06-18 LAB — CBC WITH DIFFERENTIAL/PLATELET
BASO%: 0.8 % (ref 0.0–2.0)
EOS%: 1.8 % (ref 0.0–7.0)
HCT: 38 % (ref 34.8–46.6)
MCHC: 33.4 g/dL (ref 31.5–36.0)
MONO#: 0.3 10*3/uL (ref 0.1–0.9)
RBC: 4.27 10*6/uL (ref 3.70–5.45)
RDW: 16.3 % — ABNORMAL HIGH (ref 11.2–14.5)
WBC: 2.9 10*3/uL — ABNORMAL LOW (ref 3.9–10.3)
lymph#: 1.7 10*3/uL (ref 0.9–3.3)

## 2013-06-18 MED ORDER — FLUCONAZOLE 100 MG PO TABS: 100.0000 mg | ORAL_TABLET | Freq: Every day | ORAL | Status: DC

## 2013-06-18 NOTE — Telephone Encounter (Signed)
Added genetics appt for 9/22 and gv pt new schedule.

## 2013-06-18 NOTE — Progress Notes (Signed)
ID: [name] OB: Feb 05, 1961  MR#: [id number]  WUJ#:[id number]  PCP: Raliegh Ip, MD GYN:   SU: Ovidio Kin OTHER MD: Chipper Herb   HISTORY OF PRESENT ILLNESS: Sidnee tells me she has not been able to have mammography for several years because of cost issues. More recently, she had noted a mass in her right breast but thought this might be MRSA. The urinary tract infection took her to her physician's office, and she brought the breast mass to the attention of [name]. She set up the patient for bilateral diagnostic mammography and right breast ultrasound at the [facility] Center 03/27/2013, which showed a 4 cm irregular mass in the upper outer right breast, with a 9 mm cluster of calcifications in the posterior lower right breast and a few mildly enlarged level I right axillary lymph nodes. The breast mass was palpable and ultrasound showed it to measure 4.1 cm, with some of the level I right axillary lymph nodes noted to have lost their fatty hilum.  Biopsy of both the suspicious areas in the right breast as well as one of the suspicious lymph nodes was performed 03/27/2013 and showed ([id number]) the larger breast mass to consist of an invasive ductal carcinoma with lobular features, grade 2, estrogen and progesterone receptor both 100% positive, with an MIB-1 of 15%, and no HER-2 amplification. 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 The sampled right axillary lymph node was also positive.  Bilateral breast MRI 04/01/2013 showed the larger right breast mass to measure 5.4 cm, with a satellite nodule immediately inferior to it measuring 8 mm. 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 There were multiple enlarged right axillary lymph nodes, the largest measuring 2.8 cm. In addition, in the left breast there were 2 foci on non-masslike enhancement consistent with DCIS  measuring 1.3 cm and 2.2 cm. These weren't different quadrants.  The patient's subsequent history is as noted below  INTERVAL HISTORY: Myriam returns today accompanied by her mother and her daughter [name] for followup of her right breast carcinoma. She is currently day 8 cycle 2 of 6 planned q. three-week doses of adjuvant docetaxel/doxorubicin/cyclophosphamide.   REVIEW OF SYSTEMS: Debanhi  does well while taking the steroids, but then tends to crash when they run out of her. In addition her daughter had a sinus infection which Rickiya proceeded to pick up. She has been "sick" the last 5 days, although with no fever, no nausea or vomiting. She is just HCT and very tired. She feels short of breath when walking or emesis she does have a history of sleep apnea). There have been no headaches, no dizziness or gait imbalance. She has mild thrush. She has had cough productive of clear to slightly yellow phlegm. She feels clammy. A detailed review of systems today was otherwise noncontributory  PAST MEDICAL HISTORY: Past Medical History  Diagnosis Date  . Hypertension   . Anxiety   . Depression   . Headache(784.0)   . Sleep apnea     cpcp  . Shortness of breath   . Breast cancer     PAST SURGICAL HISTORY: Past Surgical History  Procedure Laterality Date  . Mastectomy, radical Right 04/08/2013  . Simple mastectomy with axillary sentinel node biopsy Left 04/08/2013  . Mastectomy modified radical Right 04/07/2013    Procedure: RIGHT MASTECTOMY MODIFIED RADICAL;  Surgeon: Kandis Cocking, MD;  Location: [facility] OR;  Service: General;  Laterality: Right;  . Simple mastectomy with axillary sentinel node biopsy Left 04/07/2013    Procedure:  LEFT SMPLE MASTECTOMY WITH AXILLARY SENTINEL NODE BIOPSY;  Surgeon: Kandis Cocking, MD;  Location: [facility] OR;  Service: General;  Laterality: Left;  . Portacath placement Left 04/07/2013    Procedure: INSERTION PORT-A-CATH;  Surgeon: Kandis Cocking, MD;  Location: [facility] OR;  Service:  General;  Laterality: Left;    FAMILY HISTORY Family History  Problem Relation Age of Onset  . Breast cancer Mother 63  . Breast cancer Maternal Grandmother 65  . Prostate cancer Paternal Grandfather 82   the patient's father died at age 29 in an airplane crash (he was a Transport planner unsupervised). The patient's mother is alive at age 8. Her name is [name] and she works for [employer]. [name] has a history of breast cancer diagnosed at age 80. She had one sister who is alive and well. [name]'s mother, the patient's grandmother, was diagnosed with breast cancer at age 17. She had 2 sisters, neither with breast cancer. Kariyah herself had one brother, no sisters. There is no other history of breast or ovary cancer in the family.  GYNECOLOGIC HISTORY:  Menarche age 72, she is GX P40, first live birth age 56. The patient was taking birth control pills until September 2013. She stopped having periods at that time, but had one in late March 2014.  SOCIAL HISTORY:  Laniah worked as a Sales executive for 30 years. Her husband Eufemia Prindle (goes by "[name]") is disabled secondary to diabetes. They are separated, but not divorced, and remain on good terms. Shalissa lives with her mother [name] and 8 poodles. The patient's daughter [name] "[name]" Cahoon also lives with the patient.    ADVANCED DIRECTIVES: In place. The patient has named her daughter [name] as her healthcare power of attorney, bypassing her husband.  HEALTH MAINTENANCE: History  Substance Use Topics  . Smoking status: Never Smoker   . Smokeless tobacco: Never Used  . Alcohol Use: No     Colonoscopy: Never  PAP: Possibly 2006  Bone density: Possibly 2004  Lipid panel:  Allergies  Allergen Reactions  . Vicodin (Hydrocodone-Acetaminophen) Anaphylaxis  . Codeine Nausea And Vomiting  . Penicillins Rash  . Percocet (Oxycodone-Acetaminophen) Itching  . Septra (Bactrim) Other (See Comments)    Heart races     Current Outpatient Prescriptions  Medication Sig Dispense Refill  . acetaminophen (TYLENOL) 500 MG tablet Take 2 tablets (1,000 mg total) by mouth every 6 (six) hours as needed (Maximum 8 tabs in 24 hours). For pain  50 tablet  1  . ALPRAZolam (XANAX) 0.25 MG tablet Take 1 tablet by mouth Three times a day.      . ciprofloxacin (CIPRO) 500 MG tablet Take 1 tablet (500 mg total) by mouth 2 (two) times daily.  14 tablet  3  . dexamethasone (DECADRON) 4 MG tablet 2 tabs by mouth twice daily with food on day before chemo and 3 days after chemo  30 tablet  2  . diltiazem 2 % GEL Apply 1 application topically 2 (two) times daily.  30 g  1  . docusate sodium 100 MG CAPS Take 200 mg by mouth 2 (two) times daily.  10 capsule  0  . doxycycline (VIBRA-TABS) 100 MG tablet Take 1 tablet (100 mg total) by mouth 2 (two) times daily.  14 tablet  2  . escitalopram (LEXAPRO) 10 MG  tablet Take 1 tablet by mouth daily.      Marland Kitchen lidocaine-prilocaine (EMLA) cream Apply topically as needed. 1-2 hours before each chemo  30 g  1  . loratadine (CLARITIN) 10 MG tablet 1 tablet by mouth once daily for 5 days after each dose of chemo  30 tablet  1  . meloxicam (MOBIC) 15 MG tablet Take 15 mg by mouth daily.      . ondansetron (ZOFRAN) 4 MG tablet Take 4 mg by mouth every 8 (eight) hours as needed for nausea.      . prochlorperazine (COMPAZINE) 10 MG tablet 1 tab by mouth with meals and bedtime x 3 days after chemo, then every 6 hrs as needed for nausea  30 tablet  2  . tobramycin-dexamethasone (TOBRADEX) ophthalmic solution Place 1 drop into both eyes 2 (two) times daily. For 7 days after each dose of chemo  5 mL  1  . traMADol (ULTRAM) 50 MG tablet Take 1 tablet (50 mg total) by mouth every 8 (eight) hours as needed for pain.  30 tablet  0  . triamterene-hydrochlorothiazide (MAXZIDE-25) 37.5-25 MG per tablet Take 1 tablet by mouth daily.       No current facility-administered medications for this visit.    OBJECTIVE:    Filed Vitals:   06/18/13 1404  BP: 134/85  Pulse: 120  Temp: 99.2 F (37.3 C)  Resp: 20     Body mass index is 36.68 kg/(m^2).    ECOG FS: 1 Filed Weights   06/18/13 1404  Weight: 200 lb 9.6 oz (90.992 kg)  Middle-aged white female wrapped in a blanket Sclerae unicteric Oropharynx shows an early thrush No cervical or supraclavicular adenopathy Lungs no rales or rhonchi, fair excursion bilaterally Heart regular rate and rhythm Abd obese, benign MSK no focal spinal tenderness Neuro: nonfocal, well oriented, friendly affect Breasts: Deferred      LAB RESULTS:  Lab Results  Component Value Date   WBC 2.9* 06/18/2013   NEUTROABS 0.9* 06/18/2013   HGB 12.7 06/18/2013   HCT 38.0 06/18/2013   MCV 88.9 06/18/2013   PLT 301 06/18/2013      Chemistry      Component Value Date/Time   NA 137 06/18/2013 1331   NA 141 04/19/2013 0815   K 3.9 06/18/2013 1331   K 3.8 04/19/2013 0815   CL 103 05/20/2013 1404   CL 106 04/19/2013 0815   CO2 27 06/18/2013 1331   CO2 24 04/19/2013 0815   BUN 11.7 06/18/2013 1331   BUN 14 04/19/2013 0815   CREATININE 0.9 06/18/2013 1331   CREATININE 0.79 04/19/2013 0815      Component Value Date/Time   CALCIUM 10.3 06/18/2013 1331   CALCIUM 8.8 04/19/2013 0815   ALKPHOS 67 06/18/2013 1331   ALKPHOS 63 04/17/2013 0741   AST 8 06/18/2013 1331   AST 23 04/17/2013 0741   ALT 13 06/18/2013 1331   ALT 26 04/17/2013 0741   BILITOT 0.53 06/18/2013 1331   BILITOT 0.2* 04/17/2013 0741       STUDIES:  Echocardiogram on 05/01/2013 showed an ejection fraction of 55-60%.    ASSESSMENT: 52 y.o. 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 There were also 2 suspicious areas in the left breast noted by MRI.  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 Patient's testing pending  (4)  started adjuvant chemotherapy, consisting of 6 planned cycles of docetaxel/ doxorubicin/ cyclophosphamide given on a Q. three-week basis, first dose  05/21/2013.    (5)  Adjuvant chemotherapy will be followed by postmastectomy radiation therapy, then antiestrogen.  (6)  Rectal fissure, improving  PLAN:  Destini is pretty miserable right now but overall is tolerating treatment fairly well. It is unfortunate that she has the sinus issues on top of the post chemotherapy issues. I am hoping her third cycle will be a little bit better. I have encouraged her to walk every day him a 5 minutes at a time, 3-6 times, so she can get 15-30 minutes a day out of it. She should stay at a bit during the day. I wrote her to take tamoxifen 100 mg daily for 5 days and call if her thrush has not cleared after that. Otherwise she knows to call for any problems that may develop before her next visit here.   Lowella Dell, MD   06/18/2013

## 2013-06-19 ENCOUNTER — Ambulatory Visit: Payer: Medicaid Other

## 2013-07-02 ENCOUNTER — Other Ambulatory Visit (HOSPITAL_BASED_OUTPATIENT_CLINIC_OR_DEPARTMENT_OTHER): Payer: Medicaid Other | Admitting: Lab

## 2013-07-02 ENCOUNTER — Telehealth: Payer: Self-pay | Admitting: Emergency Medicine

## 2013-07-02 ENCOUNTER — Telehealth: Payer: Self-pay | Admitting: *Deleted

## 2013-07-02 ENCOUNTER — Ambulatory Visit (HOSPITAL_BASED_OUTPATIENT_CLINIC_OR_DEPARTMENT_OTHER): Payer: Medicaid Other | Admitting: Physician Assistant

## 2013-07-02 ENCOUNTER — Encounter: Payer: Self-pay | Admitting: Physician Assistant

## 2013-07-02 ENCOUNTER — Ambulatory Visit (HOSPITAL_BASED_OUTPATIENT_CLINIC_OR_DEPARTMENT_OTHER): Payer: Medicaid Other

## 2013-07-02 VITALS — BP 130/76 | HR 84 | Temp 98.7°F | Resp 20 | Ht 62.0 in | Wt 215.7 lb

## 2013-07-02 DIAGNOSIS — B370 Candidal stomatitis: Secondary | ICD-10-CM

## 2013-07-02 DIAGNOSIS — C773 Secondary and unspecified malignant neoplasm of axilla and upper limb lymph nodes: Secondary | ICD-10-CM

## 2013-07-02 DIAGNOSIS — Z5111 Encounter for antineoplastic chemotherapy: Secondary | ICD-10-CM

## 2013-07-02 DIAGNOSIS — K602 Anal fissure, unspecified: Secondary | ICD-10-CM

## 2013-07-02 DIAGNOSIS — C50419 Malignant neoplasm of upper-outer quadrant of unspecified female breast: Secondary | ICD-10-CM

## 2013-07-02 DIAGNOSIS — C50411 Malignant neoplasm of upper-outer quadrant of right female breast: Secondary | ICD-10-CM

## 2013-07-02 DIAGNOSIS — D649 Anemia, unspecified: Secondary | ICD-10-CM | POA: Insufficient documentation

## 2013-07-02 DIAGNOSIS — Z170 Estrogen receptor positive status [ER+]: Secondary | ICD-10-CM

## 2013-07-02 DIAGNOSIS — R0602 Shortness of breath: Secondary | ICD-10-CM

## 2013-07-02 DIAGNOSIS — D72829 Elevated white blood cell count, unspecified: Secondary | ICD-10-CM

## 2013-07-02 LAB — COMPREHENSIVE METABOLIC PANEL (CC13)
ALT: 8 U/L (ref 0–55)
AST: 9 U/L (ref 5–34)
CO2: 21 mEq/L — ABNORMAL LOW (ref 22–29)
Creatinine: 1 mg/dL (ref 0.6–1.1)
Sodium: 139 mEq/L (ref 136–145)
Total Bilirubin: <0.2 mg/dL (ref 0.20–1.20)
Total Protein: 6.7 g/dL (ref 6.4–8.3)

## 2013-07-02 LAB — CBC WITH DIFFERENTIAL/PLATELET
BASO%: 0.3 % (ref 0.0–2.0)
EOS%: 0 % (ref 0.0–7.0)
LYMPH%: 5.4 % — ABNORMAL LOW (ref 14.0–49.7)
MCH: 30.9 pg (ref 25.1–34.0)
MCHC: 34.9 g/dL (ref 31.5–36.0)
MONO#: 0.5 10*3/uL (ref 0.1–0.9)
NEUT%: 92.3 % — ABNORMAL HIGH (ref 38.4–76.8)
RBC: 3.26 10*6/uL — ABNORMAL LOW (ref 3.70–5.45)
WBC: 23.7 10*3/uL — ABNORMAL HIGH (ref 3.9–10.3)
lymph#: 1.3 10*3/uL (ref 0.9–3.3)

## 2013-07-02 MED ORDER — DOXORUBICIN HCL CHEMO IV INJECTION 2 MG/ML
50.0000 mg/m2 | Freq: Once | INTRAVENOUS | Status: AC
  Administered 2013-07-02: 102 mg via INTRAVENOUS
  Filled 2013-07-02: qty 51

## 2013-07-02 MED ORDER — SODIUM CHLORIDE 0.9 % IV SOLN
Freq: Once | INTRAVENOUS | Status: AC
  Administered 2013-07-02: 13:00:00 via INTRAVENOUS

## 2013-07-02 MED ORDER — SODIUM CHLORIDE 0.9 % IJ SOLN
10.0000 mL | INTRAMUSCULAR | Status: DC | PRN
  Filled 2013-07-02: qty 10

## 2013-07-02 MED ORDER — SODIUM CHLORIDE 0.9 % IV SOLN
150.0000 mg | Freq: Once | INTRAVENOUS | Status: AC
  Administered 2013-07-02: 150 mg via INTRAVENOUS
  Filled 2013-07-02: qty 5

## 2013-07-02 MED ORDER — DOCETAXEL CHEMO INJECTION 160 MG/16ML
75.0000 mg/m2 | Freq: Once | INTRAVENOUS | Status: AC
  Administered 2013-07-02: 150 mg via INTRAVENOUS
  Filled 2013-07-02: qty 15

## 2013-07-02 MED ORDER — FLUCONAZOLE 100 MG PO TABS: 100.0000 mg | ORAL_TABLET | Freq: Every day | ORAL | Status: AC

## 2013-07-02 MED ORDER — DEXAMETHASONE SODIUM PHOSPHATE 20 MG/5ML IJ SOLN
12.0000 mg | Freq: Once | INTRAMUSCULAR | Status: AC
  Administered 2013-07-02: 12 mg via INTRAVENOUS

## 2013-07-02 MED ORDER — HEPARIN SOD (PORK) LOCK FLUSH 100 UNIT/ML IV SOLN
500.0000 [IU] | Freq: Once | INTRAVENOUS | Status: AC | PRN
  Filled 2013-07-02: qty 5

## 2013-07-02 MED ORDER — PALONOSETRON HCL INJECTION 0.25 MG/5ML
0.2500 mg | Freq: Once | INTRAVENOUS | Status: AC
  Administered 2013-07-02: 0.25 mg via INTRAVENOUS

## 2013-07-02 MED ORDER — SODIUM CHLORIDE 0.9 % IV SOLN
500.0000 mg/m2 | Freq: Once | INTRAVENOUS | Status: AC
  Administered 2013-07-02: 1020 mg via INTRAVENOUS
  Filled 2013-07-02: qty 51

## 2013-07-02 NOTE — Telephone Encounter (Signed)
Received a call from Megan at [facility] concerning a medication interaction between Cipro and Lexapro.  Patient is tolerating both medications well without adverse effects and instructed pharmacy to continue both prescriptions per Dr Darnelle Catalan.

## 2013-07-02 NOTE — Progress Notes (Signed)
ID: [name] OB: [birth year]  MR#: [id number]  JYN#:[id number]  PCP: Raliegh Ip, MD GYN:   SU: Ovidio Kin OTHER MD: Chipper Herb;  Romie Levee;  Dorene Grebe   HISTORY OF PRESENT ILLNESS: Arnell tells me she has not been able to have mammography for several years because of cost issues. More recently, she had noted a mass in her right breast but thought this might be MRSA. The urinary tract infection took her to her physician's office, and she brought the breast mass to the attention of [name]. She set up the patient for bilateral diagnostic mammography and right breast ultrasound at the breast Center 03/27/2013, which showed a 4 cm irregular mass in the upper outer right breast, with a 9 mm cluster of calcifications in the posterior lower right breast and a few mildly enlarged level I right axillary lymph nodes. The breast mass was palpable and ultrasound showed it to measure 4.1 cm, with some of the level I right axillary lymph nodes noted to have lost their fatty hilum.  Biopsy of both the suspicious areas in the right breast as well as one of the suspicious lymph nodes was performed 03/27/2013 and showed ([id number]) the larger breast mass to consist of an invasive ductal carcinoma with lobular features, grade 2, estrogen and progesterone receptor both 100% positive, with an MIB-1 of 15%, and no HER-2 amplification. 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 The sampled right axillary lymph node was also positive.  Bilateral breast MRI 04/01/2013 showed the larger right breast mass to measure 5.4 cm, with a satellite nodule immediately inferior to it measuring 8 mm. 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 There were multiple enlarged right axillary lymph nodes, the largest measuring 2.8 cm. In addition, in the left breast there were 2 foci on non-masslike  enhancement consistent with DCIS measuring 1.3 cm and 2.2 cm. These weren't different quadrants.  The patient's subsequent history is as noted below  INTERVAL HISTORY: Anny returns today accompanied by her mother for followup of her right breast carcinoma. She is due for day 1 cycle 3 of 6 planned q. three-week doses of adjuvant docetaxel/doxorubicin/cyclophosphamide. She receives Neulasta on day 2 for granulocyte support.  Interval history is notable for the fact that [place] has been helping her family move this week. This has kept her busy and active, and she tells me she "feels pretty good today".  REVIEW OF SYSTEMS: Kinslie has had no fevers or chills. She denies any rashes, skin changes, or abnormal bleeding. Her appetite is great. She's had some fleeting nausea for which she takes her antinausea medication affectively. She's had no emesis and denies any change in bowel or bladder habits. She's had no additional rectal pain, no problems with the rectal fissure, and no rectal bleeding. She continues to have shortness of breath with exertion which is stable. She's had no new cough or phlegm production, and denies any chest pain. She's had no abnormal headaches or dizziness. She currently denies any new or unusual myalgias, arthralgias, or bony pain. She's had no peripheral swelling.  A detailed review of systems is otherwise stable and noncontributory.   PAST MEDICAL HISTORY: Past Medical History  Diagnosis Date  . Hypertension   . Anxiety   . Depression   . Headache(784.0)   . Sleep apnea     cpcp  . Shortness of  breath   . Breast cancer     PAST SURGICAL HISTORY: Past Surgical History  Procedure Laterality Date  . Mastectomy, radical Right 04/08/2013  . Simple mastectomy with axillary sentinel node biopsy Left 04/08/2013  . Mastectomy modified radical Right 04/07/2013    Procedure: RIGHT MASTECTOMY MODIFIED RADICAL;  Surgeon: Kandis Cocking, MD;  Location: [facility] OR;  Service: General;   Laterality: Right;  . Simple mastectomy with axillary sentinel node biopsy Left 04/07/2013    Procedure:  LEFT SMPLE MASTECTOMY WITH AXILLARY SENTINEL NODE BIOPSY;  Surgeon: Kandis Cocking, MD;  Location: [facility] OR;  Service: General;  Laterality: Left;  . Portacath placement Left 04/07/2013    Procedure: INSERTION PORT-A-CATH;  Surgeon: Kandis Cocking, MD;  Location: [facility] OR;  Service: General;  Laterality: Left;    FAMILY HISTORY Family History  Problem Relation Age of Onset  . Breast cancer Mother 47  . Breast cancer Maternal Grandmother 17  . Prostate cancer Paternal Grandfather 54   the patient's father died at age 43 in an airplane crash (he was a Transport planner unsupervised). The patient's mother is alive at age 94. Her name is [name] and she works for [employer]. [name] has a history of breast cancer diagnosed at age 37. She had one sister who is alive and well. [name]'s mother, the patient's grandmother, was diagnosed with breast cancer at age 66. She had 2 sisters, neither with breast cancer. Kemberly herself had one brother, no sisters. There is no other history of breast or ovary cancer in the family.  GYNECOLOGIC HISTORY:  Menarche age 59, she is GX P60, first live birth age 27. The patient was taking birth control pills until September 2013. She stopped having periods at that time, but had one in late March 2014.  SOCIAL HISTORY:  Sharalyn worked as a Sales executive for 30 years. Her husband Aneri Slagel (goes by "[name]") is disabled secondary to diabetes. They are separated, but not divorced, and remain on good terms. Randy lives with her mother [name] and 8 poodles. The patient's daughter [name] "[name]" Sangiovanni also lives with the patient.    ADVANCED DIRECTIVES: In place. The patient has named her daughter [name] as her healthcare power of attorney, bypassing her husband.  HEALTH MAINTENANCE: History  Substance Use Topics  . Smoking status: Never Smoker   . Smokeless  tobacco: Never Used  . Alcohol Use: No     Colonoscopy: Never  PAP: Possibly 2006  Bone density: Possibly 2004  Lipid panel:  Allergies  Allergen Reactions  . Vicodin (Hydrocodone-Acetaminophen) Anaphylaxis  . Codeine Nausea And Vomiting  . Penicillins Rash  . Percocet (Oxycodone-Acetaminophen) Itching  . Septra (Bactrim) Other (See Comments)    Heart races    Current Outpatient Prescriptions  Medication Sig Dispense Refill  . acetaminophen (TYLENOL) 500 MG tablet Take 2 tablets (1,000 mg total) by mouth every 6 (six) hours as needed (Maximum 8 tabs in 24 hours). For pain  50 tablet  1  . ALPRAZolam (XANAX) 0.25 MG tablet Take 1 tablet by mouth Three times a day.      . ciprofloxacin (CIPRO) 500 MG tablet Take 1 tablet (500 mg total) by mouth 2 (two) times daily.  14 tablet  3  . dexamethasone (DECADRON) 4 MG tablet 2 tabs by mouth twice daily with food on day before chemo and 3 days after chemo  30 tablet  2  . diltiazem 2 % GEL Apply 1 application topically 2 (  two) times daily.  30 g  1  . docusate sodium 100 MG CAPS Take 200 mg by mouth 2 (two) times daily.  10 capsule  0  . doxycycline (VIBRA-TABS) 100 MG tablet Take 1 tablet (100 mg total) by mouth 2 (two) times daily.  14 tablet  2  . escitalopram (LEXAPRO) 10 MG tablet Take 1 tablet by mouth daily.      . fluconazole (DIFLUCAN) 100 MG tablet Take 1 tablet (100 mg total) by mouth daily.  14 tablet  1  . lidocaine-prilocaine (EMLA) cream Apply topically as needed. 1-2 hours before each chemo  30 g  1  . loratadine (CLARITIN) 10 MG tablet 1 tablet by mouth once daily for 5 days after each dose of chemo  30 tablet  1  . meloxicam (MOBIC) 15 MG tablet Take 15 mg by mouth daily.      . ondansetron (ZOFRAN) 4 MG tablet Take 4 mg by mouth every 8 (eight) hours as needed for nausea.      . prochlorperazine (COMPAZINE) 10 MG tablet 1 tab by mouth with meals and bedtime x 3 days after chemo, then every 6 hrs as needed for nausea  30  tablet  2  . tobramycin-dexamethasone (TOBRADEX) ophthalmic solution Place 1 drop into both eyes 2 (two) times daily. For 7 days after each dose of chemo  5 mL  1  . traMADol (ULTRAM) 50 MG tablet Take 1 tablet (50 mg total) by mouth every 8 (eight) hours as needed for pain.  30 tablet  0  . triamterene-hydrochlorothiazide (MAXZIDE-25) 37.5-25 MG per tablet Take 1 tablet by mouth daily.       No current facility-administered medications for this visit.    OBJECTIVE:   Filed Vitals:   07/02/13 1150  BP: 130/76  Pulse: 84  Temp: 98.7 F (37.1 C)  Resp: 20     Body mass index is 39.44 kg/(m^2).    ECOG FS: 1 Filed Weights   07/02/13 1150  Weight: 215 lb 11.2 oz (97.841 kg)  Middle-aged white female who appears comfortable and is in no acute distress  Sclerae unicteric Oropharynx clear with no ulcerations or evidence of current thrush  No cervical or supraclavicular adenopathy Lungs clear to auscultation bilaterally, no rales or rhonchi, fair excursion bilaterally Heart regular rate and rhythm Abd obese, soft, nontender to palpation, positive bowel sounds MSK no focal spinal tenderness No peripheral edema Neuro: nonfocal, well oriented, friendly affect Breasts: Deferred.  Axillae are benign bilaterally palpable adenopathy noted.      LAB RESULTS:  Lab Results  Component Value Date   WBC 23.7* 07/02/2013   NEUTROABS 21.9* 07/02/2013   HGB 10.1* 07/02/2013   HCT 28.9* 07/02/2013   MCV 88.7 07/02/2013   PLT 365 07/02/2013      Chemistry      Component Value Date/Time   NA 139 07/02/2013 1117   NA 141 04/19/2013 0815   K 3.9 07/02/2013 1117   K 3.8 04/19/2013 0815   CL 103 05/20/2013 1404   CL 106 04/19/2013 0815   CO2 21* 07/02/2013 1117   CO2 24 04/19/2013 0815   BUN 10.7 07/02/2013 1117   BUN 14 04/19/2013 0815   CREATININE 1.0 07/02/2013 1117   CREATININE 0.79 04/19/2013 0815      Component Value Date/Time   CALCIUM 9.5 07/02/2013 1117   CALCIUM 8.8 04/19/2013 0815   ALKPHOS 53  07/02/2013 1117   ALKPHOS 63 04/17/2013 0741   AST  9 07/02/2013 1117   AST 23 04/17/2013 0741   ALT 8 07/02/2013 1117   ALT 26 04/17/2013 0741   BILITOT <0.20 07/02/2013 1117   BILITOT 0.2* 04/17/2013 0741       STUDIES:  Echocardiogram on 05/01/2013 showed an ejection fraction of 55-60%.    ASSESSMENT: 52 y.o. 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 There were also 2 suspicious areas in the left breast noted by MRI.  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 Patient's testing pending  (5)  started adjuvant chemotherapy, consisting of 6 planned cycles of docetaxel/ doxorubicin/ cyclophosphamide given on a Q. three-week basis, first dose  05/21/2013.    (6)  Adjuvant chemotherapy will be followed by postmastectomy radiation therapy, then antiestrogen.  (7)  Rectal fissure, improving    PLAN:  Reilly will proceed to treatment today as scheduled for her third cycle of adjuvant docetaxel/doxorubicin/cyclophosphamide. She will return tomorrow for her Neulasta injection on day 2, and I will see her next week on August 13 for assessment of chemotoxicity. Of course we will repeat labs at that time, and decide whether or not she needs to restart  Cipro. I am refilling her Diflucan which she takes to control oral candidiasis.  All this was reviewed in detail with Misty Stanley, and she voices understanding and agreement with this plan. She will call with any changes or problems prior to her next appointment.   Toniette Devera, PA-C   07/02/2013

## 2013-07-02 NOTE — Telephone Encounter (Signed)
appts made and printed. Pt is aware that tx will follow on 09/03/13. i emailed MB to add tx...td

## 2013-07-02 NOTE — Progress Notes (Unsigned)
Adriamycin pushed through normal saline dripping wide open, blood return noted before every 3 mL during the administration and after the administration of Adriamycin.

## 2013-07-02 NOTE — Patient Instructions (Signed)
[facility] Discharge Instructions for Patients Receiving Chemotherapy  Today you received the following chemotherapy agents Adriamycin/Cytoxan/Taxotere.  To help prevent nausea and vomiting after your treatment, we encourage you to take your nausea medication as prescribed.   If you develop nausea and vomiting that is not controlled by your nausea medication, call the clinic.   BELOW ARE SYMPTOMS THAT SHOULD BE REPORTED IMMEDIATELY:  *FEVER GREATER THAN 100.5 F  *CHILLS WITH OR WITHOUT FEVER  NAUSEA AND VOMITING THAT IS NOT CONTROLLED WITH YOUR NAUSEA MEDICATION  *UNUSUAL SHORTNESS OF BREATH  *UNUSUAL BRUISING OR BLEEDING  TENDERNESS IN MOUTH AND THROAT WITH OR WITHOUT PRESENCE OF ULCERS  *URINARY PROBLEMS  *BOWEL PROBLEMS  UNUSUAL RASH Items with * indicate a potential emergency and should be followed up as soon as possible.  Feel free to call the clinic you have any questions or concerns. The clinic phone number is [phone].    

## 2013-07-03 ENCOUNTER — Ambulatory Visit (HOSPITAL_BASED_OUTPATIENT_CLINIC_OR_DEPARTMENT_OTHER): Payer: Medicaid Other

## 2013-07-03 ENCOUNTER — Ambulatory Visit: Payer: Medicaid Other

## 2013-07-03 VITALS — BP 146/70 | HR 62 | Temp 98.0°F

## 2013-07-03 DIAGNOSIS — C50419 Malignant neoplasm of upper-outer quadrant of unspecified female breast: Secondary | ICD-10-CM

## 2013-07-03 DIAGNOSIS — C50411 Malignant neoplasm of upper-outer quadrant of right female breast: Secondary | ICD-10-CM

## 2013-07-03 MED ORDER — PEGFILGRASTIM INJECTION 6 MG/0.6ML
6.0000 mg | Freq: Once | SUBCUTANEOUS | Status: AC
  Administered 2013-07-03: 6 mg via SUBCUTANEOUS
  Filled 2013-07-03: qty 0.6

## 2013-07-09 ENCOUNTER — Ambulatory Visit (HOSPITAL_BASED_OUTPATIENT_CLINIC_OR_DEPARTMENT_OTHER): Payer: Medicaid Other | Admitting: Physician Assistant

## 2013-07-09 ENCOUNTER — Other Ambulatory Visit (HOSPITAL_BASED_OUTPATIENT_CLINIC_OR_DEPARTMENT_OTHER): Payer: Medicaid Other | Admitting: Lab

## 2013-07-09 ENCOUNTER — Encounter: Payer: Self-pay | Admitting: Physician Assistant

## 2013-07-09 VITALS — BP 116/69 | HR 128 | Temp 99.6°F | Resp 20 | Ht 62.0 in | Wt 198.5 lb

## 2013-07-09 DIAGNOSIS — C50419 Malignant neoplasm of upper-outer quadrant of unspecified female breast: Secondary | ICD-10-CM

## 2013-07-09 DIAGNOSIS — Z171 Estrogen receptor negative status [ER-]: Secondary | ICD-10-CM

## 2013-07-09 DIAGNOSIS — C50411 Malignant neoplasm of upper-outer quadrant of right female breast: Secondary | ICD-10-CM

## 2013-07-09 DIAGNOSIS — C773 Secondary and unspecified malignant neoplasm of axilla and upper limb lymph nodes: Secondary | ICD-10-CM

## 2013-07-09 DIAGNOSIS — R5381 Other malaise: Secondary | ICD-10-CM

## 2013-07-09 DIAGNOSIS — D702 Other drug-induced agranulocytosis: Secondary | ICD-10-CM

## 2013-07-09 DIAGNOSIS — K602 Anal fissure, unspecified: Secondary | ICD-10-CM

## 2013-07-09 DIAGNOSIS — R0602 Shortness of breath: Secondary | ICD-10-CM

## 2013-07-09 DIAGNOSIS — R110 Nausea: Secondary | ICD-10-CM

## 2013-07-09 LAB — CBC WITH DIFFERENTIAL/PLATELET
BASO%: 0.3 % (ref 0.0–2.0)
Eosinophils Absolute: 0.1 10*3/uL (ref 0.0–0.5)
LYMPH%: 56 % — ABNORMAL HIGH (ref 14.0–49.7)
MCHC: 34.5 g/dL (ref 31.5–36.0)
MCV: 88.8 fL (ref 79.5–101.0)
MONO#: 0.3 10*3/uL (ref 0.1–0.9)
MONO%: 9.6 % (ref 0.0–14.0)
NEUT#: 0.9 10*3/uL — ABNORMAL LOW (ref 1.5–6.5)
Platelets: 157 10*3/uL (ref 145–400)
RBC: 3.83 10*6/uL (ref 3.70–5.45)
RDW: 17.7 % — ABNORMAL HIGH (ref 11.2–14.5)
WBC: 2.9 10*3/uL — ABNORMAL LOW (ref 3.9–10.3)

## 2013-07-09 NOTE — Progress Notes (Signed)
ID: [name] OB: [birth year]  MR#: [id number]  WUJ#:[id number]  PCP: [name] Ip, MD GYN:   SU: [name] [name] OTHER MD: [name] [name];  [name] [name];  [name] [name]   HISTORY OF PRESENT ILLNESS: [name] [name] tells me she has not been able to have mammography for several years because of cost issues. More recently, she had noted a mass in her right breast but thought this might be MRSA. The urinary tract infection took her to her physician's office, and she brought the breast mass to the attention of [name] Pisciotta NP. She set up the patient for bilateral diagnostic mammography and right breast ultrasound at the [facility] Center 03/27/2013, which showed a 4 cm irregular mass in the upper outer right breast, with a 9 mm cluster of calcifications in the posterior lower right breast and a few mildly enlarged level I right axillary lymph nodes. The breast mass was palpable and ultrasound showed it to measure 4.1 cm, with some of the level I right axillary lymph nodes noted to have lost their fatty hilum.  Biopsy of both the suspicious areas in the right breast as well as one of the suspicious lymph nodes was performed 03/27/2013 and showed ([id number]) the larger breast mass to consist of an invasive ductal carcinoma with lobular features, grade 2, estrogen and progesterone receptor both 100% positive, with an MIB-1 of 15%, and no HER-2 amplification. 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 The sampled right axillary lymph node was also positive.  Bilateral breast MRI 04/01/2013 showed the larger right breast mass to measure 5.4 cm, with a satellite nodule immediately inferior to it measuring 8 mm. 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 There were multiple enlarged right axillary lymph nodes, the largest measuring 2.8 cm. In addition, in the left breast there were 2 foci on non-masslike  enhancement consistent with DCIS measuring 1.3 cm and 2.2 cm. These weren't different quadrants.  The patient's subsequent history is as noted below  INTERVAL HISTORY: [name] [name] returns today accompanied by her daughter [name] [name] for followup of her right breast carcinoma. She is currently day 8 cycle 3 of 6 planned q. three-week doses of adjuvant docetaxel/doxorubicin/cyclophosphamide. She receives Neulasta on day 2 for granulocyte support.  With the exception of fatigue and mild nausea, [name] [name] continues to tolerate treatment well. She continues to have some problems with her rectal fissure, primarily pain with bowel movements, but no rectal bleeding.   REVIEW OF SYSTEMS: [name] [name] has had no fevers or chills. She denies any rashes, skin changes, or abnormal bleeding. Her appetite is good. She occasionally has some abdominal pain. She's had some fleeting nausea for which she takes her antinausea medication affectively. She's had no emesis and denies any change in bowel or bladder habits.  She continues to have shortness of breath with exertion which is stable. She's had no new cough or phlegm production, and denies any chest pain. She's had no abnormal headaches or dizziness. She currently denies any new or unusual myalgias, arthralgias, or bony pain. She's had no peripheral swelling. She denies any signs of numbness or tingling in the upper or lower extremities.  A detailed review of systems is otherwise stable and noncontributory.   PAST MEDICAL HISTORY: Past Medical History  Diagnosis Date  . Hypertension   . Anxiety   . Depression   . Headache(784.0)   . Sleep apnea  cpcp  . Shortness of breath   . Breast cancer     PAST SURGICAL HISTORY: Past Surgical History  Procedure Laterality Date  . Mastectomy, radical Right 04/08/2013  . Simple mastectomy with axillary sentinel node biopsy Left 04/08/2013  . Mastectomy modified radical Right 04/07/2013    Procedure: RIGHT MASTECTOMY MODIFIED RADICAL;   Surgeon: Kandis Cocking, MD;  Location: [facility] OR;  Service: General;  Laterality: Right;  . Simple mastectomy with axillary sentinel node biopsy Left 04/07/2013    Procedure:  LEFT SMPLE MASTECTOMY WITH AXILLARY SENTINEL NODE BIOPSY;  Surgeon: Kandis Cocking, MD;  Location: [facility] OR;  Service: General;  Laterality: Left;  . Portacath placement Left 04/07/2013    Procedure: INSERTION PORT-A-CATH;  Surgeon: Kandis Cocking, MD;  Location: [facility] OR;  Service: General;  Laterality: Left;    FAMILY HISTORY Family History  Problem Relation Age of Onset  . Breast cancer Mother 60  . Breast cancer Maternal Grandmother 100  . Prostate cancer Paternal Grandfather 69   the patient's father died at age 17 in an airplane crash (he was a Transport planner unsupervised). The patient's mother is alive at age 32. Her name is [name] [name] and she works for [employer]. [name] [name] has a history of breast cancer diagnosed at age 74. She had one sister who is alive and well. [name] [name]'s mother, the patient's grandmother, was diagnosed with breast cancer at age 67. She had 2 sisters, neither with breast cancer. [name] [name] herself had one brother, no sisters. There is no other history of breast or ovary cancer in the family.  GYNECOLOGIC HISTORY:  Menarche age 57, she is GX P21, first live birth age 22. The patient was taking birth control pills until September 2013. She stopped having periods at that time, but had one in late March 2014.  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 Her husband [name] [name] (goes by "[name] [name]") is disabled secondary to diabetes. They are separated, but not divorced, and remain on good terms. [name] [name] lives with her mother [name] [name] and 8 poodles. The patient's daughter [name] [name] also lives with the patient.    ADVANCED DIRECTIVES: In place. The patient has named her daughter [name] [name] as her healthcare power of attorney, bypassing her husband.  HEALTH MAINTENANCE: History   Substance Use Topics  . Smoking status: Never Smoker   . Smokeless tobacco: Never Used  . Alcohol Use: No     Colonoscopy: Never  PAP: Possibly 2006  Bone density: Possibly 2004  Lipid panel:  Allergies  Allergen Reactions  . Vicodin [Hydrocodone-Acetaminophen] Anaphylaxis  . Codeine Nausea And Vomiting  . Penicillins Rash  . Percocet [Oxycodone-Acetaminophen] Itching  . Septra [Bactrim] Other (See Comments)    Heart races    Current Outpatient Prescriptions  Medication Sig Dispense Refill  . acetaminophen (TYLENOL) 500 MG tablet Take 2 tablets (1,000 mg total) by mouth every 6 (six) hours as needed (Maximum 8 tabs in 24 hours). For pain  50 tablet  1  . ALPRAZolam (XANAX) 0.25 MG tablet Take 1 tablet by mouth Three times a day.      . ciprofloxacin (CIPRO) 500 MG tablet Take 1 tablet (500 mg total) by mouth 2 (two) times daily.  14 tablet  3  . dexamethasone (DECADRON) 4 MG tablet 2 tabs by mouth twice daily with food on day before chemo and 3 days after chemo  30 tablet  2  . diltiazem 2 % GEL  Apply 1 application topically 2 (two) times daily.  30 g  1  . docusate sodium 100 MG CAPS Take 200 mg by mouth 2 (two) times daily.  10 capsule  0  . doxycycline (VIBRA-TABS) 100 MG tablet Take 1 tablet (100 mg total) by mouth 2 (two) times daily.  14 tablet  2  . escitalopram (LEXAPRO) 10 MG tablet Take 1 tablet by mouth daily.      . fluconazole (DIFLUCAN) 100 MG tablet Take 1 tablet (100 mg total) by mouth daily.  14 tablet  1  . lidocaine-prilocaine (EMLA) cream Apply topically as needed. 1-2 hours before each chemo  30 g  1  . loratadine (CLARITIN) 10 MG tablet 1 tablet by mouth once daily for 5 days after each dose of chemo  30 tablet  1  . meloxicam (MOBIC) 15 MG tablet Take 15 mg by mouth daily.      . ondansetron (ZOFRAN) 4 MG tablet Take 4 mg by mouth every 8 (eight) hours as needed for nausea.      . prochlorperazine (COMPAZINE) 10 MG tablet 1 tab by mouth with meals and  bedtime x 3 days after chemo, then every 6 hrs as needed for nausea  30 tablet  2  . tobramycin-dexamethasone (TOBRADEX) ophthalmic solution Place 1 drop into both eyes 2 (two) times daily. For 7 days after each dose of chemo  5 mL  1  . traMADol (ULTRAM) 50 MG tablet Take 1 tablet (50 mg total) by mouth every 8 (eight) hours as needed for pain.  30 tablet  0  . triamterene-hydrochlorothiazide (MAXZIDE-25) 37.5-25 MG per tablet Take 1 tablet by mouth daily.       No current facility-administered medications for this visit.   Facility-Administered Medications Ordered in Other Visits  Medication Dose Route Frequency Provider Last Rate Last Dose  . sodium chloride 0.9 % injection 10 mL  10 mL Intracatheter PRN [name] Dulworth Allegra Grana, PA-C        OBJECTIVE:   Filed Vitals:   07/09/13 1340  BP: 116/69  Pulse: 128  Temp: 99.6 F (37.6 C)  Resp: 20     Body mass index is 36.3 kg/(m^2).    ECOG FS: 1 Filed Weights   07/09/13 1340  Weight: 198 lb 8 oz (90.039 kg)  Middle-aged white female who appears tired but is in no acute distress  Sclerae unicteric Oropharynx clear with no ulcerations or evidence of  thrush  No cervical or supraclavicular adenopathy Lungs clear to auscultation bilaterally, no rales or rhonchi, no wheezes Heart regular rate and rhythm Abd obese, soft, nontender to palpation, positive bowel sounds MSK no focal spinal tenderness Nonpitting pedal edema bilaterally, equal bilaterally; no upper extremity edema Neuro: nonfocal, well oriented, friendly affect Breasts: Deferred.  Axillae are benign bilaterally palpable adenopathy noted.    LAB RESULTS:  Lab Results  Component Value Date   WBC 2.9* 07/09/2013   NEUTROABS 0.9* 07/09/2013   HGB 11.7 07/09/2013   HCT 34.0* 07/09/2013   MCV 88.8 07/09/2013   PLT 157 07/09/2013      Chemistry      Component Value Date/Time   NA 139 07/02/2013 1117   NA 141 04/19/2013 0815   K 3.9 07/02/2013 1117   K 3.8 04/19/2013 0815   CL 103  05/20/2013 1404   CL 106 04/19/2013 0815   CO2 21* 07/02/2013 1117   CO2 24 04/19/2013 0815   BUN 10.7 07/02/2013 1117   BUN 14  04/19/2013 0815   CREATININE 1.0 07/02/2013 1117   CREATININE 0.79 04/19/2013 0815      Component Value Date/Time   CALCIUM 9.5 07/02/2013 1117   CALCIUM 8.8 04/19/2013 0815   ALKPHOS 53 07/02/2013 1117   ALKPHOS 63 04/17/2013 0741   AST 9 07/02/2013 1117   AST 23 04/17/2013 0741   ALT 8 07/02/2013 1117   ALT 26 04/17/2013 0741   BILITOT <0.20 07/02/2013 1117   BILITOT 0.2* 04/17/2013 0741       STUDIES:  Echocardiogram on 05/01/2013 showed an ejection fraction of 55-60%.    ASSESSMENT: 52 y.o. 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 There were also 2 suspicious areas in the left breast noted by MRI.  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 Patient's testing pending  (5)  started adjuvant chemotherapy, consisting of 6 planned cycles of docetaxel/ doxorubicin/ cyclophosphamide given on a Q. three-week basis, first dose  05/21/2013.    (6)  Adjuvant chemotherapy will be followed by postmastectomy radiation therapy, then antiestrogen.  (7)  Rectal fissure, stable    PLAN:  [name] [name] scheduled to return in 2 weeks on August  27 for labs, followup, and her fourth dose of adjuvant chemotherapy. In the meanwhile, she will continue using her antinausea medication as needed. We reviewed neutropenic precautions, and she will take Cipro prophylactically for the next 7 days, 500 mg by mouth twice a day. She knows to call with any fevers of 100 or above.  [name] [name] will also continue using her sitz bath and diltiazem gel for the rectal fissure.   All this was reviewed in detail with [name] [name], and she voices understanding and agreement with this plan. She will call with any changes or problems prior to her next appointment.   Zollie Scale, PA-C   07/09/2013

## 2013-07-23 ENCOUNTER — Other Ambulatory Visit: Payer: Medicaid Other | Admitting: Lab

## 2013-07-23 ENCOUNTER — Ambulatory Visit: Payer: Medicaid Other | Admitting: Physician Assistant

## 2013-07-23 ENCOUNTER — Ambulatory Visit (HOSPITAL_BASED_OUTPATIENT_CLINIC_OR_DEPARTMENT_OTHER): Payer: Medicaid Other

## 2013-07-23 ENCOUNTER — Encounter: Payer: Self-pay | Admitting: Physician Assistant

## 2013-07-23 ENCOUNTER — Ambulatory Visit (HOSPITAL_BASED_OUTPATIENT_CLINIC_OR_DEPARTMENT_OTHER): Payer: Medicaid Other | Admitting: Physician Assistant

## 2013-07-23 ENCOUNTER — Other Ambulatory Visit (HOSPITAL_BASED_OUTPATIENT_CLINIC_OR_DEPARTMENT_OTHER): Payer: Medicaid Other | Admitting: Lab

## 2013-07-23 VITALS — BP 138/85 | HR 88 | Temp 97.7°F | Resp 20 | Ht 62.0 in | Wt 194.6 lb

## 2013-07-23 DIAGNOSIS — C50419 Malignant neoplasm of upper-outer quadrant of unspecified female breast: Secondary | ICD-10-CM

## 2013-07-23 DIAGNOSIS — C773 Secondary and unspecified malignant neoplasm of axilla and upper limb lymph nodes: Secondary | ICD-10-CM

## 2013-07-23 DIAGNOSIS — C50411 Malignant neoplasm of upper-outer quadrant of right female breast: Secondary | ICD-10-CM

## 2013-07-23 DIAGNOSIS — K602 Anal fissure, unspecified: Secondary | ICD-10-CM

## 2013-07-23 DIAGNOSIS — Z5111 Encounter for antineoplastic chemotherapy: Secondary | ICD-10-CM

## 2013-07-23 DIAGNOSIS — Z170 Estrogen receptor positive status [ER+]: Secondary | ICD-10-CM

## 2013-07-23 DIAGNOSIS — Z9010 Acquired absence of unspecified breast and nipple: Secondary | ICD-10-CM

## 2013-07-23 DIAGNOSIS — D649 Anemia, unspecified: Secondary | ICD-10-CM

## 2013-07-23 LAB — CBC WITH DIFFERENTIAL/PLATELET
BASO%: 0.1 % (ref 0.0–2.0)
Eosinophils Absolute: 0 10*3/uL (ref 0.0–0.5)
MCHC: 34.2 g/dL (ref 31.5–36.0)
MONO#: 0.5 10*3/uL (ref 0.1–0.9)
NEUT#: 15.6 10*3/uL — ABNORMAL HIGH (ref 1.5–6.5)
RBC: 3.55 10*6/uL — ABNORMAL LOW (ref 3.70–5.45)
WBC: 17.4 10*3/uL — ABNORMAL HIGH (ref 3.9–10.3)
lymph#: 1.2 10*3/uL (ref 0.9–3.3)

## 2013-07-23 LAB — COMPREHENSIVE METABOLIC PANEL (CC13)
CO2: 24 mEq/L (ref 22–29)
Chloride: 101 mEq/L (ref 98–109)
Glucose: 156 mg/dl — ABNORMAL HIGH (ref 70–140)
Potassium: 3.3 mEq/L — ABNORMAL LOW (ref 3.5–5.1)
Sodium: 138 mEq/L (ref 136–145)

## 2013-07-23 MED ORDER — SODIUM CHLORIDE 0.9 % IV SOLN
500.0000 mg/m2 | Freq: Once | INTRAVENOUS | Status: AC
  Administered 2013-07-23: 1020 mg via INTRAVENOUS
  Filled 2013-07-23: qty 51

## 2013-07-23 MED ORDER — DOCETAXEL CHEMO INJECTION 160 MG/16ML
75.0000 mg/m2 | Freq: Once | INTRAVENOUS | Status: AC
  Administered 2013-07-23: 150 mg via INTRAVENOUS
  Filled 2013-07-23: qty 15

## 2013-07-23 MED ORDER — HEPARIN SOD (PORK) LOCK FLUSH 100 UNIT/ML IV SOLN
500.0000 [IU] | Freq: Once | INTRAVENOUS | Status: AC | PRN
  Administered 2013-07-23: 500 [IU]
  Filled 2013-07-23: qty 5

## 2013-07-23 MED ORDER — SODIUM CHLORIDE 0.9 % IV SOLN
Freq: Once | INTRAVENOUS | Status: AC
  Administered 2013-07-23: 11:00:00 via INTRAVENOUS

## 2013-07-23 MED ORDER — DEXAMETHASONE SODIUM PHOSPHATE 20 MG/5ML IJ SOLN
12.0000 mg | Freq: Once | INTRAMUSCULAR | Status: AC
  Administered 2013-07-23: 12 mg via INTRAVENOUS

## 2013-07-23 MED ORDER — DOXORUBICIN HCL CHEMO IV INJECTION 2 MG/ML
50.0000 mg/m2 | Freq: Once | INTRAVENOUS | Status: AC
  Administered 2013-07-23: 102 mg via INTRAVENOUS
  Filled 2013-07-23: qty 51

## 2013-07-23 MED ORDER — PALONOSETRON HCL INJECTION 0.25 MG/5ML
0.2500 mg | Freq: Once | INTRAVENOUS | Status: AC
  Administered 2013-07-23: 0.25 mg via INTRAVENOUS

## 2013-07-23 MED ORDER — SODIUM CHLORIDE 0.9 % IV SOLN
150.0000 mg | Freq: Once | INTRAVENOUS | Status: AC
  Administered 2013-07-23: 150 mg via INTRAVENOUS
  Filled 2013-07-23: qty 5

## 2013-07-23 MED ORDER — SODIUM CHLORIDE 0.9 % IJ SOLN
10.0000 mL | INTRAMUSCULAR | Status: DC | PRN
  Administered 2013-07-23: 10 mL
  Filled 2013-07-23: qty 10

## 2013-07-23 NOTE — Progress Notes (Signed)
ID: [name] OB: 1961-10-16  MR#: [id number]  CSN#:[id number]  PCP: Raliegh Ip, MD GYN:   SU: Ovidio Kin OTHER MD: Chipper Herb;  Romie Levee;  Dorene Grebe   HISTORY OF PRESENT ILLNESS: Chontel tells me she has not been able to have mammography for several years because of cost issues. More recently, she had noted a mass in her right breast but thought this might be MRSA. The urinary tract infection took her to her physician's office, and she brought the breast mass to the attention of [name]. She set up the patient for bilateral diagnostic mammography and right breast ultrasound at the [facility] 03/27/2013, which showed a 4 cm irregular mass in the upper outer right breast, with a 9 mm cluster of calcifications in the posterior lower right breast and a few mildly enlarged level I right axillary lymph nodes. The breast mass was palpable and ultrasound showed it to measure 4.1 cm, with some of the level I right axillary lymph nodes noted to have lost their fatty hilum.  Biopsy of both the suspicious areas in the right breast as well as one of the suspicious lymph nodes was performed 03/27/2013 and showed ([id number]) the larger breast mass to consist of an invasive ductal carcinoma with lobular features, grade 2, estrogen and progesterone receptor both 100% positive, with an MIB-1 of 15%, and no HER-2 amplification. 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 The sampled right axillary lymph node was also positive.  Bilateral breast MRI 04/01/2013 showed the larger right breast mass to measure 5.4 cm, with a satellite nodule immediately inferior to it measuring 8 mm. 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 There were multiple enlarged right axillary lymph nodes, the largest measuring 2.8 cm. In addition, in the left breast there were 2 foci on non-masslike  enhancement consistent with DCIS measuring 1.3 cm and 2.2 cm. These weren't different quadrants.  The patient's subsequent history is as noted below  INTERVAL HISTORY: Hallel returns today accompanied by her daughter [name] for followup of her right breast carcinoma. She is due for day 1 cycle 4 of 6 planned q. three-week doses of adjuvant docetaxel/doxorubicin/cyclophosphamide. She receives Neulasta on day 2 for granulocyte support.  Yajaira tells me the past 4-5 days has been "great" and she has no new complaints today. She feels well recovered from cycle 3, and is ready for treatment today as planned. She's eating and drinking well. She's had no problems this past week with her rectal fissure, has had no significant rectal pain, and no rectal bleeding. She tells me her bowels are moving regularly as long as she "eats plenty of watermelon and corn".   REVIEW OF SYSTEMS: Jamirra has had no fevers or chills. She denies any rashes, skin changes, or abnormal bleeding. She denies in problems with nausea or emesis.  She's had no mouth ulcers or oral sensitivity. She continues to have shortness of breath with exertion which is stable. She's had no new cough or phlegm production, and denies any chest pain. She's had no abnormal headaches or dizziness. She currently denies any new or unusual myalgias, arthralgias, or bony pain. She's had no peripheral swelling. She denies any signs of numbness or tingling in the upper or lower extremities.  A detailed review of systems is otherwise stable and noncontributory.   PAST MEDICAL HISTORY: Past Medical History  Diagnosis  Date  . Hypertension   . Anxiety   . Depression   . Headache(784.0)   . Sleep apnea     cpcp  . Shortness of breath   . Breast cancer     PAST SURGICAL HISTORY: Past Surgical History  Procedure Laterality Date  . Mastectomy, radical Right 04/08/2013  . Simple mastectomy with axillary sentinel node biopsy Left 04/08/2013  . Mastectomy  modified radical Right 04/07/2013    Procedure: RIGHT MASTECTOMY MODIFIED RADICAL;  Surgeon: Kandis Cocking, MD;  Location: [facility] OR;  Service: General;  Laterality: Right;  . Simple mastectomy with axillary sentinel node biopsy Left 04/07/2013    Procedure:  LEFT SMPLE MASTECTOMY WITH AXILLARY SENTINEL NODE BIOPSY;  Surgeon: Kandis Cocking, MD;  Location: [facility] OR;  Service: General;  Laterality: Left;  . Portacath placement Left 04/07/2013    Procedure: INSERTION PORT-A-CATH;  Surgeon: Kandis Cocking, MD;  Location: [facility] OR;  Service: General;  Laterality: Left;    FAMILY HISTORY Family History  Problem Relation Age of Onset  . Breast cancer Mother 69  . Breast cancer Maternal Grandmother 89  . Prostate cancer Paternal Grandfather 34   the patient's father died at age 45 in an airplane crash (he was a Transport planner unsupervised). The patient's mother is alive at age 57. Her name is [name] and she works for [employer]. [name] has a history of breast cancer diagnosed at age 12. She had one sister who is alive and well. [name]'s mother, the patient's grandmother, was diagnosed with breast cancer at age 62. She had 2 sisters, neither with breast cancer. Kathren herself had one brother, no sisters. There is no other history of breast or ovary cancer in the family.  GYNECOLOGIC HISTORY:  Menarche age 41, she is GX P61, first live birth age 58. The patient was taking birth control pills until September 2013. She stopped having periods at that time, but had one in late March 2014.  SOCIAL HISTORY:  Arynn worked as a Sales executive for 30 years. Her husband Chaka Boyson (goes by "[name]") is disabled secondary to diabetes. They were separated for the past 2 years, but [name] recently moved back in with [place]. Elleigh lives with her mother [name] and 8 poodles. The patient's daughter [name] "[name]" Sheets is 37 and also lives with the patient.    ADVANCED DIRECTIVES: In place. The patient has named  her daughter [name] as her healthcare power of attorney, bypassing her husband.  HEALTH MAINTENANCE: History  Substance Use Topics  . Smoking status: Never Smoker   . Smokeless tobacco: Never Used  . Alcohol Use: No     Colonoscopy: Never  PAP: Possibly 2006  Bone density: Possibly 2004  Lipid panel:  Allergies  Allergen Reactions  . Vicodin [Hydrocodone-Acetaminophen] Anaphylaxis  . Codeine Nausea And Vomiting  . Penicillins Rash  . Percocet [Oxycodone-Acetaminophen] Itching  . Septra [Bactrim] Other (See Comments)    Heart races    Current Outpatient Prescriptions  Medication Sig Dispense Refill  . acetaminophen (TYLENOL) 500 MG tablet Take 2 tablets (1,000 mg total) by mouth every 6 (six) hours as needed (Maximum 8 tabs in 24 hours). For pain  50 tablet  1  . ALPRAZolam (XANAX) 0.25 MG tablet Take 1 tablet by mouth Three times a day.      . ciprofloxacin (CIPRO) 500 MG tablet Take 1 tablet (500 mg total) by mouth 2 (two) times daily.  14 tablet  3  .  dexamethasone (DECADRON) 4 MG tablet 2 tabs by mouth twice daily with food on day before chemo and 3 days after chemo  30 tablet  2  . diltiazem 2 % GEL Apply 1 application topically 2 (two) times daily.  30 g  1  . docusate sodium 100 MG CAPS Take 200 mg by mouth 2 (two) times daily.  10 capsule  0  . doxycycline (VIBRA-TABS) 100 MG tablet Take 1 tablet (100 mg total) by mouth 2 (two) times daily.  14 tablet  2  . escitalopram (LEXAPRO) 10 MG tablet Take 1 tablet by mouth daily.      Marland Kitchen lidocaine-prilocaine (EMLA) cream Apply topically as needed. 1-2 hours before each chemo  30 g  1  . loratadine (CLARITIN) 10 MG tablet 1 tablet by mouth once daily for 5 days after each dose of chemo  30 tablet  1  . meloxicam (MOBIC) 15 MG tablet Take 15 mg by mouth daily.      . ondansetron (ZOFRAN) 4 MG tablet Take 4 mg by mouth every 8 (eight) hours as needed for nausea.      . prochlorperazine (COMPAZINE) 10 MG tablet 1 tab by mouth with  meals and bedtime x 3 days after chemo, then every 6 hrs as needed for nausea  30 tablet  2  . tobramycin-dexamethasone (TOBRADEX) ophthalmic solution Place 1 drop into both eyes 2 (two) times daily. For 7 days after each dose of chemo  5 mL  1  . traMADol (ULTRAM) 50 MG tablet Take 1 tablet (50 mg total) by mouth every 8 (eight) hours as needed for pain.  30 tablet  0  . triamterene-hydrochlorothiazide (MAXZIDE-25) 37.5-25 MG per tablet Take 1 tablet by mouth daily.       No current facility-administered medications for this visit.   Facility-Administered Medications Ordered in Other Visits  Medication Dose Route Frequency Provider Last Rate Last Dose  . sodium chloride 0.9 % injection 10 mL  10 mL Intracatheter PRN Alyene Predmore Allegra Grana, PA-C        OBJECTIVE:   Filed Vitals:   07/23/13 0919  BP: 138/85  Pulse: 88  Temp: 97.7 F (36.5 C)  Resp: 20     Body mass index is 35.58 kg/(m^2).    ECOG FS: 1 Filed Weights   07/23/13 0919  Weight: 194 lb 9.6 oz (88.27 kg)  Middle-aged white female who appears comfortable and is in no acute distress  Sclerae unicteric Oropharynx clear with no ulcerations or evidence of  thrush  No cervical or supraclavicular adenopathy Lungs clear to auscultation bilaterally, no rales or rhonchi, no wheezes Heart regular rate and rhythm Abd obese, soft, nontender to palpation, positive bowel sounds MSK no focal spinal tenderness to palpation Nonpitting pedal edema bilaterally, equal bilaterally; no upper extremity edema Neuro: nonfocal, well oriented, friendly affect Breasts: Deferred.  Axillae are benign bilaterally, no palpable adenopathy noted.    LAB RESULTS:  Lab Results  Component Value Date   WBC 17.4* 07/23/2013   NEUTROABS 15.6* 07/23/2013   HGB 10.7* 07/23/2013   HCT 31.3* 07/23/2013   MCV 88.2 07/23/2013   PLT 437* 07/23/2013      Chemistry      Component Value Date/Time   NA 138 07/23/2013 0902   NA 141 04/19/2013 0815   K 3.3* 07/23/2013 0902    K 3.8 04/19/2013 0815   CL 103 05/20/2013 1404   CL 106 04/19/2013 0815   CO2 24 07/23/2013 0902  CO2 24 04/19/2013 0815   BUN 12.8 07/23/2013 0902   BUN 14 04/19/2013 0815   CREATININE 1.0 07/23/2013 0902   CREATININE 0.79 04/19/2013 0815      Component Value Date/Time   CALCIUM 9.6 07/23/2013 0902   CALCIUM 8.8 04/19/2013 0815   ALKPHOS 57 07/23/2013 0902   ALKPHOS 63 04/17/2013 0741   AST 10 07/23/2013 0902   AST 23 04/17/2013 0741   ALT 20 07/23/2013 0902   ALT 26 04/17/2013 0741   BILITOT 0.22 07/23/2013 0902   BILITOT 0.2* 04/17/2013 0741       STUDIES:  Echocardiogram on 05/01/2013 showed an ejection fraction of 55-60%.    ASSESSMENT: 52 y.o. 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 There were also 2 suspicious areas in the left breast noted by MRI.  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 Patient's testing pending  (5)  started adjuvant chemotherapy, consisting of 6 planned cycles of docetaxel/ doxorubicin/ cyclophosphamide given on a Q. three-week basis, first dose  05/21/2013.    (6)  Adjuvant chemotherapy will be followed by postmastectomy radiation therapy, then antiestrogen.  (7)  Rectal fissure, stable    PLAN:   Liyla proceed to treatment today as scheduled for day 1 cycle 4 of docetaxel/doxorubicin/cyclophosphamide and will return tomorrow for Neulasta on day 2. She'll see Dr. Darnelle Catalan next week for assessment of chemotoxicity on September 2. In the meanwhile, Katura will continue using her sitz bath and diltiazem gel for the rectal fissure which seems to be improving her report.   All this was reviewed in detail with Trissa and her daughter [name], and they voiced understanding and agreement with this plan. She will call with any changes or problems prior to her next appointment.   Zollie Scale, PA-C   07/23/2013

## 2013-07-23 NOTE — Patient Instructions (Addendum)
[facility] Discharge Instructions for Patients Receiving Chemotherapy  Today you received the following chemotherapy agents Adriamycin/Cytoxan/Taxotere.  To help prevent nausea and vomiting after your treatment, we encourage you to take your nausea medication as prescribed.   If you develop nausea and vomiting that is not controlled by your nausea medication, call the clinic.   BELOW ARE SYMPTOMS THAT SHOULD BE REPORTED IMMEDIATELY:  *FEVER GREATER THAN 100.5 F  *CHILLS WITH OR WITHOUT FEVER  NAUSEA AND VOMITING THAT IS NOT CONTROLLED WITH YOUR NAUSEA MEDICATION  *UNUSUAL SHORTNESS OF BREATH  *UNUSUAL BRUISING OR BLEEDING  TENDERNESS IN MOUTH AND THROAT WITH OR WITHOUT PRESENCE OF ULCERS  *URINARY PROBLEMS  *BOWEL PROBLEMS  UNUSUAL RASH Items with * indicate a potential emergency and should be followed up as soon as possible.  Feel free to call the clinic you have any questions or concerns. The clinic phone number is [phone].    

## 2013-07-24 ENCOUNTER — Ambulatory Visit (HOSPITAL_BASED_OUTPATIENT_CLINIC_OR_DEPARTMENT_OTHER): Payer: Medicaid Other

## 2013-07-24 VITALS — BP 157/67 | HR 69 | Temp 98.0°F

## 2013-07-24 DIAGNOSIS — C50419 Malignant neoplasm of upper-outer quadrant of unspecified female breast: Secondary | ICD-10-CM

## 2013-07-24 DIAGNOSIS — Z5189 Encounter for other specified aftercare: Secondary | ICD-10-CM

## 2013-07-24 DIAGNOSIS — C50411 Malignant neoplasm of upper-outer quadrant of right female breast: Secondary | ICD-10-CM

## 2013-07-24 MED ORDER — PEGFILGRASTIM INJECTION 6 MG/0.6ML
6.0000 mg | Freq: Once | SUBCUTANEOUS | Status: AC
  Administered 2013-07-24: 6 mg via SUBCUTANEOUS
  Filled 2013-07-24: qty 0.6

## 2013-07-25 ENCOUNTER — Telehealth: Payer: Self-pay | Admitting: *Deleted

## 2013-07-25 NOTE — Telephone Encounter (Signed)
Per staff message and POF I have scheduled appts.  JMW  

## 2013-07-29 ENCOUNTER — Other Ambulatory Visit (HOSPITAL_BASED_OUTPATIENT_CLINIC_OR_DEPARTMENT_OTHER): Payer: Medicaid Other | Admitting: Lab

## 2013-07-29 ENCOUNTER — Ambulatory Visit (HOSPITAL_BASED_OUTPATIENT_CLINIC_OR_DEPARTMENT_OTHER): Payer: Medicaid Other | Admitting: Oncology

## 2013-07-29 ENCOUNTER — Other Ambulatory Visit: Payer: Self-pay | Admitting: *Deleted

## 2013-07-29 VITALS — BP 136/93 | HR 90 | Temp 98.6°F | Resp 20 | Ht 62.0 in | Wt 195.9 lb

## 2013-07-29 DIAGNOSIS — D709 Neutropenia, unspecified: Secondary | ICD-10-CM

## 2013-07-29 DIAGNOSIS — C773 Secondary and unspecified malignant neoplasm of axilla and upper limb lymph nodes: Secondary | ICD-10-CM

## 2013-07-29 DIAGNOSIS — C50411 Malignant neoplasm of upper-outer quadrant of right female breast: Secondary | ICD-10-CM

## 2013-07-29 DIAGNOSIS — D702 Other drug-induced agranulocytosis: Secondary | ICD-10-CM

## 2013-07-29 DIAGNOSIS — C50419 Malignant neoplasm of upper-outer quadrant of unspecified female breast: Secondary | ICD-10-CM

## 2013-07-29 DIAGNOSIS — Z170 Estrogen receptor positive status [ER+]: Secondary | ICD-10-CM

## 2013-07-29 LAB — CBC WITH DIFFERENTIAL/PLATELET
Basophils Absolute: 0 10*3/uL (ref 0.0–0.1)
EOS%: 1.1 % (ref 0.0–7.0)
HCT: 30.3 % — ABNORMAL LOW (ref 34.8–46.6)
HGB: 10.3 g/dL — ABNORMAL LOW (ref 11.6–15.9)
MCH: 30.5 pg (ref 25.1–34.0)
MONO#: 0 10*3/uL — ABNORMAL LOW (ref 0.1–0.9)
NEUT%: 13.9 % — ABNORMAL LOW (ref 38.4–76.8)
Platelets: 169 10*3/uL (ref 145–400)
lymph#: 0.7 10*3/uL — ABNORMAL LOW (ref 0.9–3.3)

## 2013-07-29 LAB — COMPREHENSIVE METABOLIC PANEL (CC13)
AST: 8 U/L (ref 5–34)
Alkaline Phosphatase: 55 U/L (ref 40–150)
BUN: 15.8 mg/dL (ref 7.0–26.0)
Creatinine: 0.8 mg/dL (ref 0.6–1.1)
Glucose: 115 mg/dl (ref 70–140)
Potassium: 3.5 mEq/L (ref 3.5–5.1)
Total Bilirubin: 0.83 mg/dL (ref 0.20–1.20)

## 2013-07-29 MED ORDER — CIPROFLOXACIN HCL 500 MG PO TABS: 500.0000 mg | ORAL_TABLET | Freq: Two times a day (BID) | ORAL | Status: DC

## 2013-07-29 NOTE — Progress Notes (Signed)
Patient ID: [name] [name], female   DOB: 06/21/1961, 52 y.o.   MRN: [id number] ID: [name] OB: 02-14-1961  MR#: [id number]  NWG#:[id number]  PCP: Raliegh Ip, MD GYN:   SU: Ovidio Kin OTHER MD: Chipper Herb;  Romie Levee;  Dorene Grebe   HISTORY OF PRESENT ILLNESS: [name] [name] tells me she has not been able to have mammography for several years because of cost issues. More recently, she had noted a mass in her right breast but thought this might be MRSA. The urinary tract infection took her to her physician's office, and she brought the breast mass to the attention of [name]. She set up the patient for bilateral diagnostic mammography and right breast ultrasound at the [facility] 03/27/2013, which showed a 4 cm irregular mass in the upper outer right breast, with a 9 mm cluster of calcifications in the posterior lower right breast and a few mildly enlarged level I right axillary lymph nodes. The breast mass was palpable and ultrasound showed it to measure 4.1 cm, with some of the level I right axillary lymph nodes noted to have lost their fatty hilum.  Biopsy of both the suspicious areas in the right breast as well as one of the suspicious lymph nodes was performed 03/27/2013 and showed ([id number]) the larger breast mass to consist of an invasive ductal carcinoma with lobular features, grade 2, estrogen and progesterone receptor both 100% positive, with an MIB-1 of 15%, and no HER-2 amplification. 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 The sampled right axillary lymph node was also positive.  Bilateral breast MRI 04/01/2013 showed the larger right breast mass to measure 5.4 cm, with a satellite nodule immediately inferior to it measuring 8 mm. 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 There were multiple enlarged right axillary lymph nodes, the largest measuring 2.8  cm. In addition, in the left breast there were 2 foci on non-masslike enhancement consistent with DCIS measuring 1.3 cm and 2.2 cm. These weren't different quadrants.  The patient's subsequent history is as noted below  INTERVAL HISTORY: [name] [name] returns today accompanied by her daughter [name] Bonito for followup of her right breast carcinoma. Today is day 7 cycle 4 of 6 planned q. three-week doses of adjuvant docetaxel/doxorubicin/cyclophosphamide. She receives Neulasta on day 2 for granulocyte support.  REVIEW OF SYSTEMS: [name] does pretty well the first 4 days, but as soon as she stops the dexamethasone she crashes. She feels "awful" as all sorts of symptoms including fatigue, chills, aches and pains, runny nose, sore throat, cough, stuffed years, nausea, forgetfulness, anxiety, and depression. In addition she has a fissure in her stools have been hard lately, causing her some discomfort and a little bit of blood on the tissue. She has had no fevers, no headaches, no dysuria or hematuria, no rash, no palpitations, and no peripheral neuropathy. A detailed review of systems today was otherwise stable.  PAST MEDICAL HISTORY: Past Medical History  Diagnosis Date  . Hypertension   . Anxiety   . Depression   . Headache(784.0)   . Sleep apnea     cpcp  . Shortness of breath   . Breast cancer     PAST SURGICAL HISTORY: Past Surgical History  Procedure Laterality Date  . Mastectomy, radical Right 04/08/2013  . Simple mastectomy with axillary sentinel node biopsy Left 04/08/2013  . Mastectomy modified radical Right 04/07/2013  Procedure: RIGHT MASTECTOMY MODIFIED RADICAL;  Surgeon: Kandis Cocking, MD;  Location: [facility];  Service: General;  Laterality: Right;  . Simple mastectomy with axillary sentinel node biopsy Left 04/07/2013    Procedure:  LEFT SMPLE MASTECTOMY WITH AXILLARY SENTINEL NODE BIOPSY;  Surgeon: Kandis Cocking, MD;  Location: [facility] OR;  Service: General;  Laterality: Left;  . Portacath  placement Left 04/07/2013    Procedure: INSERTION PORT-A-CATH;  Surgeon: Kandis Cocking, MD;  Location: [facility] OR;  Service: General;  Laterality: Left;    FAMILY HISTORY Family History  Problem Relation Age of Onset  . Breast cancer Mother 28  . Breast cancer Maternal Grandmother 80  . Prostate cancer Paternal Grandfather 67   the patient's father died at age 57 in an airplane crash (he was a Transport planner unsupervised). The patient's mother is alive at age 9. Her name is [name] [name] and she works for [employer]. [name] [name] has a history of breast cancer diagnosed at age 66. She had one sister who is alive and well. [name]'s mother, the patient's grandmother, was diagnosed with breast cancer at age 39. She had 2 sisters, neither with breast cancer. [name] herself had one brother, no sisters. There is no other history of breast or ovary cancer in the family.  GYNECOLOGIC HISTORY:  Menarche age 34, she is GX P23, first live birth age 53. The patient was taking birth control pills until September 2013. She stopped having periods at that time, but had one in late March 2014.  SOCIAL HISTORY:  Rishita worked as a Sales executive for 30 years. Her husband Abria Vannostrand (goes by "[name]") is disabled secondary to diabetes. They were separated for the past 2 years, but [name] recently moved back in with [place]. Deisi lives with her mother [name] [name] and 8 poodles. The patient's daughter [name] [name] "[name] Bonito" [name] is 36 and also lives with the patient.    ADVANCED DIRECTIVES: In place. The patient has named her daughter [name] Bonito as her healthcare power of attorney, bypassing her husband.  HEALTH MAINTENANCE: History  Substance Use Topics  . Smoking status: Never Smoker   . Smokeless tobacco: Never Used  . Alcohol Use: No     Colonoscopy: Never  PAP: Possibly 2006  Bone density: Possibly 2004  Lipid panel:  Allergies  Allergen Reactions  . Vicodin [Hydrocodone-Acetaminophen] Anaphylaxis  . Codeine  Nausea And Vomiting  . Penicillins Rash  . Percocet [Oxycodone-Acetaminophen] Itching  . Septra [Bactrim] Other (See Comments)    Heart races    Current Outpatient Prescriptions  Medication Sig Dispense Refill  . acetaminophen (TYLENOL) 500 MG tablet Take 2 tablets (1,000 mg total) by mouth every 6 (six) hours as needed (Maximum 8 tabs in 24 hours). For pain  50 tablet  1  . ALPRAZolam (XANAX) 0.25 MG tablet Take 1 tablet by mouth Three times a day.      . ciprofloxacin (CIPRO) 500 MG tablet Take 1 tablet (500 mg total) by mouth 2 (two) times daily.  14 tablet  3  . dexamethasone (DECADRON) 4 MG tablet 2 tabs by mouth twice daily with food on day before chemo and 3 days after chemo  30 tablet  2  . diltiazem 2 % GEL Apply 1 application topically 2 (two) times daily.  30 g  1  . docusate sodium 100 MG CAPS Take 200 mg by mouth 2 (two) times daily.  10 capsule  0  . doxycycline (VIBRA-TABS) 100 MG tablet Take 1 tablet (  100 mg total) by mouth 2 (two) times daily.  14 tablet  2  . escitalopram (LEXAPRO) 10 MG tablet Take 1 tablet by mouth daily.      Marland Kitchen lidocaine-prilocaine (EMLA) cream Apply topically as needed. 1-2 hours before each chemo  30 g  1  . loratadine (CLARITIN) 10 MG tablet 1 tablet by mouth once daily for 5 days after each dose of chemo  30 tablet  1  . meloxicam (MOBIC) 15 MG tablet Take 15 mg by mouth daily.      . ondansetron (ZOFRAN) 4 MG tablet Take 4 mg by mouth every 8 (eight) hours as needed for nausea.      . prochlorperazine (COMPAZINE) 10 MG tablet 1 tab by mouth with meals and bedtime x 3 days after chemo, then every 6 hrs as needed for nausea  30 tablet  2  . tobramycin-dexamethasone (TOBRADEX) ophthalmic solution Place 1 drop into both eyes 2 (two) times daily. For 7 days after each dose of chemo  5 mL  1  . traMADol (ULTRAM) 50 MG tablet Take 1 tablet (50 mg total) by mouth every 8 (eight) hours as needed for pain.  30 tablet  0  . triamterene-hydrochlorothiazide  (MAXZIDE-25) 37.5-25 MG per tablet Take 1 tablet by mouth daily.       No current facility-administered medications for this visit.   Facility-Administered Medications Ordered in Other Visits  Medication Dose Route Frequency Provider Last Rate Last Dose  . sodium chloride 0.9 % injection 10 mL  10 mL Intracatheter PRN Amy Allegra Grana, PA-C        OBJECTIVE:    Filed Vitals:   07/29/13 1010  BP: 136/93  Pulse: 90  Temp: 98.6 F (37 C)  Resp: 20     Body mass index is 35.82 kg/(m^2).    ECOG FS: 2 Filed Weights   07/29/13 1010  Weight: 88.86 kg (195 lb 14.4 oz)    Sclerae unicteric; some wax in either canals bilaterally, with mild irritation in the external ear likely secondary to Q-tip trauma Oropharynx clear  No cervical or supraclavicular adenopathy Lungs clear to auscultation bilaterally, fair excursion bilaterally Heart regular rate and rhythm Abd obese, soft, nontender, positive bowel sounds MSK no focal spinal tenderness  No upper extremity edema Neuro: nonfocal, well oriented, fatigued affect Breasts: Deferred.     LAB RESULTS:  Lab Results  Component Value Date   WBC 0.9* 07/29/2013   NEUTROABS 0.1* 07/29/2013   HGB 10.3* 07/29/2013   HCT 30.3* 07/29/2013   MCV 89.6 07/29/2013   PLT 169 07/29/2013      Chemistry      Component Value Date/Time   NA 138 07/29/2013 0944   NA 141 04/19/2013 0815   K 3.5 07/29/2013 0944   K 3.8 04/19/2013 0815   CL 103 05/20/2013 1404   CL 106 04/19/2013 0815   CO2 27 07/29/2013 0944   CO2 24 04/19/2013 0815   BUN 15.8 07/29/2013 0944   BUN 14 04/19/2013 0815   CREATININE 0.8 07/29/2013 0944   CREATININE 0.79 04/19/2013 0815      Component Value Date/Time   CALCIUM 9.2 07/29/2013 0944   CALCIUM 8.8 04/19/2013 0815   ALKPHOS 55 07/29/2013 0944   ALKPHOS 63 04/17/2013 0741   AST 8 07/29/2013 0944   AST 23 04/17/2013 0741   ALT 10 07/29/2013 0944   ALT 26 04/17/2013 0741   BILITOT 0.83 07/29/2013 0944   BILITOT 0.2* 04/17/2013 0741  STUDIES:  Echocardiogram on 05/01/2013 showed an ejection fraction of 55-60%.  ASSESSMENT: 52 y.o. 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 There were also 2 suspicious areas in the left breast noted by MRI.  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 Patient's testing pending  (5)  started adjuvant chemotherapy, consisting of 6 planned cycles of docetaxel/ doxorubicin/ cyclophosphamide given on a Q. three-week basis, first dose  05/21/2013.    (6)  Adjuvant chemotherapy will be followed by postmastectomy radiation therapy, then antiestrogen.  (7)  Rectal fissure, stable   PLAN:  Trinidi is going to take ciprofloxacin twice a day for the next 5 days, by which time I expect her neutropenia will have resolved. I have strongly her sugar to start stool softeners, 2 tablets twice daily, or whatever amount it takes to make sure her bowel movements are soft. Otherwise she will make her rectal fissure worse.  The biggest problem though is the crash after she finishes steroids. I am suggested she take 2 mg of dexamethasone the morning of days 56 and 7 after each treatment. I think that will help her get through her last 2 treatments  successfully.  She understands she must call us if she has a temperature of 100 or greater. Otherwise she is already scheduled to see [place] againSeptember 17 for her fifth of 6 planned treatments.     Lowella Dell, MD   07/29/2013

## 2013-08-13 ENCOUNTER — Ambulatory Visit (HOSPITAL_BASED_OUTPATIENT_CLINIC_OR_DEPARTMENT_OTHER): Payer: Medicaid Other | Admitting: Physician Assistant

## 2013-08-13 ENCOUNTER — Ambulatory Visit (HOSPITAL_BASED_OUTPATIENT_CLINIC_OR_DEPARTMENT_OTHER): Payer: Medicaid Other

## 2013-08-13 ENCOUNTER — Other Ambulatory Visit (HOSPITAL_BASED_OUTPATIENT_CLINIC_OR_DEPARTMENT_OTHER): Payer: Medicaid Other | Admitting: Lab

## 2013-08-13 ENCOUNTER — Encounter: Payer: Self-pay | Admitting: Physician Assistant

## 2013-08-13 VITALS — BP 133/81 | HR 81 | Temp 97.8°F | Resp 18 | Ht 62.0 in | Wt 206.3 lb

## 2013-08-13 DIAGNOSIS — C50419 Malignant neoplasm of upper-outer quadrant of unspecified female breast: Secondary | ICD-10-CM

## 2013-08-13 DIAGNOSIS — D72829 Elevated white blood cell count, unspecified: Secondary | ICD-10-CM

## 2013-08-13 DIAGNOSIS — K219 Gastro-esophageal reflux disease without esophagitis: Secondary | ICD-10-CM

## 2013-08-13 DIAGNOSIS — C50411 Malignant neoplasm of upper-outer quadrant of right female breast: Secondary | ICD-10-CM

## 2013-08-13 DIAGNOSIS — D649 Anemia, unspecified: Secondary | ICD-10-CM

## 2013-08-13 DIAGNOSIS — C773 Secondary and unspecified malignant neoplasm of axilla and upper limb lymph nodes: Secondary | ICD-10-CM

## 2013-08-13 DIAGNOSIS — Z5111 Encounter for antineoplastic chemotherapy: Secondary | ICD-10-CM

## 2013-08-13 DIAGNOSIS — K602 Anal fissure, unspecified: Secondary | ICD-10-CM

## 2013-08-13 LAB — CBC WITH DIFFERENTIAL/PLATELET
Basophils Absolute: 0 10*3/uL (ref 0.0–0.1)
EOS%: 0 % (ref 0.0–7.0)
Eosinophils Absolute: 0 10*3/uL (ref 0.0–0.5)
HCT: 27.1 % — ABNORMAL LOW (ref 34.8–46.6)
HGB: 9.2 g/dL — ABNORMAL LOW (ref 11.6–15.9)
MCH: 32 pg (ref 25.1–34.0)
MCV: 94.6 fL (ref 79.5–101.0)
MONO%: 3 % (ref 0.0–14.0)
NEUT#: 18.3 10*3/uL — ABNORMAL HIGH (ref 1.5–6.5)
NEUT%: 91.7 % — ABNORMAL HIGH (ref 38.4–76.8)
Platelets: 447 10*3/uL — ABNORMAL HIGH (ref 145–400)
RDW: 17.8 % — ABNORMAL HIGH (ref 11.2–14.5)

## 2013-08-13 MED ORDER — DOXORUBICIN HCL CHEMO IV INJECTION 2 MG/ML
50.0000 mg/m2 | Freq: Once | INTRAVENOUS | Status: AC
  Administered 2013-08-13: 102 mg via INTRAVENOUS
  Filled 2013-08-13: qty 51

## 2013-08-13 MED ORDER — SODIUM CHLORIDE 0.9 % IJ SOLN
10.0000 mL | INTRAMUSCULAR | Status: DC | PRN
  Administered 2013-08-13: 10 mL
  Filled 2013-08-13: qty 10

## 2013-08-13 MED ORDER — DEXAMETHASONE SODIUM PHOSPHATE 20 MG/5ML IJ SOLN
12.0000 mg | Freq: Once | INTRAMUSCULAR | Status: AC
  Administered 2013-08-13: 12 mg via INTRAVENOUS

## 2013-08-13 MED ORDER — HEPARIN SOD (PORK) LOCK FLUSH 100 UNIT/ML IV SOLN
500.0000 [IU] | Freq: Once | INTRAVENOUS | Status: AC | PRN
  Administered 2013-08-13: 500 [IU]
  Filled 2013-08-13: qty 5

## 2013-08-13 MED ORDER — SODIUM CHLORIDE 0.9 % IV SOLN
500.0000 mg/m2 | Freq: Once | INTRAVENOUS | Status: AC
  Administered 2013-08-13: 1020 mg via INTRAVENOUS
  Filled 2013-08-13: qty 51

## 2013-08-13 MED ORDER — PALONOSETRON HCL INJECTION 0.25 MG/5ML
INTRAVENOUS | Status: AC
  Filled 2013-08-13: qty 5

## 2013-08-13 MED ORDER — PALONOSETRON HCL INJECTION 0.25 MG/5ML
0.2500 mg | Freq: Once | INTRAVENOUS | Status: AC
  Administered 2013-08-13: 0.25 mg via INTRAVENOUS

## 2013-08-13 MED ORDER — SODIUM CHLORIDE 0.9 % IV SOLN
Freq: Once | INTRAVENOUS | Status: AC
  Administered 2013-08-13: 14:00:00 via INTRAVENOUS

## 2013-08-13 MED ORDER — DEXAMETHASONE SODIUM PHOSPHATE 20 MG/5ML IJ SOLN
INTRAMUSCULAR | Status: AC
  Filled 2013-08-13: qty 5

## 2013-08-13 MED ORDER — DOCETAXEL CHEMO INJECTION 160 MG/16ML
75.0000 mg/m2 | Freq: Once | INTRAVENOUS | Status: AC
  Administered 2013-08-13: 150 mg via INTRAVENOUS
  Filled 2013-08-13: qty 15

## 2013-08-13 MED ORDER — OMEPRAZOLE 20 MG PO CPDR: 20.0000 mg | DELAYED_RELEASE_CAPSULE | Freq: Two times a day (BID) | ORAL | Status: DC

## 2013-08-13 MED ORDER — SODIUM CHLORIDE 0.9 % IV SOLN
150.0000 mg | Freq: Once | INTRAVENOUS | Status: AC
  Administered 2013-08-13: 150 mg via INTRAVENOUS
  Filled 2013-08-13: qty 5

## 2013-08-13 NOTE — Progress Notes (Signed)
Patient ID: [name] [name], female   DOB: 10-02-1961, 52 y.o.   MRN: [id number] ID: [name] [name] OB: Aug 31, 1961  MR#: [id number]  YQM#:[id number]  PCP: [name] Ip, MD GYN:   SU: [name] [name] OTHER MD: [name] [name];  [name] [name];  [name] [name]   HISTORY OF PRESENT ILLNESS: [name] [name] tells me she has not been able to have mammography for several years because of cost issues. More recently, she had noted a mass in her right breast but thought this might be MRSA. The urinary tract infection took her to her physician's office, and she brought the breast mass to the attention of [name] [name]. She set up the patient for bilateral diagnostic mammography and right breast ultrasound at the [facility] 03/27/2013, which showed a 4 cm irregular mass in the upper outer right breast, with a 9 mm cluster of calcifications in the posterior lower right breast and a few mildly enlarged level I right axillary lymph nodes. The breast mass was palpable and ultrasound showed it to measure 4.1 cm, with some of the level I right axillary lymph nodes noted to have lost their fatty hilum.  Biopsy of both the suspicious areas in the right breast as well as one of the suspicious lymph nodes was performed 03/27/2013 and showed ([id number]) the larger breast mass to consist of an invasive ductal carcinoma with lobular features, grade 2, estrogen and progesterone receptor both 100% positive, with an MIB-1 of 15%, and no HER-2 amplification. 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 The sampled right axillary lymph node was also positive.  Bilateral breast MRI 04/01/2013 showed the larger right breast mass to measure 5.4 cm, with a satellite nodule immediately inferior to it measuring 8 mm. 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 There were multiple enlarged right axillary lymph nodes, the largest measuring 2.8  cm. In addition, in the left breast there were 2 foci on non-masslike enhancement consistent with DCIS measuring 1.3 cm and 2.2 cm. These weren't different quadrants.  The patient's subsequent history is as noted below  INTERVAL HISTORY: [name] [name] returns today accompanied by her daughter [name] [name] for followup of her right breast carcinoma. She is due for day 1 cycle 5 of 6 planned q. three-week doses of adjuvant docetaxel/doxorubicin/cyclophosphamide, and is seen in the infusion room during treatment today. She receives Neulasta on day 2 for granulocyte support.  [name] [name] tells me it took almost the whole 3 weeks for her to recover after cycle 4. She is only had "a few good days", although fortunately the past 3 have been the best. She had some occasional nausea for which she took her antinausea medication appropriately. She has had increased reflux symptoms. She had several episodes of diarrhea last week, but fortunately these have resolved. She's had no rectal bleeding.  She is still extremely tired. Fortunately, she still denies any signs of numbness or tingling in the extremities.  REVIEW OF SYSTEMS: [name] [name] has had no fevers or chills. She completed her Cipro following cycle 4 for neutropenia. Her appetite is fair. Her bowels have returned to normal. She's had no change in urinary habits. She denies any increased cough or phlegm production. She does have shortness of breath with exertion which is stable. She denies any chest pain or palpitations. She's had no abnormal headaches or dizziness. She does have continued anxiety and depression which is stable, and sometimes  she feels forgetful. She currently denies any unusual myalgias, arthralgias, or bony pain. She occasionally has swelling in the feet and ankles although not currently.  A detailed review of systems is otherwise stable and noncontributory.   PAST MEDICAL HISTORY: Past Medical History  Diagnosis Date  . Hypertension   . Anxiety   .  Depression   . Headache(784.0)   . Sleep apnea     cpcp  . Shortness of breath   . Breast cancer     PAST SURGICAL HISTORY: Past Surgical History  Procedure Laterality Date  . Mastectomy, radical Right 04/08/2013  . Simple mastectomy with axillary sentinel node biopsy Left 04/08/2013  . Mastectomy modified radical Right 04/07/2013    Procedure: RIGHT MASTECTOMY MODIFIED RADICAL;  Surgeon: Kandis Cocking, MD;  Location: [facility] OR;  Service: General;  Laterality: Right;  . Simple mastectomy with axillary sentinel node biopsy Left 04/07/2013    Procedure:  LEFT SMPLE MASTECTOMY WITH AXILLARY SENTINEL NODE BIOPSY;  Surgeon: Kandis Cocking, MD;  Location: [facility] OR;  Service: General;  Laterality: Left;  . Portacath placement Left 04/07/2013    Procedure: INSERTION PORT-A-CATH;  Surgeon: Kandis Cocking, MD;  Location: [facility] OR;  Service: General;  Laterality: Left;    FAMILY HISTORY Family History  Problem Relation Age of Onset  . Breast cancer Mother 48  . Breast cancer Maternal Grandmother 44  . Prostate cancer Paternal Grandfather 25   the patient's father died at age 72 in an airplane crash (he was a Transport planner unsupervised). The patient's mother is alive at age 44. Her name is [name] [name] and she works for [employer]. [name] [name] has a history of breast cancer diagnosed at age 21. She had one sister who is alive and well. [name] [name]'s mother, the patient's grandmother, was diagnosed with breast cancer at age 75. She had 2 sisters, neither with breast cancer. [name] [name] herself had one brother, no sisters. There is no other history of breast or ovary cancer in the family.  GYNECOLOGIC HISTORY:  Menarche age 6, she is GX P23, first live birth age 39. The patient was taking birth control pills until September 2013. She stopped having periods at that time, but had one in late March 2014.  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 Her husband [name] [name] (goes by "[name] [name]") is  disabled secondary to diabetes. They were separated for the past 2 years, but [name] [name] recently moved back in with [place]. Ninamarie lives with her mother [name] [name] and 8 poodles. The patient's daughter [name] [name] is 47 and also lives with the patient.    ADVANCED DIRECTIVES: In place. The patient has named her daughter [name] [name] as her healthcare power of attorney, bypassing her husband.  HEALTH MAINTENANCE: History  Substance Use Topics  . Smoking status: Never Smoker   . Smokeless tobacco: Never Used  . Alcohol Use: No     Colonoscopy: Never  PAP: Possibly 2006  Bone density: Possibly 2004  Lipid panel:  Allergies  Allergen Reactions  . Vicodin [Hydrocodone-Acetaminophen] Anaphylaxis  . Codeine Nausea And Vomiting  . Penicillins Rash  . Percocet [Oxycodone-Acetaminophen] Itching  . Septra [Bactrim] Other (See Comments)    Heart races    Current Outpatient Prescriptions  Medication Sig Dispense Refill  . acetaminophen (TYLENOL) 500 MG tablet Take 2 tablets (1,000 mg total) by mouth every 6 (six) hours as needed (Maximum 8 tabs in 24 hours). For pain  50 tablet  1  .  ALPRAZolam (XANAX) 0.25 MG tablet Take 1 tablet by mouth Three times a day.      . ciprofloxacin (CIPRO) 500 MG tablet Take 1 tablet (500 mg total) by mouth 2 (two) times daily.  14 tablet  3  . dexamethasone (DECADRON) 4 MG tablet 2 tabs by mouth twice daily with food on day before chemo and 3 days after chemo  30 tablet  2  . diltiazem 2 % GEL Apply 1 application topically 2 (two) times daily.  30 g  1  . docusate sodium 100 MG CAPS Take 200 mg by mouth 2 (two) times daily.  10 capsule  0  . doxycycline (VIBRA-TABS) 100 MG tablet Take 1 tablet (100 mg total) by mouth 2 (two) times daily.  14 tablet  2  . escitalopram (LEXAPRO) 10 MG tablet Take 1 tablet by mouth daily.      Marland Kitchen lidocaine-prilocaine (EMLA) cream Apply topically as needed. 1-2 hours before each chemo  30 g  1  . loratadine (CLARITIN) 10 MG tablet 1  tablet by mouth once daily for 5 days after each dose of chemo  30 tablet  1  . meloxicam (MOBIC) 15 MG tablet Take 15 mg by mouth daily.      Marland Kitchen omeprazole (PRILOSEC) 20 MG capsule Take 1 capsule (20 mg total) by mouth 2 (two) times daily.  60 capsule  1  . ondansetron (ZOFRAN) 4 MG tablet Take 4 mg by mouth every 8 (eight) hours as needed for nausea.      . prochlorperazine (COMPAZINE) 10 MG tablet 1 tab by mouth with meals and bedtime x 3 days after chemo, then every 6 hrs as needed for nausea  30 tablet  2  . tobramycin-dexamethasone (TOBRADEX) ophthalmic solution Place 1 drop into both eyes 2 (two) times daily. For 7 days after each dose of chemo  5 mL  1  . traMADol (ULTRAM) 50 MG tablet Take 1 tablet (50 mg total) by mouth every 8 (eight) hours as needed for pain.  30 tablet  0  . triamterene-hydrochlorothiazide (MAXZIDE-25) 37.5-25 MG per tablet Take 1 tablet by mouth daily.       No current facility-administered medications for this visit.   Facility-Administered Medications Ordered in Other Visits  Medication Dose Route Frequency Provider Last Rate Last Dose  . 0.9 %  sodium chloride infusion   Intravenous Once Norell Brisbin G Malene Blaydes, PA-C      . cyclophosphamide (CYTOXAN) 1,020 mg in sodium chloride 0.9 % 250 mL chemo infusion  500 mg/m2 (Treatment Plan Actual) Intravenous Once Olivia Royse G Yue Flanigan, PA-C      . DOCEtaxel (TAXOTERE) 150 mg in dextrose 5 % 250 mL chemo infusion  75 mg/m2 (Treatment Plan Actual) Intravenous Once Merdith Adan G Jleigh Striplin, PA-C      . DOXOrubicin (ADRIAMYCIN) chemo injection 102 mg  50 mg/m2 (Treatment Plan Actual) Intravenous Once Marquia Costello G Lorell Thibodaux, PA-C      . heparin lock flush 100 unit/mL  500 Units Intracatheter Once PRN Karime Scheuermann Allegra Grana, PA-C      . sodium chloride 0.9 % injection 10 mL  10 mL Intracatheter PRN Dailey Buccheri G Marleta Lapierre, PA-C      . sodium chloride 0.9 % injection 10 mL  10 mL Intracatheter PRN Brylyn Novakovich Allegra Grana, PA-C        OBJECTIVE:   Middle-aged white female who appears her stated age,  currently in no acute distress, and examined in the infusion room while receiving her IV therapy.  Filed Vitals:   08/13/13 1152  BP: 133/81  Pulse: 81  Temp: 97.8 F (36.6 C)  Resp: 18     Body mass index is 37.72 kg/(m^2).    ECOG FS: 1 Filed Weights   08/13/13 1152  Weight: 206 lb 4.8 oz (93.577 kg)   Sclerae unicteric Oropharynx clear  Lungs clear to auscultation bilaterally, fair excursion bilaterally Heart regular rate and rhythm Abd obese, soft, nontender, positive bowel sounds No peripheral edema Neuro: nonfocal, well oriented, fatigued affect Breasts: Deferred.     LAB RESULTS:  Lab Results  Component Value Date   WBC 19.9* 08/13/2013   NEUTROABS 18.3* 08/13/2013   HGB 9.2* 08/13/2013   HCT 27.1* 08/13/2013   MCV 94.6 08/13/2013   PLT 447* 08/13/2013      Chemistry      Component Value Date/Time   NA 138 07/29/2013 0944   NA 141 04/19/2013 0815   K 3.5 07/29/2013 0944   K 3.8 04/19/2013 0815   CL 103 05/20/2013 1404   CL 106 04/19/2013 0815   CO2 27 07/29/2013 0944   CO2 24 04/19/2013 0815   BUN 15.8 07/29/2013 0944   BUN 14 04/19/2013 0815   CREATININE 0.8 07/29/2013 0944   CREATININE 0.79 04/19/2013 0815      Component Value Date/Time   CALCIUM 9.2 07/29/2013 0944   CALCIUM 8.8 04/19/2013 0815   ALKPHOS 55 07/29/2013 0944   ALKPHOS 63 04/17/2013 0741   AST 8 07/29/2013 0944   AST 23 04/17/2013 0741   ALT 10 07/29/2013 0944   ALT 26 04/17/2013 0741   BILITOT 0.83 07/29/2013 0944   BILITOT 0.2* 04/17/2013 0741       STUDIES:  Echocardiogram on 05/01/2013 showed an ejection fraction of 55-60%.    ASSESSMENT: 52 y.o. 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 There were also 2 suspicious areas in the left breast noted by MRI.  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 Patient's testing pending  (5)  started adjuvant chemotherapy, consisting of 6 planned cycles of docetaxel/ doxorubicin/ cyclophosphamide given on a Q. three-week basis, first dose  05/21/2013.    (6)  Adjuvant chemotherapy will be followed by postmastectomy radiation therapy, then antiestrogen.  (7)  Rectal fissure, stable  (8)  Anemia   PLAN:  [name] [name] will receive her fifth dose of chemotherapy today as scheduled, and will return tomorrow for her Neulasta injection. She will use the same antinausea medications, but will actually taper the dexamethasone, taking 2 mg in the mornings on days 5, 6, and 7. She was given this information in writing.  I am also prescribing omeprazole, 20 mg by mouth twice a day for reflux, likely exacerbated with the dexamethasone.  Otherwise, I will see [name] [name] again  next week for assessment of chemotoxicity on September [birth year] (her 52th birthday!). She's Re: scheduled for her sixth and final dose of adjuvant chemotherapy on October 8.  Annalynn voices understanding and agreement with this plan and will call with any changes or problems prior to her next appointment.  I will note that [name] [name]'s daughter is going to be scheduled for a surgical procedure in the next few weeks, and we may need to adjust [name] [name]'s treatment schedule slightly to accommodate her being able to take care of her daughter.  Arrianna Catala, PA-C   08/13/2013

## 2013-08-13 NOTE — Patient Instructions (Addendum)
[facility] Discharge Instructions for Patients Receiving Chemotherapy  Today you received the following chemotherapy agents Adriamycin, Cytoxan and Taxotere.  To help prevent nausea and vomiting after your treatment, we encourage you to take your nausea medication as prescribed.   If you develop nausea and vomiting that is not controlled by your nausea medication, call the clinic.   BELOW ARE SYMPTOMS THAT SHOULD BE REPORTED IMMEDIATELY:  *FEVER GREATER THAN 100.5 F  *CHILLS WITH OR WITHOUT FEVER  NAUSEA AND VOMITING THAT IS NOT CONTROLLED WITH YOUR NAUSEA MEDICATION  *UNUSUAL SHORTNESS OF BREATH  *UNUSUAL BRUISING OR BLEEDING  TENDERNESS IN MOUTH AND THROAT WITH OR WITHOUT PRESENCE OF ULCERS  *URINARY PROBLEMS  *BOWEL PROBLEMS  UNUSUAL RASH Items with * indicate a potential emergency and should be followed up as soon as possible.  Feel free to call the clinic you have any questions or concerns. The clinic phone number is [phone].    

## 2013-08-14 ENCOUNTER — Ambulatory Visit (HOSPITAL_BASED_OUTPATIENT_CLINIC_OR_DEPARTMENT_OTHER): Payer: Medicaid Other

## 2013-08-14 VITALS — BP 133/46 | HR 64 | Temp 98.0°F

## 2013-08-14 DIAGNOSIS — C50411 Malignant neoplasm of upper-outer quadrant of right female breast: Secondary | ICD-10-CM

## 2013-08-14 DIAGNOSIS — C50419 Malignant neoplasm of upper-outer quadrant of unspecified female breast: Secondary | ICD-10-CM

## 2013-08-14 DIAGNOSIS — Z5189 Encounter for other specified aftercare: Secondary | ICD-10-CM

## 2013-08-14 MED ORDER — PEGFILGRASTIM INJECTION 6 MG/0.6ML
6.0000 mg | Freq: Once | SUBCUTANEOUS | Status: AC
  Administered 2013-08-14: 6 mg via SUBCUTANEOUS
  Filled 2013-08-14: qty 0.6

## 2013-08-15 ENCOUNTER — Other Ambulatory Visit: Payer: Self-pay | Admitting: *Deleted

## 2013-08-15 DIAGNOSIS — C50411 Malignant neoplasm of upper-outer quadrant of right female breast: Secondary | ICD-10-CM

## 2013-08-15 MED ORDER — PROCHLORPERAZINE MALEATE 10 MG PO TABS: ORAL_TABLET | ORAL | Status: DC

## 2013-08-15 MED ORDER — DEXAMETHASONE 4 MG PO TABS: ORAL_TABLET | ORAL | Status: DC

## 2013-08-15 MED ORDER — LORATADINE 10 MG PO TABS: ORAL_TABLET | ORAL | Status: DC

## 2013-08-18 ENCOUNTER — Ambulatory Visit (HOSPITAL_BASED_OUTPATIENT_CLINIC_OR_DEPARTMENT_OTHER): Payer: Medicaid Other | Admitting: Genetic Counselor

## 2013-08-18 ENCOUNTER — Encounter: Payer: Self-pay | Admitting: Genetic Counselor

## 2013-08-18 ENCOUNTER — Other Ambulatory Visit: Payer: Medicaid Other | Admitting: Lab

## 2013-08-18 DIAGNOSIS — Z803 Family history of malignant neoplasm of breast: Secondary | ICD-10-CM

## 2013-08-18 DIAGNOSIS — C50419 Malignant neoplasm of upper-outer quadrant of unspecified female breast: Secondary | ICD-10-CM

## 2013-08-18 DIAGNOSIS — C50411 Malignant neoplasm of upper-outer quadrant of right female breast: Secondary | ICD-10-CM

## 2013-08-18 NOTE — Progress Notes (Signed)
Dr.  Raymond Gurney Magrinat requested a consultation for genetic counseling and risk assessment for [name] [name], a 52 y.o. female, for discussion of her personal and family history of breast cancer.  She presents to clinic today to discuss the possibility of a genetic predisposition to cancer, and to further clarify her risks, as well as her family members' risks for cancer.   HISTORY OF PRESENT ILLNESS: In 2014, at the age of 43, [name] [name] was diagnosed with invasive ductal carcinoma of the right breast. This was treated with double mastectomy, chemotherapy and radiaiton.  The tumor is ER+/PR+/Her2-.  She will be taking 10 years of tamoxifen.    Past Medical History  Diagnosis Date  . Hypertension   . Anxiety   . Depression   . Headache(784.0)   . Sleep apnea     cpcp  . Shortness of breath   . Breast cancer     Past Surgical History  Procedure Laterality Date  . Mastectomy, radical Right 04/08/2013  . Simple mastectomy with axillary sentinel node biopsy Left 04/08/2013  . Mastectomy modified radical Right 04/07/2013    Procedure: RIGHT MASTECTOMY MODIFIED RADICAL;  Surgeon: Kandis Cocking, MD;  Location: [facility] OR;  Service: General;  Laterality: Right;  . Simple mastectomy with axillary sentinel node biopsy Left 04/07/2013    Procedure:  LEFT SMPLE MASTECTOMY WITH AXILLARY SENTINEL NODE BIOPSY;  Surgeon: Kandis Cocking, MD;  Location: [facility] OR;  Service: General;  Laterality: Left;  . Portacath placement Left 04/07/2013    Procedure: INSERTION PORT-A-CATH;  Surgeon: Kandis Cocking, MD;  Location: [facility] OR;  Service: General;  Laterality: Left;    History   Social History  . Marital Status: Legally Separated    Spouse Name: N/A    Number of Children: 1  . Years of Education: N/A   Social History Main Topics  . Smoking status: Never Smoker   . Smokeless tobacco: Never Used  . Alcohol Use: No  . Drug Use: Yes    Special: Marijuana  . Sexual Activity: Yes   Other Topics Concern  .  None   Social History Narrative  . None    REPRODUCTIVE HISTORY AND PERSONAL RISK ASSESSMENT FACTORS: Menarche was at age 34.   perimenopausal Uterus Intact: yes Ovaries Intact: yes G1P1A0, first live birth at age 14  She has not previously undergone treatment for infertility.   Oral Contraceptive use: 31 years   She has not used HRT in the past.    FAMILY HISTORY:  We obtained a detailed, 4-generation family history.  Significant diagnoses are listed below: Family History  Problem Relation Age of Onset  . Breast cancer Mother 46  . Breast cancer Maternal Grandmother 45  . Prostate cancer Paternal Grandfather 73    Patient's ancestors are of native [place] descent. There is no reported Ashkenazi Jewish ancestry. There is no known consanguinity.  GENETIC COUNSELING ASSESSMENT: [name] [name] is a 52 y.o. female with a personal history of breast cancer and family history of breast cancer which somewhat suggestive of a hereditary breast cancer syndrome and predisposition to cancer. We, therefore, discussed and recommended the following at today's visit.   DISCUSSION: We reviewed the characteristics, features and inheritance patterns of hereditary cancer syndromes. We also discussed genetic testing, including the appropriate family members to test, the process of testing, insurance coverage and turn-around-time for results. We discussed that if the BRCA testing is negative we could be testing for the wrong  gene.  Therefore, once she has completed testing, if she is negative, she should consider pursuing further genetic testing.  PLAN: After considering the risks, benefits, and limitations, DELONDA COLEY provided informed consent to pursue genetic testing and the blood sample will be sent to [facility] for analysis of the BRCA genes. We discussed the implications of a positive, negative and/ or variant of uncertain significance genetic test result. Results should be available within  approximately 2-4 weeks' time, at which point they will be disclosed by telephone to [name], as will any additional recommendations warranted by these results. [name] will receive a summary of her genetic counseling visit and a copy of her results once available. This information will also be available in Epic. We encouraged BRITLEE SKOLNIK to remain in contact with cancer genetics annually so that we can continuously update the family history and inform her of any changes in cancer genetics and testing that may be of benefit for her family. Roxan Diesel Dinkins's questions were answered to her satisfaction today. Our contact information was provided should additional questions or concerns arise.  The patient was seen for a total of 60 minutes, greater than 50% of which was spent face-to-face counseling.  This plan is being carried out per Dr. Feliz Beam recommendations.  This note will also be sent to the referring provider via the electronic medical record. The patient will be supplied with a summary of this genetic counseling discussion as well as educational information on the discussed hereditary cancer syndromes following the conclusion of their visit.   Patient was discussed with Dr. Drue Second.   _______________________________________________________________________ For Office Staff:  Number of people involved in session: 2 Was an Intern/ student involved with case: yes

## 2013-08-20 ENCOUNTER — Encounter: Payer: Self-pay | Admitting: Physician Assistant

## 2013-08-20 ENCOUNTER — Other Ambulatory Visit (HOSPITAL_BASED_OUTPATIENT_CLINIC_OR_DEPARTMENT_OTHER): Payer: Medicaid Other | Admitting: Lab

## 2013-08-20 ENCOUNTER — Telehealth: Payer: Self-pay | Admitting: *Deleted

## 2013-08-20 ENCOUNTER — Ambulatory Visit (HOSPITAL_BASED_OUTPATIENT_CLINIC_OR_DEPARTMENT_OTHER): Payer: Medicaid Other | Admitting: Physician Assistant

## 2013-08-20 VITALS — BP 139/59 | HR 108 | Temp 99.3°F | Resp 18 | Ht 62.0 in | Wt 197.1 lb

## 2013-08-20 DIAGNOSIS — D702 Other drug-induced agranulocytosis: Secondary | ICD-10-CM

## 2013-08-20 DIAGNOSIS — C50419 Malignant neoplasm of upper-outer quadrant of unspecified female breast: Secondary | ICD-10-CM

## 2013-08-20 DIAGNOSIS — C50411 Malignant neoplasm of upper-outer quadrant of right female breast: Secondary | ICD-10-CM

## 2013-08-20 DIAGNOSIS — K6289 Other specified diseases of anus and rectum: Secondary | ICD-10-CM

## 2013-08-20 DIAGNOSIS — D701 Agranulocytosis secondary to cancer chemotherapy: Secondary | ICD-10-CM

## 2013-08-20 DIAGNOSIS — T451X5A Adverse effect of antineoplastic and immunosuppressive drugs, initial encounter: Secondary | ICD-10-CM

## 2013-08-20 DIAGNOSIS — K602 Anal fissure, unspecified: Secondary | ICD-10-CM

## 2013-08-20 DIAGNOSIS — D649 Anemia, unspecified: Secondary | ICD-10-CM

## 2013-08-20 LAB — COMPREHENSIVE METABOLIC PANEL (CC13)
Alkaline Phosphatase: 58 U/L (ref 40–150)
BUN: 19.5 mg/dL (ref 7.0–26.0)
Creatinine: 0.8 mg/dL (ref 0.6–1.1)
Glucose: 83 mg/dl (ref 70–140)
Sodium: 139 mEq/L (ref 136–145)
Total Bilirubin: 0.77 mg/dL (ref 0.20–1.20)
Total Protein: 6.4 g/dL (ref 6.4–8.3)

## 2013-08-20 LAB — CBC WITH DIFFERENTIAL/PLATELET
BASO%: 1.3 % (ref 0.0–2.0)
Eosinophils Absolute: 0.1 10*3/uL (ref 0.0–0.5)
LYMPH%: 67.9 % — ABNORMAL HIGH (ref 14.0–49.7)
MCHC: 34.3 g/dL (ref 31.5–36.0)
MCV: 95.1 fL (ref 79.5–101.0)
MONO%: 7.8 % (ref 0.0–14.0)
Platelets: 238 10*3/uL (ref 145–400)
RBC: 2.86 10*6/uL — ABNORMAL LOW (ref 3.70–5.45)

## 2013-08-20 NOTE — Telephone Encounter (Signed)
appts made and printed. Pt has appt w/ [name] [name] on 08/27/13@ 3pm...td

## 2013-08-20 NOTE — Progress Notes (Signed)
Patient ID: [name] [name], female   DOB: 18-Sep-1961, 52 y.o.   MRN: [id number] ID: [name] [name] OB: [birth year]  MR#: [id number]  HYQ#:[id number]  PCP: Raliegh Ip, MD GYN:   SU: Ovidio Kin OTHER MD: Chipper Herb;  Romie Levee;  Dorene Grebe; Etter Sjogren  CHIEF COMPLAINT:  Bilateral Breast Cancers   HISTORY OF PRESENT ILLNESS: [name] [name] tells me she has not been able to have mammography for several years because of cost issues. More recently, she had noted a mass in her right breast but thought this might be MRSA. The urinary tract infection took her to her physician's office, and she brought the breast mass to the attention of [name] Pisciotta NP. She set up the patient for bilateral diagnostic mammography and right breast ultrasound at the [facility] 03/27/2013, which showed a 4 cm irregular mass in the upper outer right breast, with a 9 mm cluster of calcifications in the posterior lower right breast and a few mildly enlarged level I right axillary lymph nodes. The breast mass was palpable and ultrasound showed it to measure 4.1 cm, with some of the level I right axillary lymph nodes noted to have lost their fatty hilum.  Biopsy of both the suspicious areas in the right breast as well as one of the suspicious lymph nodes was performed 03/27/2013 and showed ([id number]) the larger breast mass to consist of an invasive ductal carcinoma with lobular features, grade 2, estrogen and progesterone receptor both 100% positive, with an MIB-1 of 15%, and no HER-2 amplification. 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 The sampled right axillary lymph node was also positive.  Bilateral breast MRI 04/01/2013 showed the larger right breast mass to measure 5.4 cm, with a satellite nodule immediately inferior to it measuring 8 mm. 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 There were multiple  enlarged right axillary lymph nodes, the largest measuring 2.8 cm. In addition, in the left breast there were 2 foci on non-masslike enhancement consistent with DCIS measuring 1.3 cm and 2.2 cm. These weren't different quadrants.  The patient's subsequent history is as noted below  INTERVAL HISTORY: [name] [name] returns today accompanied by her daughter [name] [name] for followup of her right breast carcinoma. She is currently day 8 cycle 5 of 6 planned q. three-week doses of adjuvant docetaxel/doxorubicin/cyclophosphamide. She receives Neulasta on day 2 for granulocyte support.  [name] [name] is recovering well from cycle 5. She utilized a dexamethasone taper on days 5, 6, and 7 as discussed last week, and she feels that this help her recover much quicker than with past cycles. Her energy level is fair. She continues to deny any problems with peripheral neuropathy.  [name] [name] has had some occasional nausea but no emesis. She takes her antinausea medication appropriately and affectively. She's had firmer bowel movements but no actual constipation. Of course this causes some discomfort secondary to her rectal fissure. She's had only slight rectal bleeding which she is keeping under control.   REVIEW OF SYSTEMS: [name] [name] has had no fevers or chills. She denies any rashes or skin changes. She's had no abnormal bruising or bleeding. She denies any mouth ulcers, oral sensitivity, or problems swallowing.  She's had no change in urinary habits. She denies any increased cough or phlegm production. She continues to have shortness of breath with exertion which is stable. She denies any chest pain or palpitations. She's had no  abnormal headaches, change in vision or dizziness. She does have continued anxiety and depression which is stable. She currently denies any unusual myalgias, arthralgias, or bony pain. She has had no recent peripheral swelling.  A detailed review of systems is otherwise stable and noncontributory.   PAST MEDICAL  HISTORY: Past Medical History  Diagnosis Date  . Hypertension   . Anxiety   . Depression   . Headache(784.0)   . Sleep apnea     cpcp  . Shortness of breath   . Breast cancer     PAST SURGICAL HISTORY: Past Surgical History  Procedure Laterality Date  . Mastectomy, radical Right 04/08/2013  . Simple mastectomy with axillary sentinel node biopsy Left 04/08/2013  . Mastectomy modified radical Right 04/07/2013    Procedure: RIGHT MASTECTOMY MODIFIED RADICAL;  Surgeon: Kandis Cocking, MD;  Location: [facility] OR;  Service: General;  Laterality: Right;  . Simple mastectomy with axillary sentinel node biopsy Left 04/07/2013    Procedure:  LEFT SMPLE MASTECTOMY WITH AXILLARY SENTINEL NODE BIOPSY;  Surgeon: Kandis Cocking, MD;  Location: [facility] OR;  Service: General;  Laterality: Left;  . Portacath placement Left 04/07/2013    Procedure: INSERTION PORT-A-CATH;  Surgeon: Kandis Cocking, MD;  Location: [facility];  Service: General;  Laterality: Left;    FAMILY HISTORY Family History  Problem Relation Age of Onset  . Breast cancer Mother 25  . Breast cancer Maternal Grandmother 45  . Prostate cancer Paternal Grandfather 19   the patient's father died at age 62 in an airplane crash (he was a Transport planner unsupervised). The patient's mother is alive at age 41. Her name is [name] [name] and she works for [employer]. [name] [name] has a history of breast cancer diagnosed at age 49. She had one sister who is alive and well. [name]'s mother, the patient's grandmother, was diagnosed with breast cancer at age 50. She had 2 sisters, neither with breast cancer. [name] herself had one brother, no sisters. There is no other history of breast or ovary cancer in the family.  GYNECOLOGIC HISTORY:  Menarche age 69, she is GX P71, first live birth age 45. The patient was taking birth control pills until September 2013. She stopped having periods at that time, but had one in late March 2014.  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 Her husband [name] [name] (goes by "[name] [name]") is disabled secondary to diabetes. They were separated for the past 2 years, but [name] [name] recently moved back in with [name] [name]. [name] [name] lives with her mother [name] [name] and 8 poodles. The patient's daughter [name] [name] is 74 and also lives with the patient.    ADVANCED DIRECTIVES: In place. The patient has named her daughter [name] [name] as her healthcare power of attorney, bypassing her husband.  HEALTH MAINTENANCE: History  Substance Use Topics  . Smoking status: Never Smoker   . Smokeless tobacco: Never Used  . Alcohol Use: No     Colonoscopy: Never  PAP: Possibly 2006  Bone density: Possibly 2004  Lipid panel:  Allergies  Allergen Reactions  . Vicodin [Hydrocodone-Acetaminophen] Anaphylaxis  . Codeine Nausea And Vomiting  . Penicillins Rash  . Percocet [Oxycodone-Acetaminophen] Itching  . Septra [Bactrim] Other (See Comments)    Heart races    Current Outpatient Prescriptions  Medication Sig Dispense Refill  . acetaminophen (TYLENOL) 500 MG tablet Take 2 tablets (1,000 mg total) by mouth every 6 (six) hours as needed (Maximum 8 tabs in  24 hours). For pain  50 tablet  1  . ALPRAZolam (XANAX) 0.25 MG tablet Take 1 tablet by mouth Three times a day.      . ciprofloxacin (CIPRO) 500 MG tablet Take 1 tablet (500 mg total) by mouth 2 (two) times daily.  14 tablet  3  . dexamethasone (DECADRON) 4 MG tablet 2 tabs by mouth twice daily with food on day before chemo and 3 days after chemo  30 tablet  0  . diltiazem 2 % GEL Apply 1 application topically 2 (two) times daily.  30 g  1  . docusate sodium 100 MG CAPS Take 200 mg by mouth 2 (two) times daily.  10 capsule  0  . doxycycline (VIBRA-TABS) 100 MG tablet Take 1 tablet (100 mg total) by mouth 2 (two) times daily.  14 tablet  2  . escitalopram (LEXAPRO) 10 MG tablet Take 1 tablet by mouth daily.      Marland Kitchen lidocaine-prilocaine (EMLA) cream Apply topically as  needed. 1-2 hours before each chemo  30 g  1  . loratadine (CLARITIN) 10 MG tablet 1 tablet by mouth once daily for 5 days after each dose of chemo  30 tablet  0  . meloxicam (MOBIC) 15 MG tablet Take 15 mg by mouth daily.      Marland Kitchen omeprazole (PRILOSEC) 20 MG capsule Take 1 capsule (20 mg total) by mouth 2 (two) times daily.  60 capsule  1  . ondansetron (ZOFRAN) 4 MG tablet Take 4 mg by mouth every 8 (eight) hours as needed for nausea.      . prochlorperazine (COMPAZINE) 10 MG tablet 1 tab by mouth with meals and bedtime x 3 days after chemo, then every 6 hrs as needed for nausea  30 tablet  0  . tobramycin-dexamethasone (TOBRADEX) ophthalmic solution Place 1 drop into both eyes 2 (two) times daily. For 7 days after each dose of chemo  5 mL  1  . traMADol (ULTRAM) 50 MG tablet Take 1 tablet (50 mg total) by mouth every 8 (eight) hours as needed for pain.  30 tablet  0  . triamterene-hydrochlorothiazide (MAXZIDE-25) 37.5-25 MG per tablet Take 1 tablet by mouth daily.       No current facility-administered medications for this visit.   Facility-Administered Medications Ordered in Other Visits  Medication Dose Route Frequency Provider Last Rate Last Dose  . sodium chloride 0.9 % injection 10 mL  10 mL Intracatheter PRN Karissa Meenan Allegra Grana, PA-C        OBJECTIVE:   Middle-aged white female who appears her stated age, currently comfortable and in no acute distress.  Filed Vitals:   08/20/13 1346  BP: 139/59  Pulse: 108  Temp: 99.3 F (37.4 C)  Resp: 18     Body mass index is 36.04 kg/(m^2).    ECOG FS: 1 Filed Weights   08/20/13 1346  Weight: 197 lb 1.6 oz (89.404 kg)   Physical Exam: HEENT:  Sclerae anicteric.  Oropharynx clear. No evidence of ulcerations or mucositis. NODES:  No cervical or supraclavicular lymphadenopathy palpated.  BREAST EXAM:  Patient status post bilateral mastectomies with well-healed incisions and no evidence of local recurrence. Axillae are benign bilaterally, no  palpable adenopathy LUNGS:  Clear to auscultation bilaterally.  No wheezes or rhonchi HEART:  Regular rate and rhythm.  ABDOMEN:  Soft, obese, nontender.  Positive bowel sounds.  MSK:  No focal spinal tenderness to palpation. Good range of motion in the upper extremities. EXTREMITIES:  No peripheral edema.   SKIN:  Port is intact in the left upper chest wall with no erythema, edema, or evidence of infection NEURO:  Nonfocal. Well oriented.  Tired affect.   LAB RESULTS:  Lab Results  Component Value Date   WBC 1.9* 08/20/2013   NEUTROABS 0.4* 08/20/2013   HGB 9.3* 08/20/2013   HCT 27.2* 08/20/2013   MCV 95.1 08/20/2013   PLT 238 08/20/2013      Chemistry      Component Value Date/Time   NA 138 07/29/2013 0944   NA 141 04/19/2013 0815   K 3.5 07/29/2013 0944   K 3.8 04/19/2013 0815   CL 103 05/20/2013 1404   CL 106 04/19/2013 0815   CO2 27 07/29/2013 0944   CO2 24 04/19/2013 0815   BUN 15.8 07/29/2013 0944   BUN 14 04/19/2013 0815   CREATININE 0.8 07/29/2013 0944   CREATININE 0.79 04/19/2013 0815      Component Value Date/Time   CALCIUM 9.2 07/29/2013 0944   CALCIUM 8.8 04/19/2013 0815   ALKPHOS 55 07/29/2013 0944   ALKPHOS 63 04/17/2013 0741   AST 8 07/29/2013 0944   AST 23 04/17/2013 0741   ALT 10 07/29/2013 0944   ALT 26 04/17/2013 0741   BILITOT 0.83 07/29/2013 0944   BILITOT 0.2* 04/17/2013 0741       STUDIES:  Echocardiogram on 05/01/2013 showed an ejection fraction of 55-60%.    ASSESSMENT: 52 y.o. 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 There were also 2 suspicious areas in the  left breast noted by MRI.  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 Patient's testing pending  (5)  started adjuvant chemotherapy, consisting of 6 planned cycles of docetaxel/ doxorubicin/ cyclophosphamide given on a Q. three-week basis, first dose  05/21/2013.    (6)  Adjuvant chemotherapy will be followed by postmastectomy radiation therapy, then antiestrogen.  (7)  Rectal fissure, stable  (8)  Anemia  (9)  chemotherapy-induced neutropenia, afebrile   PLAN:  Syrah will start back on Cipro prophylactically, 500 mg by mouth twice a day for 7 days, for her chemotherapy-induced neutropenia. We again reviewed neutropenic precautions, and she understands to call us immediately with any temperatures of 100 or above.  Hodaya will return in 2 weeks on October 8 for repeat labs and physical exam prior to her sixth and final dose of adjuvant chemotherapy. We are making no changes to her current regimen. We will schedule her to see Dr. Dayton Scrape in mid-October after completion of chemotherapy to discuss her upcoming radiation therapy. She would also like a referral to Dr. Odis Luster for consultation regarding possible breast reconstruction.  Jyllian voices understanding and agreement with this plan and will call with any changes or problems prior to her next  appointment.  I will note that Adalai's daughter is going to be scheduled for a surgical procedure in the next few weeks, and we may need to adjust Agustina's treatment schedule slightly to accommodate her being able to take care of her daughter.  Zollie Scale, PA-C   08/20/2013

## 2013-08-21 ENCOUNTER — Ambulatory Visit: Payer: Medicaid Other

## 2013-08-25 ENCOUNTER — Encounter (INDEPENDENT_AMBULATORY_CARE_PROVIDER_SITE_OTHER): Payer: Self-pay

## 2013-08-26 ENCOUNTER — Encounter (INDEPENDENT_AMBULATORY_CARE_PROVIDER_SITE_OTHER): Payer: Self-pay

## 2013-08-29 ENCOUNTER — Telehealth: Payer: Self-pay | Admitting: Genetic Counselor

## 2013-08-29 ENCOUNTER — Encounter: Payer: Self-pay | Admitting: Genetic Counselor

## 2013-08-29 NOTE — Telephone Encounter (Signed)
Revealed negative BRCA testing.  Discussed that since her mother and grandmother both had breast cancer at early ages, it is conderning that maybe the cancer in the family is the result of a change in a different gene.  If she gets Nurse, learning disability, please consider additional testing.

## 2013-09-03 ENCOUNTER — Encounter (INDEPENDENT_AMBULATORY_CARE_PROVIDER_SITE_OTHER): Payer: Self-pay

## 2013-09-03 ENCOUNTER — Ambulatory Visit (HOSPITAL_BASED_OUTPATIENT_CLINIC_OR_DEPARTMENT_OTHER): Payer: Medicaid Other | Admitting: Physician Assistant

## 2013-09-03 ENCOUNTER — Encounter: Payer: Self-pay | Admitting: Physician Assistant

## 2013-09-03 ENCOUNTER — Ambulatory Visit (HOSPITAL_BASED_OUTPATIENT_CLINIC_OR_DEPARTMENT_OTHER): Payer: Medicaid Other

## 2013-09-03 ENCOUNTER — Other Ambulatory Visit (HOSPITAL_BASED_OUTPATIENT_CLINIC_OR_DEPARTMENT_OTHER): Payer: Medicaid Other | Admitting: Lab

## 2013-09-03 VITALS — BP 119/69 | HR 69 | Temp 98.7°F | Resp 18 | Ht 62.0 in | Wt 200.2 lb

## 2013-09-03 DIAGNOSIS — C50411 Malignant neoplasm of upper-outer quadrant of right female breast: Secondary | ICD-10-CM

## 2013-09-03 DIAGNOSIS — C50419 Malignant neoplasm of upper-outer quadrant of unspecified female breast: Secondary | ICD-10-CM

## 2013-09-03 DIAGNOSIS — Z5111 Encounter for antineoplastic chemotherapy: Secondary | ICD-10-CM

## 2013-09-03 DIAGNOSIS — Z170 Estrogen receptor positive status [ER+]: Secondary | ICD-10-CM

## 2013-09-03 DIAGNOSIS — D72829 Elevated white blood cell count, unspecified: Secondary | ICD-10-CM

## 2013-09-03 DIAGNOSIS — D649 Anemia, unspecified: Secondary | ICD-10-CM

## 2013-09-03 DIAGNOSIS — C773 Secondary and unspecified malignant neoplasm of axilla and upper limb lymph nodes: Secondary | ICD-10-CM

## 2013-09-03 DIAGNOSIS — K602 Anal fissure, unspecified: Secondary | ICD-10-CM

## 2013-09-03 LAB — CBC WITH DIFFERENTIAL/PLATELET
BASO%: 0.1 % (ref 0.0–2.0)
Basophils Absolute: 0 10*3/uL (ref 0.0–0.1)
Eosinophils Absolute: 0 10*3/uL (ref 0.0–0.5)
HCT: 26.5 % — ABNORMAL LOW (ref 34.8–46.6)
HGB: 9 g/dL — ABNORMAL LOW (ref 11.6–15.9)
LYMPH%: 4.4 % — ABNORMAL LOW (ref 14.0–49.7)
MCV: 96.3 fL (ref 79.5–101.0)
MONO#: 0.3 10*3/uL (ref 0.1–0.9)
MONO%: 1.5 % (ref 0.0–14.0)
NEUT#: 17.4 10*3/uL — ABNORMAL HIGH (ref 1.5–6.5)
NEUT%: 94 % — ABNORMAL HIGH (ref 38.4–76.8)
Platelets: 391 10*3/uL (ref 145–400)

## 2013-09-03 MED ORDER — PROCHLORPERAZINE MALEATE 10 MG PO TABS: ORAL_TABLET | ORAL | Status: DC

## 2013-09-03 MED ORDER — DOCETAXEL CHEMO INJECTION 160 MG/16ML
75.0000 mg/m2 | Freq: Once | INTRAVENOUS | Status: AC
  Administered 2013-09-03: 150 mg via INTRAVENOUS
  Filled 2013-09-03: qty 15

## 2013-09-03 MED ORDER — DEXAMETHASONE SODIUM PHOSPHATE 20 MG/5ML IJ SOLN
INTRAMUSCULAR | Status: AC
  Filled 2013-09-03: qty 5

## 2013-09-03 MED ORDER — SODIUM CHLORIDE 0.9 % IV SOLN
Freq: Once | INTRAVENOUS | Status: AC
  Administered 2013-09-03: 12:00:00 via INTRAVENOUS

## 2013-09-03 MED ORDER — HEPARIN SOD (PORK) LOCK FLUSH 100 UNIT/ML IV SOLN
500.0000 [IU] | Freq: Once | INTRAVENOUS | Status: AC | PRN
  Administered 2013-09-03: 500 [IU]
  Filled 2013-09-03: qty 5

## 2013-09-03 MED ORDER — DEXAMETHASONE 4 MG PO TABS: ORAL_TABLET | ORAL | Status: DC

## 2013-09-03 MED ORDER — PALONOSETRON HCL INJECTION 0.25 MG/5ML
0.2500 mg | Freq: Once | INTRAVENOUS | Status: AC
  Administered 2013-09-03: 0.25 mg via INTRAVENOUS

## 2013-09-03 MED ORDER — SODIUM CHLORIDE 0.9 % IV SOLN
500.0000 mg/m2 | Freq: Once | INTRAVENOUS | Status: AC
  Administered 2013-09-03: 1020 mg via INTRAVENOUS
  Filled 2013-09-03: qty 51

## 2013-09-03 MED ORDER — DEXAMETHASONE SODIUM PHOSPHATE 20 MG/5ML IJ SOLN
12.0000 mg | Freq: Once | INTRAMUSCULAR | Status: AC
  Administered 2013-09-03: 12 mg via INTRAVENOUS

## 2013-09-03 MED ORDER — PALONOSETRON HCL INJECTION 0.25 MG/5ML
INTRAVENOUS | Status: AC
  Filled 2013-09-03: qty 5

## 2013-09-03 MED ORDER — SODIUM CHLORIDE 0.9 % IV SOLN
150.0000 mg | Freq: Once | INTRAVENOUS | Status: AC
  Administered 2013-09-03: 150 mg via INTRAVENOUS
  Filled 2013-09-03: qty 5

## 2013-09-03 MED ORDER — SODIUM CHLORIDE 0.9 % IJ SOLN
10.0000 mL | INTRAMUSCULAR | Status: DC | PRN
  Administered 2013-09-03: 10 mL
  Filled 2013-09-03: qty 10

## 2013-09-03 MED ORDER — DOXORUBICIN HCL CHEMO IV INJECTION 2 MG/ML
50.0000 mg/m2 | Freq: Once | INTRAVENOUS | Status: AC
  Administered 2013-09-03: 102 mg via INTRAVENOUS
  Filled 2013-09-03: qty 51

## 2013-09-03 NOTE — Progress Notes (Signed)
Patient ID: [name] [name], female   DOB: [birth year], 52 y.o.   MRN: [id number] ID: [name] [name] OB: 01-10-1961  MR#: [id number]  OZD#:[id number]  PCP: [name] Ip, MD GYN:   SU: [name] [name] OTHER MD: [name] [name];  [name] [name];  [name] [name]; [name] [name]  CHIEF COMPLAINT:  Bilateral Breast Cancers   HISTORY OF PRESENT ILLNESS: [name] [name] tells me she has not been able to have mammography for several years because of cost issues. More recently, she had noted a mass in her right breast but thought this might be MRSA. The urinary tract infection took her to her physician's office, and she brought the breast mass to the attention of [name] Pisciotta NP. She set up the patient for bilateral diagnostic mammography and right breast ultrasound at the [facility] Center 03/27/2013, which showed a 4 cm irregular mass in the upper outer right breast, with a 9 mm cluster of calcifications in the posterior lower right breast and a few mildly enlarged level I right axillary lymph nodes. The breast mass was palpable and ultrasound showed it to measure 4.1 cm, with some of the level I right axillary lymph nodes noted to have lost their fatty hilum.  Biopsy of both the suspicious areas in the right breast as well as one of the suspicious lymph nodes was performed 03/27/2013 and showed ([id number]) the larger breast mass to consist of an invasive ductal carcinoma with lobular features, grade 2, estrogen and progesterone receptor both 100% positive, with an MIB-1 of 15%, and no HER-2 amplification. 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 The sampled right axillary lymph node was also positive.  Bilateral breast MRI 04/01/2013 showed the larger right breast mass to measure 5.4 cm, with a satellite nodule immediately inferior to it measuring 8 mm. 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 There were multiple  enlarged right axillary lymph nodes, the largest measuring 2.8 cm. In addition, in the left breast there were 2 foci on non-masslike enhancement consistent with DCIS measuring 1.3 cm and 2.2 cm. These weren't different quadrants.  The patient's subsequent history is as noted below  INTERVAL HISTORY: [name] [name] returns today accompanied by her mother and her daughter [name] [name] for followup of her right breast carcinoma. She is due for her sixth and final q. three-week dose of adjuvant docetaxel/doxorubicin/cyclophosphamide today.. She receives Neulasta on day 2 for granulocyte support.  [name] [name] is feeling well, feels well recovered from cycle 5, and is ready to finish her chemotherapy today. She "can't wait to ring that bell".   As with cycle 4, [name] [name] again utilized a dexamethasone taper following cycle 5 and she does feel that this helps her recover much quicker. Her energy level today is fairly good. She's eating and drinking well. She's having regular bowel movements, and has had no additional pain [name] bleeding associated with her rectal fissure.   REVIEW OF SYSTEMS: [name] [name] has had no fevers [name] chills. She has a rash under her right arm, but no additional skin changes. She thinks this may be associated with her deodorant. She's had no abnormal bruising [name] bleeding. She denies any mouth ulcers, oral sensitivity, [name] problems swallowing.  She has occasional nausea for which she takes her antinausea medication affectively. She's had no emesis. She denies any dysuria [name] hematuria.  She denies any increased cough [name] phlegm production. She continues to have shortness of breath with  exertion which is stable. She denies any chest pain [name] palpitations. She's had no abnormal headaches, change in vision [name] dizziness. She does have continued anxiety and depression which is stable. She denies suicidal ideation. She currently denies any unusual myalgias, arthralgias, [name] bony pain. She has had no recent peripheral swelling. She  continues to deny any increased peripheral neuropathy in either the upper [name] lower extremities.  A detailed review of systems is otherwise stable and noncontributory.   PAST MEDICAL HISTORY: Past Medical History  Diagnosis Date  . Hypertension   . Anxiety   . Depression   . Headache(784.0)   . Sleep apnea     cpcp  . Shortness of breath   . Breast cancer     PAST SURGICAL HISTORY: Past Surgical History  Procedure Laterality Date  . Mastectomy, radical Right 04/08/2013  . Simple mastectomy with axillary sentinel node biopsy Left 04/08/2013  . Mastectomy modified radical Right 04/07/2013    Procedure: RIGHT MASTECTOMY MODIFIED RADICAL;  Surgeon: [name] Cocking, MD;  Location: [name];  Service: General;  Laterality: Right;  . Simple mastectomy with axillary sentinel node biopsy Left 04/07/2013    Procedure:  LEFT SMPLE MASTECTOMY WITH AXILLARY SENTINEL NODE BIOPSY;  Surgeon: [name] Cocking, MD;  Location: [name];  Service: General;  Laterality: Left;  . Portacath placement Left 04/07/2013    Procedure: INSERTION PORT-A-CATH;  Surgeon: [name] Cocking, MD;  Location: [name];  Service: General;  Laterality: Left;    FAMILY HISTORY Family History  Problem Relation Age of Onset  . Breast cancer Mother 18  . Breast cancer Maternal Grandmother 45  . Prostate cancer Paternal Grandfather 44   the patient's father died at age 50 in an airplane crash (he was a Transport planner unsupervised). The patient's mother is alive at age 17. Her name is [name] [name] and she works for [employer]. [name] [name] has a history of breast cancer diagnosed at age 9. She had one sister who is alive and well. [name] [name]'s mother, the patient's grandmother, was diagnosed with breast cancer at age 21. She had 2 sisters, neither with breast cancer. Solyana herself had one brother, no sisters. There is no other history of breast [name] ovary cancer in the family.  GYNECOLOGIC HISTORY:  Menarche age 47, she is GX P50, first live birth  age 66. The patient was taking birth control pills until September 2013. She stopped having periods at that time, but had one in late March 2014.  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 Her husband Mindel Friscia (goes by "[name]") is disabled secondary to diabetes. They were separated for the past 2 years, but [name] recently moved back in with [place]. Bellarose lives with her mother [name] [name] and 8 poodles. The patient's daughter [name] "[name] [name]" Penado is 21 and also lives with the patient.    ADVANCED DIRECTIVES: In place. The patient has named her daughter [name] [name] as her healthcare power of attorney, bypassing her husband.  HEALTH MAINTENANCE: History  Substance Use Topics  . Smoking status: Never Smoker   . Smokeless tobacco: Never Used  . Alcohol Use: No     Colonoscopy: Never  PAP: Possibly 2006  Bone density: Possibly 2004  Lipid panel:  Allergies  Allergen Reactions  . Vicodin [Hydrocodone-Acetaminophen] Anaphylaxis  . Codeine Nausea And Vomiting  . Penicillins Rash  . Percocet [Oxycodone-Acetaminophen] Itching  . Septra [Bactrim] Other (See Comments)    Heart races  Current Outpatient Prescriptions  Medication Sig Dispense Refill  . acetaminophen (TYLENOL) 500 MG tablet Take 2 tablets (1,000 mg total) by mouth every 6 (six) hours as needed (Maximum 8 tabs in 24 hours). For pain  50 tablet  1  . ALPRAZolam (XANAX) 0.25 MG tablet Take 1 tablet by mouth Three times a day.      . ciprofloxacin (CIPRO) 500 MG tablet Take 1 tablet (500 mg total) by mouth 2 (two) times daily.  14 tablet  3  . dexamethasone (DECADRON) 4 MG tablet 2 tabs by mouth twice daily with food on day before chemo and 3 days after chemo  30 tablet  0  . diltiazem 2 % GEL Apply 1 application topically 2 (two) times daily.  30 g  1  . docusate sodium 100 MG CAPS Take 200 mg by mouth 2 (two) times daily.  10 capsule  0  . doxycycline (VIBRA-TABS) 100 MG tablet  Take 1 tablet (100 mg total) by mouth 2 (two) times daily.  14 tablet  2  . escitalopram (LEXAPRO) 10 MG tablet Take 1 tablet by mouth daily.      Marland Kitchen lidocaine-prilocaine (EMLA) cream Apply topically as needed. 1-2 hours before each chemo  30 g  1  . loratadine (CLARITIN) 10 MG tablet 1 tablet by mouth once daily for 5 days after each dose of chemo  30 tablet  0  . meloxicam (MOBIC) 15 MG tablet Take 15 mg by mouth daily.      Marland Kitchen omeprazole (PRILOSEC) 20 MG capsule Take 1 capsule (20 mg total) by mouth 2 (two) times daily.  60 capsule  1  . ondansetron (ZOFRAN) 4 MG tablet Take 4 mg by mouth every 8 (eight) hours as needed for nausea.      . prochlorperazine (COMPAZINE) 10 MG tablet 1 tab by mouth with meals and bedtime x 3 days after chemo, then every 6 hrs as needed for nausea  30 tablet  0  . tobramycin-dexamethasone (TOBRADEX) ophthalmic solution Place 1 drop into both eyes 2 (two) times daily. For 7 days after each dose of chemo  5 mL  1  . traMADol (ULTRAM) 50 MG tablet Take 1 tablet (50 mg total) by mouth every 8 (eight) hours as needed for pain.  30 tablet  0  . triamterene-hydrochlorothiazide (MAXZIDE-25) 37.5-25 MG per tablet Take 1 tablet by mouth daily.       No current facility-administered medications for this visit.   Facility-Administered Medications Ordered in Other Visits  Medication Dose Route Frequency Provider Last Rate Last Dose  . sodium chloride 0.9 % injection 10 mL  10 mL Intracatheter PRN Levester Waldridge G Relda Agosto, PA-C      . sodium chloride 0.9 % injection 10 mL  10 mL Intracatheter PRN Kailah Pennel Allegra Grana, PA-C   10 mL at 09/03/13 1507    OBJECTIVE:   Middle-aged white female who appears her stated age, currently comfortable and in no acute distress.  Filed Vitals:   09/03/13 1127  BP: 119/69  Pulse: 69  Temp: 98.7 F (37.1 C)  Resp: 18     Body mass index is 36.61 kg/(m^2).    ECOG FS: 1 Filed Weights   09/03/13 1127  Weight: 200 lb 3.2 oz (90.81 kg)   Physical Exam: HEENT:   Sclerae anicteric.  Oropharynx clear. No evidence of ulcerations [name] mucositis. Buccal mucosa is pink and moist NODES:  No cervical [name] supraclavicular lymphadenopathy palpated.  BREAST EXAM:  Patient status  post bilateral mastectomies with well-healed incisions and no evidence of local recurrence. Axillae are benign bilaterally, no palpable adenopathy. LUNGS:  Clear to auscultation bilaterally.  No wheezes [name] rhonchi HEART:  Regular rate and rhythm.  ABDOMEN:  Soft, obese, nontender.  Positive bowel sounds.  MSK:  No focal spinal tenderness to palpation. Good range of motion in the upper extremities. EXTREMITIES:  No peripheral edema.   SKIN:  Port is intact in the left upper chest wall with no erythema, edema, [name] evidence of infection.  There is a mild erythematous rash in the right axilla, no vesicles [name] pustules. NEURO:  Nonfocal. Well oriented. Positive affect.   LAB RESULTS:  Lab Results  Component Value Date   WBC 18.6* 09/03/2013   NEUTROABS 17.4* 09/03/2013   HGB 9.0* 09/03/2013   HCT 26.5* 09/03/2013   MCV 96.3 09/03/2013   PLT 391 09/03/2013      Chemistry      Component Value Date/Time   NA 139 08/20/2013 1333   NA 141 04/19/2013 0815   K 3.9 08/20/2013 1333   K 3.8 04/19/2013 0815   CL 103 05/20/2013 1404   CL 106 04/19/2013 0815   CO2 27 08/20/2013 1333   CO2 24 04/19/2013 0815   BUN 19.5 08/20/2013 1333   BUN 14 04/19/2013 0815   CREATININE 0.8 08/20/2013 1333   CREATININE 0.79 04/19/2013 0815      Component Value Date/Time   CALCIUM 9.7 08/20/2013 1333   CALCIUM 8.8 04/19/2013 0815   ALKPHOS 58 08/20/2013 1333   ALKPHOS 63 04/17/2013 0741   AST 6 08/20/2013 1333   AST 23 04/17/2013 0741   ALT 7 08/20/2013 1333   ALT 26 04/17/2013 0741   BILITOT 0.77 08/20/2013 1333   BILITOT 0.2* 04/17/2013 0741       STUDIES:  Echocardiogram on 05/01/2013 showed an ejection fraction of 55-60%.    ASSESSMENT: 52 y.o. 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 There were also 2 suspicious areas in the left breast noted by MRI.  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 Patient's testing pending  (5)  started adjuvant chemotherapy, consisting of 6 planned cycles of docetaxel/ doxorubicin/ cyclophosphamide given on a Q. three-week basis, first dose  05/21/2013.    (6)  Adjuvant chemotherapy to be followed by postmastectomy radiation therapy, then antiestrogen therapy.  (7)  Rectal fissure, stable  (8)  Anemia  (9)  Hx chemotherapy-induced neutropenia, afebrile   PLAN:  Bexlee will receive her sixth and final dose of adjuvant docetaxel/doxorubicin/cyclophosphamide today as planned. She will receive Neulasta  tomorrow, and I will see her next week on October 14 for assessment of chemotoxicity. I have refilled both her prochlorperazine and her dexamethasone per her request.  Amarachi is already  scheduled to see Dr. Dayton Scrape in 2 weeks October 21 to initiate radiation therapy. She's also in the process of getting an appointment with Dr. Odis Luster for consultation regarding possible breast reconstruction in the future.   Dinia tells me she is been using cortisone cream in her right underarm, and this seems to be improving. She will let us  know if this worsens [name] she has any additional skin changes elsewhere.  Gwendolyn voices understanding and agreement with this plan and will call with any changes [name] problems prior to her next appointment.  Lamari Youngers, PA-C   09/03/2013

## 2013-09-03 NOTE — Patient Instructions (Signed)
[facility] Discharge Instructions for Patients Receiving Chemotherapy  Today you received the following chemotherapy agents Adriamycin/Cytoxan/Taxotere.  To help prevent nausea and vomiting after your treatment, we encourage you to take your nausea medication as prescribed.   If you develop nausea and vomiting that is not controlled by your nausea medication, call the clinic.   BELOW ARE SYMPTOMS THAT SHOULD BE REPORTED IMMEDIATELY:  *FEVER GREATER THAN 100.5 F  *CHILLS WITH OR WITHOUT FEVER  NAUSEA AND VOMITING THAT IS NOT CONTROLLED WITH YOUR NAUSEA MEDICATION  *UNUSUAL SHORTNESS OF BREATH  *UNUSUAL BRUISING OR BLEEDING  TENDERNESS IN MOUTH AND THROAT WITH OR WITHOUT PRESENCE OF ULCERS  *URINARY PROBLEMS  *BOWEL PROBLEMS  UNUSUAL RASH Items with * indicate a potential emergency and should be followed up as soon as possible.  Feel free to call the clinic you have any questions or concerns. The clinic phone number is [phone].    

## 2013-09-04 ENCOUNTER — Ambulatory Visit (HOSPITAL_BASED_OUTPATIENT_CLINIC_OR_DEPARTMENT_OTHER): Payer: Medicaid Other

## 2013-09-04 VITALS — BP 124/59 | HR 63 | Temp 98.3°F

## 2013-09-04 DIAGNOSIS — Z5189 Encounter for other specified aftercare: Secondary | ICD-10-CM

## 2013-09-04 DIAGNOSIS — C50411 Malignant neoplasm of upper-outer quadrant of right female breast: Secondary | ICD-10-CM

## 2013-09-04 DIAGNOSIS — C50419 Malignant neoplasm of upper-outer quadrant of unspecified female breast: Secondary | ICD-10-CM

## 2013-09-04 MED ORDER — PEGFILGRASTIM INJECTION 6 MG/0.6ML
6.0000 mg | Freq: Once | SUBCUTANEOUS | Status: AC
  Administered 2013-09-04: 6 mg via SUBCUTANEOUS
  Filled 2013-09-04: qty 0.6

## 2013-09-09 ENCOUNTER — Encounter: Payer: Self-pay | Admitting: Physician Assistant

## 2013-09-09 ENCOUNTER — Ambulatory Visit (HOSPITAL_BASED_OUTPATIENT_CLINIC_OR_DEPARTMENT_OTHER): Payer: Medicaid Other | Admitting: Physician Assistant

## 2013-09-09 ENCOUNTER — Other Ambulatory Visit (HOSPITAL_BASED_OUTPATIENT_CLINIC_OR_DEPARTMENT_OTHER): Payer: Medicaid Other | Admitting: Lab

## 2013-09-09 ENCOUNTER — Telehealth: Payer: Self-pay | Admitting: *Deleted

## 2013-09-09 VITALS — BP 135/89 | HR 93 | Temp 98.6°F | Resp 18 | Ht 62.0 in | Wt 193.8 lb

## 2013-09-09 DIAGNOSIS — C50419 Malignant neoplasm of upper-outer quadrant of unspecified female breast: Secondary | ICD-10-CM

## 2013-09-09 DIAGNOSIS — Z9010 Acquired absence of unspecified breast and nipple: Secondary | ICD-10-CM

## 2013-09-09 DIAGNOSIS — D702 Other drug-induced agranulocytosis: Secondary | ICD-10-CM

## 2013-09-09 DIAGNOSIS — C50411 Malignant neoplasm of upper-outer quadrant of right female breast: Secondary | ICD-10-CM

## 2013-09-09 DIAGNOSIS — D649 Anemia, unspecified: Secondary | ICD-10-CM

## 2013-09-09 DIAGNOSIS — Z1501 Genetic susceptibility to malignant neoplasm of breast: Secondary | ICD-10-CM

## 2013-09-09 DIAGNOSIS — D701 Agranulocytosis secondary to cancer chemotherapy: Secondary | ICD-10-CM

## 2013-09-09 LAB — CBC WITH DIFFERENTIAL/PLATELET
Basophils Absolute: 0 10*3/uL (ref 0.0–0.1)
EOS%: 2.1 % (ref 0.0–7.0)
Eosinophils Absolute: 0 10*3/uL (ref 0.0–0.5)
HCT: 25 % — ABNORMAL LOW (ref 34.8–46.6)
HGB: 8.5 g/dL — ABNORMAL LOW (ref 11.6–15.9)
MCH: 32.6 pg (ref 25.1–34.0)
MCV: 95.8 fL (ref 79.5–101.0)
MONO%: 2.1 % (ref 0.0–14.0)
NEUT#: 0.1 10*3/uL — CL (ref 1.5–6.5)
NEUT%: 9.6 % — ABNORMAL LOW (ref 38.4–76.8)
lymph#: 0.8 10*3/uL — ABNORMAL LOW (ref 0.9–3.3)
nRBC: 0 % (ref 0–0)

## 2013-09-09 NOTE — Telephone Encounter (Signed)
appts made and printed...td 

## 2013-09-09 NOTE — Progress Notes (Signed)
Patient ID: [name] [name], female   DOB: 1961-01-04, 52 y.o.   MRN: [id number] ID: [name] OB: 29-Mar-1961  MR#: [id number]  NWG#:[id number]  PCP: Raliegh Ip, MD GYN:   SU: Ovidio Kin OTHER MD: Chipper Herb;  Romie Levee;  Dorene Grebe; Etter Sjogren  CHIEF COMPLAINT:  Bilateral Breast Cancers   HISTORY OF PRESENT ILLNESS: [name] [name] tells me she has not been able to have mammography for several years because of cost issues. More recently, she had noted a mass in her right breast but thought this might be MRSA. The urinary tract infection took her to her physician's office, and she brought the breast mass to the attention of [name] Pisciotta NP. She set up the patient for bilateral diagnostic mammography and right breast ultrasound at the [facility] 03/27/2013, which showed a 4 cm irregular mass in the upper outer right breast, with a 9 mm cluster of calcifications in the posterior lower right breast and a few mildly enlarged level I right axillary lymph nodes. The breast mass was palpable and ultrasound showed it to measure 4.1 cm, with some of the level I right axillary lymph nodes noted to have lost their fatty hilum.  Biopsy of both the suspicious areas in the right breast as well as one of the suspicious lymph nodes was performed 03/27/2013 and showed ([id number]) the larger breast mass to consist of an invasive ductal carcinoma with lobular features, grade 2, estrogen and progesterone receptor both 100% positive, with an MIB-1 of 15%, and no HER-2 amplification. 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 The sampled right axillary lymph node was also positive.  Bilateral breast MRI 04/01/2013 showed the larger right breast mass to measure 5.4 cm, with a satellite nodule immediately inferior to it measuring 8 mm. 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 There were multiple  enlarged right axillary lymph nodes, the largest measuring 2.8 cm. In addition, in the left breast there were 2 foci on non-masslike enhancement consistent with DCIS measuring 1.3 cm and 2.2 cm. These weren't different quadrants.  The patient's subsequent history is as noted below  INTERVAL HISTORY: [name] [name] returns today accompanied by her daughter [name] [name] for followup of her bilateral breast cancers. She is currently day 7 cycle 6 of 6 planned q. three-week dose of adjuvant docetaxel/doxorubicin/cyclophosphamide today. She receives Neulasta on day 2 for granulocyte support.  Mahealani is feeling poorly today, is very fatigued, and actually presents in a wheelchair. Her energy level is low. She has shortness of breath with exertion. Her muscles ache. She denies any abnormal bleeding, but continues to have a decreasing hemoglobin, down to 8.5 today.   Otherwise, Katalea is very happy to have completed all of her adjuvant chemotherapy. She has some occasional nausea for which she takes her antinausea medications appropriately and affectively. She's had no actual emesis. She's having regular bowel movements. She has mild rectal pain secondary to her rectal fissure, but has had no rectal bleeding.   REVIEW OF SYSTEMS: Elaynah continues to deny any signs of numbness in her upper or lower extremities. She has had no fevers or chills. She denies any recent rashes or skin changes and has had no abnormal bruising.  She denies any mouth ulcers, oral sensitivity, or problems swallowing.   She denies any dysuria or hematuria.  She denies any increased cough or phlegm production.  She's had no  abnormal headaches, change in vision or dizziness. She does have continued anxiety and depression which is stable. She denies suicidal ideation. She currently denies any unusual arthralgias, or bony pain. She has occasional swelling in her feet and ankles, although this is not a problem today.   A detailed review of systems is otherwise  stable and noncontributory.   PAST MEDICAL HISTORY: Past Medical History  Diagnosis Date  . Hypertension   . Anxiety   . Depression   . Headache(784.0)   . Sleep apnea     cpcp  . Shortness of breath   . Breast cancer     PAST SURGICAL HISTORY: Past Surgical History  Procedure Laterality Date  . Mastectomy, radical Right 04/08/2013  . Simple mastectomy with axillary sentinel node biopsy Left 04/08/2013  . Mastectomy modified radical Right 04/07/2013    Procedure: RIGHT MASTECTOMY MODIFIED RADICAL;  Surgeon: Kandis Cocking, MD;  Location: [facility] OR;  Service: General;  Laterality: Right;  . Simple mastectomy with axillary sentinel node biopsy Left 04/07/2013    Procedure:  LEFT SMPLE MASTECTOMY WITH AXILLARY SENTINEL NODE BIOPSY;  Surgeon: Kandis Cocking, MD;  Location: [facility] OR;  Service: General;  Laterality: Left;  . Portacath placement Left 04/07/2013    Procedure: INSERTION PORT-A-CATH;  Surgeon: Kandis Cocking, MD;  Location: [facility] OR;  Service: General;  Laterality: Left;    FAMILY HISTORY Family History  Problem Relation Age of Onset  . Breast cancer Mother 22  . Breast cancer Maternal Grandmother 45  . Prostate cancer Paternal Grandfather 50   the patient's father died at age 36 in an airplane crash (he was a Transport planner unsupervised). The patient's mother is alive at age 71. Her name is [name] and she works for [employer]. [name] has a history of breast cancer diagnosed at age 18. She had one sister who is alive and well. [name]'s mother, the patient's grandmother, was diagnosed with breast cancer at age 29. She had 2 sisters, neither with breast cancer. Moniqua herself had one brother, no sisters. There is no other history of breast or ovary cancer in the family.  GYNECOLOGIC HISTORY:  Menarche age 37, she is GX P35, first live birth age 55. The patient was taking birth control pills until September 2013. She stopped having periods at that time, but had one in late March  2014.  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 Her husband Gelena Klosinski (goes by "[name]") is disabled secondary to diabetes. They were separated for the past 2 years, but [name] recently moved back in with [place]. Carlisle lives with her mother [name] and 8 poodles. The patient's daughter [name] "[name] [name]" Proffit is 39 and also lives with the patient.    ADVANCED DIRECTIVES: In place. The patient has named her daughter [name] [name] as her healthcare power of attorney, bypassing her husband.  HEALTH MAINTENANCE: History  Substance Use Topics  . Smoking status: Never Smoker   . Smokeless tobacco: Never Used  . Alcohol Use: No     Colonoscopy: Never  PAP: Possibly 2006  Bone density: Possibly 2004  Lipid panel:  Allergies  Allergen Reactions  . Vicodin [Hydrocodone-Acetaminophen] Anaphylaxis  . Codeine Nausea And Vomiting  . Penicillins Rash  . Percocet [Oxycodone-Acetaminophen] Itching  . Septra [Bactrim] Other (See Comments)    Heart races    Current Outpatient Prescriptions  Medication Sig Dispense Refill  . acetaminophen (TYLENOL) 500 MG tablet Take 2 tablets (  1,000 mg total) by mouth every 6 (six) hours as needed (Maximum 8 tabs in 24 hours). For pain  50 tablet  1  . ALPRAZolam (XANAX) 0.25 MG tablet Take 1 tablet by mouth Three times a day.      . ciprofloxacin (CIPRO) 500 MG tablet Take 1 tablet (500 mg total) by mouth 2 (two) times daily.  14 tablet  3  . dexamethasone (DECADRON) 4 MG tablet 2 tabs by mouth twice daily with food on day before chemo and 3 days after chemo  30 tablet  0  . diltiazem 2 % GEL Apply 1 application topically 2 (two) times daily.  30 g  1  . docusate sodium 100 MG CAPS Take 200 mg by mouth 2 (two) times daily.  10 capsule  0  . doxycycline (VIBRA-TABS) 100 MG tablet Take 1 tablet (100 mg total) by mouth 2 (two) times daily.  14 tablet  2  . escitalopram (LEXAPRO) 10 MG tablet Take 1 tablet by mouth  daily.      Marland Kitchen lidocaine-prilocaine (EMLA) cream Apply topically as needed. 1-2 hours before each chemo  30 g  1  . loratadine (CLARITIN) 10 MG tablet 1 tablet by mouth once daily for 5 days after each dose of chemo  30 tablet  0  . meloxicam (MOBIC) 15 MG tablet Take 15 mg by mouth daily.      Marland Kitchen omeprazole (PRILOSEC) 20 MG capsule Take 1 capsule (20 mg total) by mouth 2 (two) times daily.  60 capsule  1  . ondansetron (ZOFRAN) 4 MG tablet Take 4 mg by mouth every 8 (eight) hours as needed for nausea.      . prochlorperazine (COMPAZINE) 10 MG tablet 1 tab by mouth with meals and bedtime x 3 days after chemo, then every 6 hrs as needed for nausea  30 tablet  0  . tobramycin-dexamethasone (TOBRADEX) ophthalmic solution Place 1 drop into both eyes 2 (two) times daily. For 7 days after each dose of chemo  5 mL  1  . traMADol (ULTRAM) 50 MG tablet Take 1 tablet (50 mg total) by mouth every 8 (eight) hours as needed for pain.  30 tablet  0  . triamterene-hydrochlorothiazide (MAXZIDE-25) 37.5-25 MG per tablet Take 1 tablet by mouth daily.       No current facility-administered medications for this visit.   Facility-Administered Medications Ordered in Other Visits  Medication Dose Route Frequency Provider Last Rate Last Dose  . sodium chloride 0.9 % injection 10 mL  10 mL Intracatheter PRN Laurine Kuyper Allegra Grana, PA-C        OBJECTIVE:   Middle-aged white female who appears her stated age, presents in a wheelchair today and appears very fatigued  Filed Vitals:   09/09/13 1425  BP: 135/89  Pulse: 93  Temp: 98.6 F (37 C)  Resp: 18     Body mass index is 35.44 kg/(m^2).    ECOG FS: 1 Filed Weights   09/09/13 1425  Weight: 193 lb 12.8 oz (87.907 kg)   Physical Exam: HEENT:  Sclerae anicteric.  Oropharynx clear. No evidence of ulcerations or mucositis.  NODES:  No cervical or supraclavicular lymphadenopathy palpated.  BREAST EXAM:  Patient status post bilateral mastectomies. Axillae are benign  bilaterally, no palpable adenopathy. LUNGS:  Clear to auscultation bilaterally.  No wheezes or rhonchi HEART:  Regular rate and rhythm.  ABDOMEN:  Soft, obese, nontender.  Positive bowel sounds.  MSK:  No focal spinal tenderness to palpation.  Good range of motion in the upper extremities. EXTREMITIES:  No peripheral edema.   SKIN:  Port is intact in the left upper chest wall with no erythema, edema, or evidence of infection.    NEURO:  Nonfocal. Well oriented. Fatigued affect.   LAB RESULTS:  Lab Results  Component Value Date   WBC 0.9* 09/09/2013   NEUTROABS 0.1* 09/09/2013   HGB 8.5* 09/09/2013   HCT 25.0* 09/09/2013   MCV 95.8 09/09/2013   PLT 119* 09/09/2013      Chemistry      Component Value Date/Time   NA 139 08/20/2013 1333   NA 141 04/19/2013 0815   K 3.9 08/20/2013 1333   K 3.8 04/19/2013 0815   CL 103 05/20/2013 1404   CL 106 04/19/2013 0815   CO2 27 08/20/2013 1333   CO2 24 04/19/2013 0815   BUN 19.5 08/20/2013 1333   BUN 14 04/19/2013 0815   CREATININE 0.8 08/20/2013 1333   CREATININE 0.79 04/19/2013 0815      Component Value Date/Time   CALCIUM 9.7 08/20/2013 1333   CALCIUM 8.8 04/19/2013 0815   ALKPHOS 58 08/20/2013 1333   ALKPHOS 63 04/17/2013 0741   AST 6 08/20/2013 1333   AST 23 04/17/2013 0741   ALT 7 08/20/2013 1333   ALT 26 04/17/2013 0741   BILITOT 0.77 08/20/2013 1333   BILITOT 0.2* 04/17/2013 0741       STUDIES:  Echocardiogram on 05/01/2013 showed an ejection fraction of 55-60%.    ASSESSMENT: 52 y.o. 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 There were also 2 suspicious areas in the left breast noted by MRI.  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 Patient's testing pending  (5)  started adjuvant chemotherapy, consisting of 6 planned cycles of docetaxel/ doxorubicin/ cyclophosphamide given on a Q. three-week basis, first dose  05/21/2013.    (6)  Adjuvant chemotherapy to be followed by postmastectomy radiation therapy, then antiestrogen therapy.  (7)  Rectal fissure, stable  (8)  Anemia  (9)  Chemotherapy-induced neutropenia, afebrile   PLAN:  Juanette has finally completed her 6 cycles of adjuvant chemotherapy. She is scheduled to see Dr. Dayton Scrape next week on October 21 to discuss radiation therapy. I am also going to recheck her CBC that day, and we certainly can consider a blood transfusion if her hemoglobin continues to drop, or if she becomes more symptomatic. As of today, Catherin declines a blood transfusion and wants to "give it a little more time".    We again reviewed neutropenic precautions. She is starting back on Cipro prophylactically, 500 mg by mouth twice a day for 7 days. I reminded her to call us immediately with any fevers 100 or above. Of course we  will recheck her white count and neutrophil counts when she returns next week as well.  Ameyah will return to see Dr. Darnelle Catalan in early December for repeat labs and physical exam. That time she should be close to finishing radiation therapy, and they will be discussing antiestrogen therapy, likely tamoxifen since she has had a period as recent as March 2014.  We reviewed the above plan today, and Kinisha and her  daughter both voice their understanding and agreement. They know to call with any changes prior to her next appointment.   Ashten Sarnowski, PA-C   09/09/2013

## 2013-09-11 ENCOUNTER — Encounter: Payer: Self-pay | Admitting: Radiation Oncology

## 2013-09-11 NOTE — Progress Notes (Signed)
Location of Breast Cancer: right upper outer  Histology per Pathology Report:  03/27/13 Diagnosis 1. Breast, right, needle core biopsy, mass, 10 o'clock upper outer - INVASIVE MAMMARY CARCINOMA, SEE COMMENT. - MAMMARY CARCINOMA IN SITU 2. Breast, right, needle core biopsy, right axilla - ONE LYMPH NODE, POSITIVE FOR METASTATIC MAMMARY CARCINOMA (1/1). 3. Breast, right, needle core biopsy, lower - DUCTAL CARCINOMA IN SITU, SEE COMMENT.  04/11/13 Diagnosis 1. Lymph node, sentinel, biopsy, Left axillary #1 - THERE IS NO EVIDENCE OF CARCINOMA IN 1 OF 1 LYMPH NODE (0/1). 2. Breast, simple mastectomy, Left - LOBULAR NEOPLASIA (ATYPICAL LOBULAR HYPERPLASIA). - FIBROCYSTIC CHANGES WITH CALCIFICATIONS. - RADIAL SCAR WITH CALCIFICATIONS. 3. Skin , Left breast superior flap - MELANOCYTIC NEVUS. - NO SIGNIFICANT CYTOLOGIC DYSPLASTIC FEATURES ARE IDENTIFIED. 4. Skin , Left breast inferior flap - BENIGN SKIN. 5. Breast, modified radical mastectomy , Right - INVASIVE MAMMARY CARCINOMA WITH PREDOMINANTLY LOBULAR FEATURES, GRADE II/III, SPANNING AT LEAST 4.4 CM. - METASTATIC CARCINOMA IN 9 OF 18 LYMPH NODES (9/18) WITH EXTRACAPSULAR EXTENSION. - THE SURGICAL RESECTION MARGINS ARE NEGATIVE FOR CARCINOMA. - SEE ONCOLOGY TABLE BELOW 6. Breast, excision, Behind right pectoralis muscle - THERE IS NO EVIDENCE OF CARCINOMA IN 1 OF 1 LYMPH NODE (0/1). 7. Skin , Right breast superior flap - ACROCHORDON(S). 8. Skin , Superior portion of inferior flap - SEBORRHEIC KERATOSIS. 9. Skin , Lateral fold right breast - BENIGN SKIN.  Receptor Status: ER(100%), PR (100%), Her2-neu (-)  Did patient present with symptoms (if so, please note symptoms) or was this found on screening mammography? Pt noted a mass in right breast  Past/Anticipated interventions by surgeon, if any: 04/11/13  right mod radical mastectomy, left simple mastectomy  Past/Anticipated interventions by medical oncology, if any: Chemotherapy  : started adjuvant chemotherapy, consisting of 6 planned cycles of docetaxel/ doxorubicin/ cyclophosphamide given on a Q. three-week basis, first dose 05/21/2013. Last dose 09/03/13. Adjuvant chemotherapy to be followed by postmastectomy radiation therapy, then antiestrogen therapy.  Lymphedema issues, if any:  Slight swelling of right hand at times  Pain issues, if any:    SAFETY ISSUES:  Prior radiation? no  Pacemaker/ICD? No  Possible current pregnancy? no  Is the patient on methotrexate? no  Current Complaints / other details:  Separated, 1 daughter lives w/pt, daughter age 61. 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 Labs drawn today to check hgb per pt. She c/o weakness, fatigue, loss of appetite, SOB w/minimal exertion. She denies pain w/mastectomies, slight swelling of right hand at times.    Glennie Hawk, RN 09/11/2013,1:02 PM

## 2013-09-16 ENCOUNTER — Ambulatory Visit
Admission: RE | Admit: 2013-09-16 | Discharge: 2013-09-16 | Disposition: A | Discharge status: Home / Self Care | Payer: Medicaid Other | Source: Ambulatory Visit | Attending: Radiation Oncology | Admitting: Radiation Oncology

## 2013-09-16 ENCOUNTER — Encounter: Payer: Self-pay | Admitting: Radiation Oncology

## 2013-09-16 ENCOUNTER — Other Ambulatory Visit (HOSPITAL_BASED_OUTPATIENT_CLINIC_OR_DEPARTMENT_OTHER): Payer: Medicaid Other | Admitting: Lab

## 2013-09-16 VITALS — BP 128/88 | HR 81 | Temp 98.8°F | Resp 20 | Wt 189.3 lb

## 2013-09-16 DIAGNOSIS — C50919 Malignant neoplasm of unspecified site of unspecified female breast: Secondary | ICD-10-CM | POA: Insufficient documentation

## 2013-09-16 DIAGNOSIS — D701 Agranulocytosis secondary to cancer chemotherapy: Secondary | ICD-10-CM

## 2013-09-16 DIAGNOSIS — C50419 Malignant neoplasm of upper-outer quadrant of unspecified female breast: Secondary | ICD-10-CM

## 2013-09-16 DIAGNOSIS — C50411 Malignant neoplasm of upper-outer quadrant of right female breast: Secondary | ICD-10-CM

## 2013-09-16 DIAGNOSIS — Z9221 Personal history of antineoplastic chemotherapy: Secondary | ICD-10-CM | POA: Insufficient documentation

## 2013-09-16 DIAGNOSIS — Z9010 Acquired absence of unspecified breast and nipple: Secondary | ICD-10-CM | POA: Insufficient documentation

## 2013-09-16 DIAGNOSIS — D649 Anemia, unspecified: Secondary | ICD-10-CM

## 2013-09-16 DIAGNOSIS — R5381 Other malaise: Secondary | ICD-10-CM | POA: Insufficient documentation

## 2013-09-16 LAB — CBC WITH DIFFERENTIAL/PLATELET
BASO%: 0.4 % (ref 0.0–2.0)
Basophils Absolute: 0.1 10*3/uL (ref 0.0–0.1)
Eosinophils Absolute: 0 10*3/uL (ref 0.0–0.5)
HCT: 26.1 % — ABNORMAL LOW (ref 34.8–46.6)
HGB: 9.1 g/dL — ABNORMAL LOW (ref 11.6–15.9)
LYMPH%: 14 % (ref 14.0–49.7)
MCHC: 34.9 g/dL (ref 31.5–36.0)
MCV: 94.9 fL (ref 79.5–101.0)
MONO#: 0.6 10*3/uL (ref 0.1–0.9)
NEUT#: 11.5 10*3/uL — ABNORMAL HIGH (ref 1.5–6.5)
NEUT%: 81.3 % — ABNORMAL HIGH (ref 38.4–76.8)
Platelets: 223 10*3/uL (ref 145–400)
RBC: 2.75 10*6/uL — ABNORMAL LOW (ref 3.70–5.45)
WBC: 14.1 10*3/uL — ABNORMAL HIGH (ref 3.9–10.3)
lymph#: 2 10*3/uL (ref 0.9–3.3)

## 2013-09-16 LAB — HOLD TUBE, BLOOD BANK

## 2013-09-16 NOTE — Progress Notes (Signed)
Please see the Nurse Progress Note in the MD Initial Consult Encounter for this patient. 

## 2013-09-16 NOTE — Progress Notes (Signed)
Pt circled 8.5 on distress screen. Called L Mullis SW and left vm giving her information re: pt's score. Requested she contact pt by phone to discuss her distress screen.

## 2013-09-16 NOTE — Progress Notes (Addendum)
[facility] Radiation Oncology Follow up Note  Name: [name] [name]   Date:   09/16/2013 MRN:  [id number] DOB: [birth year]   CC:  Raliegh Ip, MD  Magrinat, Valentino Hue, MD, Dr. Ovidio Kin  DIAGNOSIS: Pathologic stage IIIA (T2 N2a M0) invasive lobular carcinoma of the right breast     ALLERGIES: Vicodin; Codeine; Penicillins; Percocet; and Septra   MEDICATIONS:  Current Outpatient Prescriptions  Medication Sig Dispense Refill  . acetaminophen (TYLENOL) 500 MG tablet Take 2 tablets (1,000 mg total) by mouth every 6 (six) hours as needed (Maximum 8 tabs in 24 hours). For pain  50 tablet  1  . ALPRAZolam (XANAX) 0.25 MG tablet Take 1 tablet by mouth Three times a day.      . ciprofloxacin (CIPRO) 500 MG tablet Take 1 tablet (500 mg total) by mouth 2 (two) times daily.  14 tablet  3  . dexamethasone (DECADRON) 4 MG tablet 2 tabs by mouth twice daily with food on day before chemo and 3 days after chemo  30 tablet  0  . diltiazem 2 % GEL Apply 1 application topically 2 (two) times daily.  30 g  1  . docusate sodium 100 MG CAPS Take 200 mg by mouth 2 (two) times daily.  10 capsule  0  . escitalopram (LEXAPRO) 10 MG tablet Take 1 tablet by mouth daily.      Marland Kitchen lidocaine-prilocaine (EMLA) cream Apply topically as needed. 1-2 hours before each chemo  30 g  1  . loratadine (CLARITIN) 10 MG tablet 1 tablet by mouth once daily for 5 days after each dose of chemo  30 tablet  0  . meloxicam (MOBIC) 15 MG tablet Take 15 mg by mouth daily.      Marland Kitchen omeprazole (PRILOSEC) 20 MG capsule Take 1 capsule (20 mg total) by mouth 2 (two) times daily.  60 capsule  1  . ondansetron (ZOFRAN) 4 MG tablet Take 4 mg by mouth every 8 (eight) hours as needed for nausea.      . prochlorperazine (COMPAZINE) 10 MG tablet 1 tab by mouth with meals and bedtime x 3 days after chemo, then every 6 hrs as needed for nausea  30 tablet  0  . tobramycin-dexamethasone (TOBRADEX) ophthalmic solution Place 1 drop into  both eyes 2 (two) times daily. For 7 days after each dose of chemo  5 mL  1  . traMADol (ULTRAM) 50 MG tablet Take 1 tablet (50 mg total) by mouth every 8 (eight) hours as needed for pain.  30 tablet  0  . triamterene-hydrochlorothiazide (MAXZIDE-25) 37.5-25 MG per tablet Take 1 tablet by mouth daily.       No current facility-administered medications for this encounter.   Facility-Administered Medications Ordered in Other Encounters  Medication Dose Route Frequency Provider Last Rate Last Dose  . sodium chloride 0.9 % injection 10 mL  10 mL Intracatheter PRN Amy Allegra Grana, PA-C         NARRATIVE: Ms. Mapel is a pleasant 52 year old female who is seen today for review and scheduling of her post mastectomy radiation therapy in the management of her T2 N2a M0 invasive lobular carcinoma the right breast. She first noted a mass along the upper-outer quadrant of the right breast a few months ago. This was brought to the attention of an emergency room physician when she was recently seen for a urinary tract infection. She was advised to undergo mammography. A mammogram at the [facility]  on 03/27/2013 showed a 3.5 by 4.0 cm mass with distortion within the upper-outer quadrant of the right breast. There were a few mildly enlarged lower level I right axillary lymph nodes. There is also a 3 x 9 mm cluster of calcifications within the posterior lower right breast felt to be indeterminate. Ultrasound showed a 3.9 x 2.0 x 4.1 cm mass at 10:00 within the right breast, 6-7 cm from the nipple. There were a few mildly enlarged lower level I right axillary lymph nodes that had lost their fatty hilum. She underwent an ultrasound-guided biopsy of the right breast mass on 03/27/2013 along with an abnormal lymph node at 11:00 position of the right breast. These were diagnostic for invasive mammary carcinoma along with DCIS. A stereotactic biopsy of the calcifications at 6:00 was diagnostic for low-grade DCIS. Breast MR on  04/01/2013 showed a 5.4 cm mass within the upper-outer quadrant of the right breast along with a 0.8 cm satellite nodule just inferior and lateral to the dominant mass in the right breast. There was a 2.8 cm focus of non- masslike enhancement in the posterior third of the right breast corresponding to the biopsy-proven DCIS. There was felt to be pathologic right axillary lymphadenopathy. In addition, there were 2 foci of non- masslike enhancement with a left breast at the 7:00 position, 8 cm from her nipple, at the 5:00 position, 12 cm from the nipple. Her staging workup included a PET scan on 04/04/2013 which showed a right breast mass and hypermetabolic right axillary adenopathy. There was no evidence for distant metastases. She went on to receive 6 cycles of adjuvant chemotherapy under the direction of Dr. Darnelle Catalan. Her major complaint is that of fatigue. He HGB fell to a low of 8.5, and is 9.1 this morning. Genetic testing was negative in that she did not have a BRCA1/2 mutation. She does have a variant of uncertain significance . She seen today with her daughter.    PHYSICAL EXAM:   weight is 189 lb 4.8 oz (85.866 kg). Her temperature is 98.8 F (37.1 C). Her blood pressure is 128/88 and her pulse is 81. Her respiration is 20.  Alert and oriented. Head and neck examination: Remarkable for alopecia. Nodes: Without palpable cervical, supraclavicular, or axillary lymphadenopathy. Chest: Bilateral mastectomies without palpable or visible evidence for recurrent disease along the right chest wall. Lungs are clear. Heart: Regular rate and rhythm abdomen: Without hepatomegaly extremities: Without edema.   LABORATORY DATA:  Lab Results  Component Value Date   WBC 14.1* 09/16/2013   HGB 9.1* 09/16/2013   HCT 26.1* 09/16/2013   MCV 94.9 09/16/2013   PLT 223 09/16/2013   Lab Results  Component Value Date   NA 139 08/20/2013   K 3.9 08/20/2013   CL 103 05/20/2013   CO2 27 08/20/2013   Lab Results   Component Value Date   ALT 7 08/20/2013   AST 6 08/20/2013   ALKPHOS 58 08/20/2013   BILITOT 0.77 08/20/2013      IMPRESSION: Pathologic stage IIIA (T2 N2 M0) invasive lobular carcinoma of the right breast. Based on her degree of nodal positivity, I do recommend post mastectomy radiation therapy. We discussed the potential acute and late toxicities of radiation therapy. Consent is signed today.  PLAN:  as discussed above. I will have her return next week for simulation/treatment planning.   I spent 30 minutes minutes face to face with the patient and more than 50% of that time was spent in counseling and/or  coordination of care.

## 2013-09-18 NOTE — Progress Notes (Signed)
[facility] Psychosocial Distress Screening Clinical Social Work  Clinical Social Work was referred by distress screening protocol.  The patient scored a 8 on the Psychosocial Distress Thermometer which indicates severe distress. Clinical Social Worker Intern telephoned to assess for distress and other psychosocial needs. CSWI called but got voice mail.  CSWI left message for Patient to return call when convenient.   Clinical Social Worker follow up needed: yes  If yes, follow up plan: Return call to Patient, if Patient does not follow up.   Rafal Archuleta S. [facility] Clinical Social Work Intern [employer] [phone]

## 2013-09-24 ENCOUNTER — Ambulatory Visit
Admission: RE | Admit: 2013-09-24 | Discharge: 2013-09-24 | Disposition: A | Discharge status: Home / Self Care | Payer: Medicaid Other | Source: Ambulatory Visit | Attending: Radiation Oncology | Admitting: Radiation Oncology

## 2013-09-24 DIAGNOSIS — C50911 Malignant neoplasm of unspecified site of right female breast: Secondary | ICD-10-CM

## 2013-09-24 DIAGNOSIS — K5900 Constipation, unspecified: Secondary | ICD-10-CM | POA: Insufficient documentation

## 2013-09-24 DIAGNOSIS — C50919 Malignant neoplasm of unspecified site of unspecified female breast: Secondary | ICD-10-CM | POA: Insufficient documentation

## 2013-09-24 DIAGNOSIS — C771 Secondary and unspecified malignant neoplasm of intrathoracic lymph nodes: Secondary | ICD-10-CM | POA: Insufficient documentation

## 2013-09-24 DIAGNOSIS — L819 Disorder of pigmentation, unspecified: Secondary | ICD-10-CM | POA: Insufficient documentation

## 2013-09-24 DIAGNOSIS — L538 Other specified erythematous conditions: Secondary | ICD-10-CM | POA: Insufficient documentation

## 2013-09-24 DIAGNOSIS — Z510 Encounter for antineoplastic radiation therapy: Secondary | ICD-10-CM | POA: Insufficient documentation

## 2013-09-24 DIAGNOSIS — B358 Other dermatophytoses: Secondary | ICD-10-CM | POA: Insufficient documentation

## 2013-09-24 NOTE — Progress Notes (Signed)
Complex simulation/treatment planning note: The patient was taken to the CT simulator. She was placed on a custom breast board with construction of a custom neck mold for immobilization. I marked her right chest wall field borders with radiopaque wires. Also marked her right mastectomy scar. She was then scanned. She was set up to medial and lateral right chest wall tangents. 2 separate multileaf collimators were designed. That she was set up LAO and a separate set of multileaf collimators were designed to conform the field. Thus, she had construction of four complex treatment devices which include 3 MLCs. I prescribing 5040 cGy to both the supraclavicular region and right chest wall. The supraclavicular/axillary field will be prescribing 3 cm step with 10 MV photons. After completion of her tangential photon irradiation she will have a right chest wall/mastectomy scar boost for an additional 1000 cGy in 5 sessions with 6 MEV electrons. On the first day of her treatment she'll have construction of 1.0 cm custom bolus which will be applied to the right chest wall every other day to maximize the dose to the skin surface.

## 2013-10-01 ENCOUNTER — Ambulatory Visit
Admission: RE | Admit: 2013-10-01 | Discharge: 2013-10-01 | Disposition: A | Discharge status: Home / Self Care | Payer: Medicaid Other | Source: Ambulatory Visit | Attending: Radiation Oncology | Admitting: Radiation Oncology

## 2013-10-01 DIAGNOSIS — C50911 Malignant neoplasm of unspecified site of right female breast: Secondary | ICD-10-CM

## 2013-10-01 NOTE — Progress Notes (Signed)
Simulation verification note: The patient underwent simulation verification for treatment to her right chest wall and regional lymph nodes. Her isocenter was in good position and the multileaf collimators contoured the treatment volume appropriately.

## 2013-10-01 NOTE — Progress Notes (Signed)
Pt in nursing after port films with question re: right axilla. Pt has round dry reddened area that appears to have peeled. She states she has "had this problem off and on ever since her surgery". Pt denies pain, itching, burning, irritation of this area. She states she uses aerosol antiperspirant. Pt has no history of yeast infections of the skin. Advised she not use her antiperspirant and apply Gold[employer] powder or corn starch today and tomorrow then come see RN Fri. to have this area checked again. Informed her she will see Dr Dayton Scrape next Monday. Pt and daughter verbalized agreement and understanding of above instructions.

## 2013-10-02 ENCOUNTER — Ambulatory Visit
Admission: RE | Admit: 2013-10-02 | Discharge: 2013-10-02 | Disposition: A | Discharge status: Home / Self Care | Payer: Medicaid Other | Source: Ambulatory Visit | Attending: Radiation Oncology | Admitting: Radiation Oncology

## 2013-10-03 ENCOUNTER — Ambulatory Visit
Admission: RE | Admit: 2013-10-03 | Discharge: 2013-10-03 | Disposition: A | Discharge status: Home / Self Care | Payer: Medicaid Other | Source: Ambulatory Visit | Attending: Radiation Oncology | Admitting: Radiation Oncology

## 2013-10-03 ENCOUNTER — Telehealth: Payer: Self-pay | Admitting: *Deleted

## 2013-10-03 NOTE — Telephone Encounter (Signed)
Called pt re: skin in right axilla. She states "it is about the same". She states she is not applying anything to this area, stating "I couldn't afford to buy anything". Advised pt to leave the area open to air whenever she can and to tell radiation therapists today if she would like to see a dr. Also reminded her that Dr Dayton Scrape will see her Monday after treatment. Pt verbalized understanding.

## 2013-10-06 ENCOUNTER — Ambulatory Visit
Admission: RE | Admit: 2013-10-06 | Discharge: 2013-10-06 | Disposition: A | Discharge status: Home / Self Care | Payer: Medicaid Other | Source: Ambulatory Visit | Attending: Radiation Oncology | Admitting: Radiation Oncology

## 2013-10-06 ENCOUNTER — Encounter: Payer: Self-pay | Admitting: Radiation Oncology

## 2013-10-06 VITALS — BP 153/93 | HR 99 | Temp 98.6°F | Resp 20 | Wt 192.3 lb

## 2013-10-06 DIAGNOSIS — C50411 Malignant neoplasm of upper-outer quadrant of right female breast: Secondary | ICD-10-CM

## 2013-10-06 MED ORDER — RADIAPLEXRX EX GEL
Freq: Once | CUTANEOUS | Status: AC
  Administered 2013-10-06: 11:00:00 via TOPICAL

## 2013-10-06 NOTE — Progress Notes (Addendum)
Pt denies pain, reports fatigue and states "I may be depressed." Advised pt we have counselors she may see; and she needs to let us know if she would like to see one. Pt continues to have redness in right axilla, no change in this area since last seen by this RN on 10/02/13. Pt states she has not been shaving or applying anything to her right axilla, and states she smells an odor. No odor noted this morning.  Post sim completed w/pt.  Gave pt "Radiation and You" booklet w/all pertinent information marked and discussed, re: fatigue, skin irritation/care, nutrition, pain. Gave pt Radiaplex lotion w/instructions for proper use. Did not give pt Alra due to skin irritation in right axilla. Pt verbalized understanding. Per Dr Dayton Scrape, gave pt Miconazole powder; Dr Dayton Scrape instructed pt on proper use.

## 2013-10-06 NOTE — Addendum Note (Signed)
Encounter addended by: Glennie Hawk, RN on: 10/06/2013 11:00 AM<BR>     Documentation filed: Inpatient MAR

## 2013-10-06 NOTE — Addendum Note (Signed)
Encounter addended by: Glennie Hawk, RN on: 10/06/2013 10:58 AM<BR>     Documentation filed: Inpatient Patient Education, Notes Section, Orders

## 2013-10-06 NOTE — Progress Notes (Signed)
Weekly Management Note:  Site: Right chest wall and regional lymph nodes Current Dose:  540  cGy Projected Dose: 5040  cGy  Narrative: The patient is seen today for routine under treatment assessment. CBCT/MVCT images/port films were reviewed. The chart was reviewed.   She is having right axillary discomfort. She has not applied Radioplex gel.  Physical Examination:  Filed Vitals:   10/06/13 1030  BP: 153/93  Pulse: 99  Temp: 98.6 F (37 C)  Resp: 20  .  Weight: 192 lb 4.8 oz (87.227 kg). There is erythema along the right axilla which has the appearance of a fungal skin reaction. No radiation skin changes.  Impression: Tolerating radiation therapy well. Other that she does have a fungal infection along her right axilla. We'll start her on miconazole powder 3 times a day.  Plan: Continue radiation therapy as planned.

## 2013-10-07 ENCOUNTER — Ambulatory Visit
Admission: RE | Admit: 2013-10-07 | Discharge: 2013-10-07 | Disposition: A | Discharge status: Home / Self Care | Payer: Medicaid Other | Source: Ambulatory Visit | Attending: Radiation Oncology | Admitting: Radiation Oncology

## 2013-10-08 ENCOUNTER — Encounter (HOSPITAL_COMMUNITY): Payer: Self-pay | Admitting: Emergency Medicine

## 2013-10-08 ENCOUNTER — Ambulatory Visit
Admission: RE | Admit: 2013-10-08 | Discharge: 2013-10-08 | Disposition: A | Discharge status: Home / Self Care | Payer: Medicaid Other | Source: Ambulatory Visit | Attending: Radiation Oncology | Admitting: Radiation Oncology

## 2013-10-08 ENCOUNTER — Emergency Department (HOSPITAL_COMMUNITY)
Admission: EM | Admit: 2013-10-08 | Discharge: 2013-10-08 | Disposition: A | Discharge status: Home / Self Care | Payer: Medicaid Other | Attending: Emergency Medicine | Admitting: Emergency Medicine

## 2013-10-08 ENCOUNTER — Emergency Department (HOSPITAL_COMMUNITY): Payer: Medicaid Other

## 2013-10-08 DIAGNOSIS — F3289 Other specified depressive episodes: Secondary | ICD-10-CM | POA: Insufficient documentation

## 2013-10-08 DIAGNOSIS — G4730 Sleep apnea, unspecified: Secondary | ICD-10-CM | POA: Insufficient documentation

## 2013-10-08 DIAGNOSIS — I10 Essential (primary) hypertension: Secondary | ICD-10-CM | POA: Insufficient documentation

## 2013-10-08 DIAGNOSIS — F411 Generalized anxiety disorder: Secondary | ICD-10-CM | POA: Insufficient documentation

## 2013-10-08 DIAGNOSIS — Z79899 Other long term (current) drug therapy: Secondary | ICD-10-CM | POA: Insufficient documentation

## 2013-10-08 DIAGNOSIS — Z880 Allergy status to penicillin: Secondary | ICD-10-CM | POA: Insufficient documentation

## 2013-10-08 DIAGNOSIS — Z9981 Dependence on supplemental oxygen: Secondary | ICD-10-CM | POA: Insufficient documentation

## 2013-10-08 DIAGNOSIS — F329 Major depressive disorder, single episode, unspecified: Secondary | ICD-10-CM | POA: Insufficient documentation

## 2013-10-08 DIAGNOSIS — K6289 Other specified diseases of anus and rectum: Secondary | ICD-10-CM | POA: Insufficient documentation

## 2013-10-08 LAB — BASIC METABOLIC PANEL
BUN: 13 mg/dL (ref 6–23)
Creatinine, Ser: 1.5 mg/dL — ABNORMAL HIGH (ref 0.50–1.10)
GFR calc Af Amer: 46 mL/min — ABNORMAL LOW (ref >90)
GFR calc non Af Amer: 40 mL/min — ABNORMAL LOW (ref >90)
Glucose, Bld: 98 mg/dL (ref 70–99)

## 2013-10-08 LAB — CBC WITH DIFFERENTIAL/PLATELET
Basophils Relative: 1 % (ref 0–1)
Eosinophils Absolute: 0.4 10*3/uL (ref 0.0–0.7)
HCT: 29 % — ABNORMAL LOW (ref 36.0–46.0)
Hemoglobin: 10.2 g/dL — ABNORMAL LOW (ref 12.0–15.0)
Lymphs Abs: 1.2 10*3/uL (ref 0.7–4.0)
MCH: 33.3 pg (ref 26.0–34.0)
MCHC: 35.2 g/dL (ref 30.0–36.0)
MCV: 94.8 fL (ref 78.0–100.0)
Monocytes Absolute: 0.7 10*3/uL (ref 0.1–1.0)
Monocytes Relative: 8 % (ref 3–12)
Platelets: 262 10*3/uL (ref 150–400)
RBC: 3.06 MIL/uL — ABNORMAL LOW (ref 3.87–5.11)

## 2013-10-08 MED ORDER — MORPHINE SULFATE 4 MG/ML IJ SOLN
4.0000 mg | Freq: Once | INTRAMUSCULAR | Status: AC
  Administered 2013-10-08: 4 mg via INTRAVENOUS
  Filled 2013-10-08: qty 1

## 2013-10-08 MED ORDER — DIPHENHYDRAMINE HCL 25 MG PO TABS: 25.0000 mg | ORAL_TABLET | Freq: Four times a day (QID) | ORAL | Status: DC | PRN

## 2013-10-08 MED ORDER — FLUCONAZOLE 150 MG PO TABS: 150.0000 mg | ORAL_TABLET | Freq: Once | ORAL | Status: DC

## 2013-10-08 MED ORDER — SUCRALFATE 1 GM/10ML PO SUSP: 1.0000 g | Freq: Three times a day (TID) | ORAL | Status: DC

## 2013-10-08 MED ORDER — TRAMADOL HCL 50 MG PO TABS: 50.0000 mg | ORAL_TABLET | Freq: Four times a day (QID) | ORAL | Status: DC | PRN

## 2013-10-08 MED ORDER — SODIUM CHLORIDE 0.9 % IV BOLUS (SEPSIS): 1000.0000 mL | Freq: Once | INTRAVENOUS | Status: DC

## 2013-10-08 MED ORDER — SUCRALFATE 1 GM/10ML PO SUSP
1.0000 g | Freq: Once | ORAL | Status: AC
  Administered 2013-10-08: 1 g via ORAL
  Filled 2013-10-08: qty 10

## 2013-10-08 MED ORDER — ALPRAZOLAM 0.5 MG PO TABS
0.5000 mg | ORAL_TABLET | Freq: Once | ORAL | Status: AC
  Administered 2013-10-08: 0.5 mg via ORAL
  Filled 2013-10-08: qty 1

## 2013-10-08 MED ORDER — ACETAMINOPHEN-CODEINE #3 300-30 MG PO TABS: 1.0000 | ORAL_TABLET | Freq: Four times a day (QID) | ORAL | Status: DC | PRN

## 2013-10-08 MED ORDER — ONDANSETRON HCL 4 MG/2ML IJ SOLN
4.0000 mg | Freq: Once | INTRAMUSCULAR | Status: AC
  Administered 2013-10-08: 4 mg via INTRAVENOUS
  Filled 2013-10-08: qty 2

## 2013-10-08 MED ORDER — ONDANSETRON 4 MG PO TBDP: 4.0000 mg | ORAL_TABLET | Freq: Three times a day (TID) | ORAL | Status: DC | PRN

## 2013-10-08 MED ORDER — SODIUM CHLORIDE 0.9 % IV BOLUS (SEPSIS)
1000.0000 mL | Freq: Once | INTRAVENOUS | Status: AC
  Administered 2013-10-08: 1000 mL via INTRAVENOUS

## 2013-10-08 NOTE — ED Provider Notes (Signed)
CSN: [id number]     Arrival date & time 10/08/13  1111 History   First MD Initiated Contact with Patient 10/08/13 1144     Chief Complaint  Patient presents with  . Rectal Pain   (Consider location/radiation/quality/duration/timing/severity/associated sxs/prior Treatment) HPI Comments: Patient is a 52 year old female past medical history significant for hypertension, anxiety, depression, breast cancer currently undergoing radiation therapy presenting to the emergency department for severe rectal pain. Patient states that she noticed a boil on the right buttock about one week ago when her pain increased and she noticed purulent drainage. Patient states she has also been having sharp intermittent rectal pain over the last six plus months with dramatic increase in her pain today. Patient states she is also been dealing with rectal tears due to constipation from pain management for her cancer. Patient states she had a very hard painful bowel movement this morning. She denies any fevers, vomiting, diarrhea, abdominal pain. Patient underwent radiation therapy this morning. Patient has not had a colonoscopy before.  The history is provided by the patient, a relative and a parent.    Past Medical History  Diagnosis Date  . Hypertension   . Anxiety   . Depression   . Headache(784.0)   . Sleep apnea     cpcp  . Shortness of breath   . Breast cancer 03/27/13    right   Past Surgical History  Procedure Laterality Date  . Mastectomy, radical Right 04/08/2013  . Simple mastectomy with axillary sentinel node biopsy Left 04/08/2013  . Mastectomy modified radical Right 04/07/2013    Procedure: RIGHT MASTECTOMY MODIFIED RADICAL;  Surgeon: Kandis Cocking, MD;  Location: [facility] OR;  Service: General;  Laterality: Right;  . Simple mastectomy with axillary sentinel node biopsy Left 04/07/2013    Procedure:  LEFT SMPLE MASTECTOMY WITH AXILLARY SENTINEL NODE BIOPSY;  Surgeon: Kandis Cocking, MD;  Location: [facility] OR;   Service: General;  Laterality: Left;  . Portacath placement Left 04/07/2013    Procedure: INSERTION PORT-A-CATH;  Surgeon: Kandis Cocking, MD;  Location: [facility] OR;  Service: General;  Laterality: Left;   Family History  Problem Relation Age of Onset  . Breast cancer Mother 55  . Breast cancer Maternal Grandmother 45  . Prostate cancer Paternal Grandfather 96   History  Substance Use Topics  . Smoking status: Never Smoker   . Smokeless tobacco: Never Used  . Alcohol Use: No   OB History   Grav Para Term Preterm Abortions TAB SAB Ect Mult Living                 Review of Systems  Constitutional: Negative for fever.  HENT: Negative.   Eyes: Negative.   Respiratory: Negative for shortness of breath.   Cardiovascular: Negative for chest pain.  Gastrointestinal: Positive for rectal pain. Negative for nausea, vomiting, abdominal pain, diarrhea, blood in stool and anal bleeding.  Genitourinary: Negative.   Musculoskeletal: Negative.   Allergic/Immunologic: Positive for immunocompromised state.  Neurological: Negative.   Psychiatric/Behavioral: The patient is nervous/anxious.     Allergies  Vicodin; Codeine; Penicillins; Percocet; and Septra  Home Medications   Current Outpatient Rx  Name  Route  Sig  Dispense  Refill  . ALPRAZolam (XANAX) 0.5 MG tablet   Oral   Take 0.5 mg by mouth 3 (three) times daily as needed for anxiety.         Marland Kitchen diltiazem 2 % GEL   Topical   Apply 1 application topically 2 (  two) times daily.   30 g   1   . escitalopram (LEXAPRO) 10 MG tablet   Oral   Take 10 mg by mouth daily.          . meloxicam (MOBIC) 15 MG tablet   Oral   Take 15 mg by mouth daily.         Marland Kitchen triamterene-hydrochlorothiazide (MAXZIDE-25) 37.5-25 MG per tablet   Oral   Take 1 tablet by mouth daily.         . ondansetron (ZOFRAN ODT) 4 MG disintegrating tablet   Oral   Take 1 tablet (4 mg total) by mouth every 8 (eight) hours as needed for nausea or vomiting.    10 tablet   0   . sucralfate (CARAFATE) 1 GM/10ML suspension   Oral   Take 10 mLs (1 g total) by mouth 4 (four) times daily -  with meals and at bedtime.   420 mL   0   . traMADol (ULTRAM) 50 MG tablet   Oral   Take 1 tablet (50 mg total) by mouth every 6 (six) hours as needed.   15 tablet   0    BP 129/72  Pulse 67  Temp(Src) 97.6 F (36.4 C) (Oral)  Resp 16  SpO2 100%  LMP 07/28/2012 Physical Exam  Constitutional: She is oriented to person, place, and time. She appears well-developed and well-nourished. She appears distressed.  HENT:  Head: Normocephalic and atraumatic.  Right Ear: External ear normal.  Left Ear: External ear normal.  Nose: Nose normal.  Eyes: Conjunctivae are normal.  Neck: Neck supple.  Cardiovascular: Normal rate, regular rhythm and normal heart sounds.   Pulmonary/Chest: Effort normal and breath sounds normal.  Abdominal: Soft. Bowel sounds are normal. There is no tenderness.  Genitourinary: Rectal exam shows mass and tenderness. Rectal exam shows no fissure and anal tone normal.  Musculoskeletal: Normal range of motion.  Neurological: She is alert and oriented to person, place, and time.  Skin: Skin is warm and dry. She is not diaphoretic.  Psychiatric: Her mood appears anxious.    ED Course  Procedures (including critical care time)  Medications  sodium chloride 0.9 % bolus 1,000 mL (not administered)  sucralfate (CARAFATE) 1 GM/10ML suspension 1 g (not administered)  ALPRAZolam (XANAX) tablet 0.5 mg (0.5 mg Oral Given 10/08/13 1252)  sodium chloride 0.9 % bolus 1,000 mL (0 mLs Intravenous Stopped 10/08/13 1910)  morphine 4 MG/ML injection 4 mg (4 mg Intravenous Given 10/08/13 1318)  ondansetron (ZOFRAN) injection 4 mg (4 mg Intravenous Given 10/08/13 1317)  morphine 4 MG/ML injection 4 mg (4 mg Intravenous Given 10/08/13 1455)    Labs Review Labs Reviewed  CBC WITH DIFFERENTIAL - Abnormal; Notable for the following:    RBC 3.06 (*)     Hemoglobin 10.2 (*)    HCT 29.0 (*)    All other components within normal limits  BASIC METABOLIC PANEL - Abnormal; Notable for the following:    Potassium 3.2 (*)    Creatinine, Ser 1.50 (*)    GFR calc non Af Amer 40 (*)    GFR calc Af Amer 46 (*)    All other components within normal limits  OCCULT BLOOD, POC DEVICE - Abnormal; Notable for the following:    Fecal Occult Bld POSITIVE (*)    All other components within normal limits  OCCULT BLOOD X 1 CARD TO LAB, STOOL   Imaging Review Ct Abdomen Pelvis Wo Contrast  10/08/2013  CLINICAL DATA:  Rectal pain and bleeding.  EXAM: CT ABDOMEN AND PELVIS WITHOUT CONTRAST  TECHNIQUE: Multidetector CT imaging of the abdomen and pelvis was performed following the standard protocol without intravenous contrast.  COMPARISON:  08/24/2012  FINDINGS: The lung bases are clear except for minimal dependent left basilar atelectasis. No worrisome pulmonary lesions. The heart is normal in size. No pericardial effusion. The distal esophagus is grossly normal.  The liver is unremarkable without contrast. No focal lesions or biliary dilatation. The gallbladder is mildly distended. No common bile duct dilatation. The pancreas is normal. The spleen is normal. Benign appearing left adrenal gland nodule consistent with an adenoma. The kidneys are unremarkable. No renal or obstructing ureteral calculi.  The stomach, duodenum, small bowel and colon are grossly normal without oral contrast. The appendix is normal. No mesenteric or retroperitoneal mass or adenopathy. The aorta is normal in caliber. Mild scattered atherosclerotic calcifications are noted.  The uterus is unremarkable. The left ovary is slightly enlarged compared to the right but no definite mass. No pelvic mass, adenopathy or free pelvic fluid collections. No inguinal mass or adenopathy. Bladder appears normal. No rectal wall thickening or rectal mass. No perirectal inflammatory changes. The ischiorectal fossa  is normal. The sigmoid colon demonstrates scattered diverticuli but no findings for acute diverticulitis.  IMPRESSION: Unremarkable CT abdomen/pelvis. No rectal mass or rectal wall thickening is identified.   Electronically Signed   By: Loralie Champagne M.D.   On: 10/08/2013 18:02    EKG Interpretation   None       MDM   1. Rectal pain     Afebrile, uncomfortable appearing, non-toxic appearing, AAOx4 presenting w/ rectal pain. Abdomen soft, non-tender, non-distended. Large painful mass appreciated on rectal exam. No frank melena. No BRB per rectum. Hemoccult positive. CT scan unremarkable. Patient more comfortable appearing after pain management. I have reviewed nursing notes, vital signs, and all appropriate lab and imaging results for this patient. Rectal pain likely related to history of anal tears from straining and constipation. Will prescribe pain medication, nausea medication, Carafate to help coat stomach to help alleviate symptoms. Advised PCP, radiation oncology, and GI follow up. Return precautions discussed. Patient is agreeable to plan. Patient is stable at time of discharge. Patient d/w with Dr. Gwendolyn Grant, agrees with plan.           Jeannetta Ellis, PA-C 10/08/13 1939

## 2013-10-08 NOTE — ED Notes (Signed)
Waiting for Carafate from [facility] to d/c pt.

## 2013-10-08 NOTE — ED Notes (Signed)
Pt here c/o rectal pain from tears that pt has had hx of; pt sts had radiation treatment today but having extreme pain

## 2013-10-08 NOTE — ED Notes (Signed)
Pt tearful and concerned about BP of 138/62. Explained to pt that this is an appropriate pressure and that her vitals are stable. Pt reassured. Pt reports not taking Xanax today. PA made aware. Family at bedside.

## 2013-10-09 ENCOUNTER — Ambulatory Visit
Admission: RE | Admit: 2013-10-09 | Discharge: 2013-10-09 | Disposition: A | Discharge status: Home / Self Care | Payer: Medicaid Other | Source: Ambulatory Visit | Attending: Radiation Oncology | Admitting: Radiation Oncology

## 2013-10-09 NOTE — ED Provider Notes (Signed)
Medical screening examination/treatment/procedure(s) were conducted as a shared visit with non-physician practitioner(s) and myself.  I personally evaluated the patient during the encounter.  EKG Interpretation   None       Patient here with rectal pain. Soft abdomen, benign. Mass on rectal exam per PA. CT normal. Patient has f/u tomorrow.  Dagmar Hait, MD 10/09/13 [phone]

## 2013-10-10 ENCOUNTER — Ambulatory Visit
Admission: RE | Admit: 2013-10-10 | Discharge: 2013-10-10 | Disposition: A | Discharge status: Home / Self Care | Payer: Medicaid Other | Source: Ambulatory Visit | Attending: Radiation Oncology | Admitting: Radiation Oncology

## 2013-10-13 ENCOUNTER — Telehealth: Payer: Self-pay | Admitting: Radiation Oncology

## 2013-10-13 ENCOUNTER — Ambulatory Visit: Admission: RE | Admit: 2013-10-13 | Payer: Medicaid Other | Source: Ambulatory Visit | Admitting: Radiation Oncology

## 2013-10-13 ENCOUNTER — Ambulatory Visit: Payer: Medicaid Other

## 2013-10-13 ENCOUNTER — Telehealth: Payer: Self-pay | Admitting: *Deleted

## 2013-10-13 NOTE — Telephone Encounter (Signed)
I spoke with the patient's daughter this evening, she told her that her mother overslept. She will return tomorrow for her treatment.

## 2013-10-13 NOTE — Telephone Encounter (Signed)
Pt was no show for radiation treatment this morning. Called phone listed as home phone; recording stating phone number no longer in service, so number deleted. Daughter's contact number same as pt's mobile number which had no answer, no voice mail.  1:26 pm Second attempt to reach pt; was able to leave vm. Requested call back and left name and number.

## 2013-10-14 ENCOUNTER — Ambulatory Visit
Admission: RE | Admit: 2013-10-14 | Discharge: 2013-10-14 | Disposition: A | Discharge status: Home / Self Care | Payer: Medicaid Other | Source: Ambulatory Visit | Attending: Radiation Oncology | Admitting: Radiation Oncology

## 2013-10-14 ENCOUNTER — Encounter: Payer: Self-pay | Admitting: Radiation Oncology

## 2013-10-14 VITALS — BP 145/73 | HR 73 | Temp 98.0°F | Resp 20 | Wt 193.3 lb

## 2013-10-14 DIAGNOSIS — C50411 Malignant neoplasm of upper-outer quadrant of right female breast: Secondary | ICD-10-CM

## 2013-10-14 DIAGNOSIS — C50911 Malignant neoplasm of unspecified site of right female breast: Secondary | ICD-10-CM

## 2013-10-14 NOTE — Progress Notes (Signed)
Pt came in for treatment today, states she "overslept yesterday and is having problems w/constipation again". Pt states she had to strain yesterday to have BM, and she has tears at her rectum. She states her rectal area is very sore. She is applying steroid cream per Dr Ezzard Standing. She is taking stool softeners 2 tabs daily for constipation. Pt's yeast in her right axilla is resolved. She has not been applying Miconazole recently since this area is healed. Pt has Radiaplex to begin applying to right chest wall/axilla treatment area. She is fatigued today.

## 2013-10-14 NOTE — Progress Notes (Signed)
Weekly Management Note:  Site: Right chest wall/regional lymph nodes Current Dose:  1440  cGy Projected Dose: 5040  cGy followed by right chest wall mastectomy scar boost  Narrative: The patient is seen today for routine under treatment assessment. CBCT/MVCT images/port films were reviewed. The chart was reviewed.   She missed treatment yesterday, having overslept. She does have difficulty with constipation and anorectal discomfort for which she is waiting to see Dr. Ovidio Kin. I'm not sure if she has an anal fissure or perianal abscess. She's had a similar problem in the past.  Physical Examination:  Filed Vitals:   10/14/13 1040  BP: 145/73  Pulse: 73  Temp: 98 F (36.7 C)  Resp: 20  .  Weight: 193 lb 4.8 oz (87.68 kg). There is mild hyperpigmentation of the skin along the right chest wall/axilla. No areas of desquamation.  Impression: Tolerating radiation therapy well. She hopes see Dr. Ezzard Standing in the near future. I told her to remain on stool softeners.  Plan: Continue radiation therapy as planned.

## 2013-10-15 ENCOUNTER — Ambulatory Visit
Admission: RE | Admit: 2013-10-15 | Discharge: 2013-10-15 | Disposition: A | Discharge status: Home / Self Care | Payer: Medicaid Other | Source: Ambulatory Visit | Attending: Radiation Oncology | Admitting: Radiation Oncology

## 2013-10-16 ENCOUNTER — Ambulatory Visit
Admission: RE | Admit: 2013-10-16 | Discharge: 2013-10-16 | Disposition: A | Discharge status: Home / Self Care | Payer: Medicaid Other | Source: Ambulatory Visit | Attending: Radiation Oncology | Admitting: Radiation Oncology

## 2013-10-16 ENCOUNTER — Encounter (INDEPENDENT_AMBULATORY_CARE_PROVIDER_SITE_OTHER): Payer: Self-pay

## 2013-10-17 ENCOUNTER — Ambulatory Visit
Admission: RE | Admit: 2013-10-17 | Discharge: 2013-10-17 | Disposition: A | Discharge status: Home / Self Care | Payer: Medicaid Other | Source: Ambulatory Visit | Attending: Radiation Oncology | Admitting: Radiation Oncology

## 2013-10-20 ENCOUNTER — Ambulatory Visit
Admission: RE | Admit: 2013-10-20 | Discharge: 2013-10-20 | Disposition: A | Discharge status: Home / Self Care | Payer: Medicaid Other | Source: Ambulatory Visit | Attending: Radiation Oncology | Admitting: Radiation Oncology

## 2013-10-20 ENCOUNTER — Other Ambulatory Visit: Payer: Medicaid Other | Admitting: Lab

## 2013-10-20 ENCOUNTER — Encounter: Payer: Self-pay | Admitting: Radiation Oncology

## 2013-10-20 ENCOUNTER — Telehealth: Payer: Self-pay | Admitting: *Deleted

## 2013-10-20 VITALS — BP 116/74 | HR 81 | Temp 98.5°F | Ht 62.0 in | Wt 190.8 lb

## 2013-10-20 DIAGNOSIS — C50411 Malignant neoplasm of upper-outer quadrant of right female breast: Secondary | ICD-10-CM

## 2013-10-20 NOTE — Progress Notes (Signed)
Ms. Quizon has received 12 fractions to the bilateral chest wall .she has dry hyperpigmentation in the Mid chest region, but skin intact.  Encouraged to use Radiaplex Lotion but to avoid skin folds and axilla since she had a presentation of Questionblel yeast in these areas.  Using Antifungal powder.  She reports that she is "tired all of the time", in addition to being "irritable".

## 2013-10-20 NOTE — Telephone Encounter (Signed)
Pt requests her 2 pm lab appointment today be cancelled. She states she was seen in ED on 10/08/13 and had labs drawn that day. Called Val RN for Dr Darnelle Catalan and informed her of pt's request. She stated she would cancel 2 pm lab appt today for pt. Pt is aware.

## 2013-10-20 NOTE — Progress Notes (Signed)
Weekly Management Note:  Site: Right chest wall/regional lymph nodes Current Dose:  2160  cGy Projected Dose: 5040  cGy followed by 1000 cGy boost to right mastectomy scar.  Narrative: The patient is seen today for routine under treatment assessment. CBCT/MVCT images/port films were reviewed. The chart was reviewed.   She states that her rectal discomfort is improved. She'll see Dr. Ezzard Standing in December. She continues with her eye consult for her fungal infections and these are improved.  Physical Examination:  Filed Vitals:   10/20/13 1032  BP: 116/74  Pulse: 81  Temp: 98.5 F (36.9 C)  .  Weight: 190 lb 12.8 oz (86.546 kg). No significant skin changes. 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  Impression: Tolerating radiation therapy well.  Plan: Continue radiation therapy as planned.

## 2013-10-21 ENCOUNTER — Ambulatory Visit
Admission: RE | Admit: 2013-10-21 | Discharge: 2013-10-21 | Disposition: A | Discharge status: Home / Self Care | Payer: Medicaid Other | Source: Ambulatory Visit | Attending: Radiation Oncology | Admitting: Radiation Oncology

## 2013-10-22 ENCOUNTER — Ambulatory Visit
Admission: RE | Admit: 2013-10-22 | Discharge: 2013-10-22 | Disposition: A | Discharge status: Home / Self Care | Payer: Medicaid Other | Source: Ambulatory Visit | Attending: Radiation Oncology | Admitting: Radiation Oncology

## 2013-10-27 ENCOUNTER — Ambulatory Visit
Admission: RE | Admit: 2013-10-27 | Discharge: 2013-10-27 | Disposition: A | Discharge status: Home / Self Care | Payer: Medicaid Other | Source: Ambulatory Visit | Attending: Radiation Oncology | Admitting: Radiation Oncology

## 2013-10-27 VITALS — BP 156/92 | HR 80 | Temp 98.3°F | Resp 20 | Wt 192.5 lb

## 2013-10-27 DIAGNOSIS — C50911 Malignant neoplasm of unspecified site of right female breast: Secondary | ICD-10-CM

## 2013-10-27 NOTE — Progress Notes (Signed)
Pt denies pain but states her right shoulder hurts 3-4 times a week at night when she is sleeping. She lies on her left side with her right arm supported on a pillow. Pt has moist area in right axilla again. She states she is not applying Miconazole powder daily. Advised she begin to apply powder daily again after washing and drying area thoroughly. Pt has not begun applying Radiaplex to right chest wall. She has some dryness of skin; advised she begin Radiaplex. Pt's BP high today, states she just took her BP medication.

## 2013-10-27 NOTE — Progress Notes (Signed)
Weekly Management Note:  Site: Right chest wall/regional lymph nodes Current Dose:  2700  cGy Projected Dose: 5040  cGy followed by right mastectomy scar boost  Narrative: The patient is seen today for routine under treatment assessment. CBCT/MVCT images/port films were reviewed. The chart was reviewed.   She is without new complaints today except for more right shoulder discomfort when she is sleeping on her right side. She stopped applying miconazole powder along both axillae for a cutaneous fungal infection. She is now using Radioplex gel.  Physical Examination:  Filed Vitals:   10/27/13 1107  BP: 156/92  Pulse: 80  Temp: 98.3 F (36.8 C)  Resp: 20  .  Weight: 192 lb 8 oz (87.317 kg). There is mild hyperpigmentation the skin along the right chest wall/axilla with patchy dry desquamation along the axilla. I believe that the dry desquamation is from a fungal infection rather than radiation dermatitis.  Impression: Tolerating radiation therapy well. She is to resume her miconazole powder along her axillae.  Plan: Continue radiation therapy as planned.

## 2013-10-28 ENCOUNTER — Ambulatory Visit (HOSPITAL_BASED_OUTPATIENT_CLINIC_OR_DEPARTMENT_OTHER): Payer: Medicaid Other | Admitting: Oncology

## 2013-10-28 ENCOUNTER — Ambulatory Visit
Admission: RE | Admit: 2013-10-28 | Discharge: 2013-10-28 | Disposition: A | Discharge status: Home / Self Care | Payer: Medicaid Other | Source: Ambulatory Visit | Attending: Radiation Oncology | Admitting: Radiation Oncology

## 2013-10-28 ENCOUNTER — Telehealth: Payer: Self-pay | Admitting: Oncology

## 2013-10-28 VITALS — BP 137/81 | HR 92 | Temp 98.6°F | Resp 18 | Ht 62.0 in | Wt 192.1 lb

## 2013-10-28 DIAGNOSIS — C50411 Malignant neoplasm of upper-outer quadrant of right female breast: Secondary | ICD-10-CM

## 2013-10-28 DIAGNOSIS — K602 Anal fissure, unspecified: Secondary | ICD-10-CM

## 2013-10-28 DIAGNOSIS — C50419 Malignant neoplasm of upper-outer quadrant of unspecified female breast: Secondary | ICD-10-CM

## 2013-10-28 DIAGNOSIS — D6481 Anemia due to antineoplastic chemotherapy: Secondary | ICD-10-CM

## 2013-10-28 DIAGNOSIS — R21 Rash and other nonspecific skin eruption: Secondary | ICD-10-CM

## 2013-10-28 DIAGNOSIS — Z170 Estrogen receptor positive status [ER+]: Secondary | ICD-10-CM

## 2013-10-28 MED ORDER — TAMOXIFEN CITRATE 20 MG PO TABS: 20.0000 mg | ORAL_TABLET | Freq: Every day | ORAL | Status: DC

## 2013-10-28 MED ORDER — KETOCONAZOLE 2 % EX CREA: 1.0000 "application " | TOPICAL_CREAM | Freq: Every day | CUTANEOUS | Status: DC

## 2013-10-28 NOTE — Progress Notes (Signed)
Patient ID: [name] [name], female   DOB: 1960-12-11, 52 y.o.   MRN: [id number] ID: [name] [name] OB: 06/23/1961  MR#: [id number]  YQM#:[id number]  PCP: Raliegh Ip, MD GYN:   SU: Ovidio Kin OTHER MD: Chipper Herb;  Romie Levee;  Dorene Grebe; Etter Sjogren  CHIEF COMPLAINT:  Bilateral Breast Cancers   HISTORY OF PRESENT ILLNESS: [name] tells me she has not been able to have mammography for several years because of cost issues. More recently, she had noted a mass in her right breast but thought this might be MRSA. The urinary tract infection took her to her physician's office, and she brought the breast mass to the attention of [name]. She set up the patient for bilateral diagnostic mammography and right breast ultrasound at the [facility] 03/27/2013, which showed a 4 cm irregular mass in the upper outer right breast, with a 9 mm cluster of calcifications in the posterior lower right breast and a few mildly enlarged level I right axillary lymph nodes. The breast mass was palpable and ultrasound showed it to measure 4.1 cm, with some of the level I right axillary lymph nodes noted to have lost their fatty hilum.  Biopsy of both the suspicious areas in the right breast as well as one of the suspicious lymph nodes was performed 03/27/2013 and showed ([id number]) the larger breast mass to consist of an invasive ductal carcinoma with lobular features, grade 2, estrogen and progesterone receptor both 100% positive, with an MIB-1 of 15%, and no HER-2 amplification. 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 The sampled right axillary lymph node was also positive.  Bilateral breast MRI 04/01/2013 showed the larger right breast mass to measure 5.4 cm, with a satellite nodule immediately inferior to it measuring 8 mm. 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 There were multiple  enlarged right axillary lymph nodes, the largest measuring 2.8 cm. In addition, in the left breast there were 2 foci on non-masslike enhancement consistent with DCIS measuring 1.3 cm and 2.2 cm. These weren't different quadrants.  The patient's subsequent history is as noted below  INTERVAL HISTORY: [name] returns today accompanied by her daughter [name] [name] for followup of her bilateral breast cancers. Since her last visit here she has started her radiation treatments, which so far are doing "good". She has mild fatigue and no significant skin changes.  REVIEW OF SYSTEMS: [name]'s hair is just beginning to grow back. She has a yeast infection in the right underarm which is uncomfortable. She has good range of motion there however. She continues to have significant rectal pain because of her fissure, and is taking stool softeners to make sure there are no hard bowel movements. Otherwise a detailed review of systems today was noncontributory  PAST MEDICAL HISTORY: Past Medical History  Diagnosis Date  . Hypertension   . Anxiety   . Depression   . Headache(784.0)   . Sleep apnea     cpcp  . Shortness of breath   . Breast cancer 03/27/13    right    PAST SURGICAL HISTORY: Past Surgical History  Procedure Laterality Date  . Mastectomy, radical Right 04/08/2013  . Simple mastectomy with axillary sentinel node biopsy Left 04/08/2013  . Mastectomy modified radical Right 04/07/2013    Procedure: RIGHT MASTECTOMY MODIFIED RADICAL;  Surgeon: Kandis Cocking, MD;  Location: [facility] OR;  Service: General;  Laterality: Right;  . Simple mastectomy with axillary sentinel node biopsy Left 04/07/2013    Procedure:  LEFT SMPLE MASTECTOMY WITH AXILLARY SENTINEL NODE BIOPSY;  Surgeon: Kandis Cocking, MD;  Location: [facility] OR;  Service: General;  Laterality: Left;  . Portacath placement Left 04/07/2013    Procedure: INSERTION PORT-A-CATH;  Surgeon: Kandis Cocking, MD;  Location[facility] OR;  Service: General;  Laterality: Left;     FAMILY HISTORY Family History  Problem Relation Age of Onset  . Breast cancer Mother 60  . Breast cancer Maternal Grandmother 45  . Prostate cancer Paternal Grandfather 67   the patient's father died at age 64 in an airplane crash (he was a Transport planner unsupervised). The patient's mother is alive at age 49. Her name is [name] [name] and she works for [employer]. [name] [name] has a history of breast cancer diagnosed at age 52. She had one sister who is alive and well. [name]'s mother, the patient's grandmother, was diagnosed with breast cancer at age 40. She had 2 sisters, neither with breast cancer. [name] herself had one brother, no sisters. There is no other history of breast or ovary cancer in the family.  GYNECOLOGIC HISTORY:  Menarche age 56, she is GX P39, first live birth age 40. The patient was taking birth control pills until September 2013. She stopped having periods at that time, but had one in late March 2014.  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 Her husband [name] (goes by "[name]") is disabled secondary to diabetes. They were separated for the past 2 years, but [name] recently moved back in with [name]. Manon lives with her mother [name] [name] and 8 poodles. The patient's daughter [name] "[name] [name]" Vetter is 31 and also lives with the patient.    ADVANCED DIRECTIVES: In place. The patient has named her daughter [name] [name] as her healthcare power of attorney, bypassing her husband.  HEALTH MAINTENANCE: History  Substance Use Topics  . Smoking status: Never Smoker   . Smokeless tobacco: Never Used  . Alcohol Use: No     Colonoscopy: Never  PAP: Possibly 2006  Bone density: Possibly 2004  Lipid panel:  Allergies  Allergen Reactions  . Vicodin [Hydrocodone-Acetaminophen] Anaphylaxis  . Codeine Nausea And Vomiting  . Penicillins Rash  . Percocet [Oxycodone-Acetaminophen] Itching  . Septra [Bactrim] Other (See Comments)     Heart races    Current Outpatient Prescriptions  Medication Sig Dispense Refill  . acetaminophen-codeine (TYLENOL #3) 300-30 MG per tablet Take 1-2 tablets by mouth every 6 (six) hours as needed for severe pain.  15 tablet  0  . ALPRAZolam (XANAX) 0.5 MG tablet Take 0.5 mg by mouth 3 (three) times daily as needed for anxiety.      Marland Kitchen diltiazem 2 % GEL Apply 1 application topically 2 (two) times daily.  30 g  1  . escitalopram (LEXAPRO) 10 MG tablet Take 10 mg by mouth daily.       . meloxicam (MOBIC) 15 MG tablet Take 15 mg by mouth daily.      . ondansetron (ZOFRAN ODT) 4 MG disintegrating tablet Take 1 tablet (4 mg total) by mouth every 8 (eight) hours as needed for nausea or vomiting.  10 tablet  0  . sucralfate (CARAFATE) 1 GM/10ML suspension Take 10 mLs (1 g total) by mouth 4 (four) times daily -  with meals and at bedtime.  420 mL  0  . triamterene-hydrochlorothiazide (MAXZIDE-25) 37.5-25  MG per tablet Take 1 tablet by mouth daily.       No current facility-administered medications for this visit.   Facility-Administered Medications Ordered in Other Visits  Medication Dose Route Frequency Provider Last Rate Last Dose  . sodium chloride 0.9 % injection 10 mL  10 mL Intracatheter PRN Amy Allegra Grana, PA-C        OBJECTIVE:   Middle-aged white female in no acute distress  Filed Vitals:   10/28/13 1039  BP: 137/81  Pulse: 92  Temp: 98.6 F (37 C)  Resp: 18     Body mass index is 35.13 kg/(m^2).    ECOG FS: 1 Filed Weights   10/28/13 1039  Weight: 192 lb 1.6 oz (87.136 kg)   Sclerae unicteric, pupils equal and and round Oropharynx clear and moist-- no thrush or other lesions No cervical or supraclavicular adenopathy Lungs no rales or rhonchi Heart regular rate and rhythm Abd soft, nontender, obese, positive bowel sounds MSK no focal spinal tenderness Neuro: nonfocal, well oriented, appropriate affect Breasts: Status post bilateral mastectomies; minimal erythema on the  right chest wall;  small area of what looks like candidal rash in the right axilla. There are no suspicious masses in the right axilla and no evidence of local recurrence on the chest   LAB RESULTS:  Lab Results  Component Value Date   WBC 8.8 10/08/2013   NEUTROABS 6.4 10/08/2013   HGB 10.2* 10/08/2013   HCT 29.0* 10/08/2013   MCV 94.8 10/08/2013   PLT 262 10/08/2013      Chemistry      Component Value Date/Time   NA 138 10/08/2013 1246   NA 139 08/20/2013 1333   K 3.2* 10/08/2013 1246   K 3.9 08/20/2013 1333   CL 100 10/08/2013 1246   CL 103 05/20/2013 1404   CO2 20 10/08/2013 1246   CO2 27 08/20/2013 1333   BUN 13 10/08/2013 1246   BUN 19.5 08/20/2013 1333   CREATININE 1.50* 10/08/2013 1246   CREATININE 0.8 08/20/2013 1333      Component Value Date/Time   CALCIUM 9.8 10/08/2013 1246   CALCIUM 9.7 08/20/2013 1333   ALKPHOS 58 08/20/2013 1333   ALKPHOS 63 04/17/2013 0741   AST 6 08/20/2013 1333   AST 23 04/17/2013 0741   ALT 7 08/20/2013 1333   ALT 26 04/17/2013 0741   BILITOT 0.77 08/20/2013 1333   BILITOT 0.2* 04/17/2013 0741       STUDIES: Ct Abdomen Pelvis Wo Contrast  10/08/2013   CLINICAL DATA:  Rectal pain and bleeding.  EXAM: CT ABDOMEN AND PELVIS WITHOUT CONTRAST  TECHNIQUE: Multidetector CT imaging of the abdomen and pelvis was performed following the standard protocol without intravenous contrast.  COMPARISON:  08/24/2012  FINDINGS: The lung bases are clear except for minimal dependent left basilar atelectasis. No worrisome pulmonary lesions. The heart is normal in size. No pericardial effusion. The distal esophagus is grossly normal.  The liver is unremarkable without contrast. No focal lesions or biliary dilatation. The gallbladder is mildly distended. No common bile duct dilatation. The pancreas is normal. The spleen is normal. Benign appearing left adrenal gland nodule consistent with an adenoma. The kidneys are unremarkable. No renal or obstructing ureteral calculi.   The stomach, duodenum, small bowel and colon are grossly normal without oral contrast. The appendix is normal. No mesenteric or retroperitoneal mass or adenopathy. The aorta is normal in caliber. Mild scattered atherosclerotic calcifications are noted.  The uterus is unremarkable. The left  ovary is slightly enlarged compared to the right but no definite mass. No pelvic mass, adenopathy or free pelvic fluid collections. No inguinal mass or adenopathy. Bladder appears normal. No rectal wall thickening or rectal mass. No perirectal inflammatory changes. The ischiorectal fossa is normal. The sigmoid colon demonstrates scattered diverticuli but no findings for acute diverticulitis.  IMPRESSION: Unremarkable CT abdomen/pelvis. No rectal mass or rectal wall thickening is identified.   Electronically Signed   By: Loralie Champagne M.D.   On: 10/08/2013 18:02      ASSESSMENT: 52 y.o. 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 There were also 2 suspicious areas in the left breast noted by MRI.  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 Patient's testing pending  (5)  completed adjuvant chemotherapy, consisting of 6 cycles of docetaxel/  doxorubicin/ cyclophosphamide given on a Q. three-week basis, first dose  05/21/2013, last dose 09/03/2013   (6)  postmastectomy radiation therapy to be completed 11/21/2013  (7)  Rectal fissure, stable  (8) chemotherapy-induced Anemia   PLAN:  Kaydee is doing well with her radiation and she will soon be able to start anti-estrogens. We discussed the difference between aromatase inhibitors and tamoxifen, with special reference to side effects, toxicities and complications. After much discussion she would prefer to start tamoxifen, once she recovers from her radiation. That will be sometime in January. She is aware of concerns regarding risks of abnormal clotting and endometrial cancer. She was alerted to signs and symptoms that might make her a call us regarding these problems.  I wrote her a prescription for Nizoral for her right underarm rash. She will see [place] again in March and before that visit she will have a repeat CT of the chest. She will have been on tamoxifen 2 months by the time of that visit. If she is tolerating that well we will start seeing her on an every 6 month basis. Lowella Dell, MD   10/28/2013

## 2013-10-29 ENCOUNTER — Ambulatory Visit
Admission: RE | Admit: 2013-10-29 | Discharge: 2013-10-29 | Disposition: A | Discharge status: Home / Self Care | Payer: Medicaid Other | Source: Ambulatory Visit | Attending: Radiation Oncology | Admitting: Radiation Oncology

## 2013-10-30 ENCOUNTER — Ambulatory Visit
Admission: RE | Admit: 2013-10-30 | Discharge: 2013-10-30 | Disposition: A | Discharge status: Home / Self Care | Payer: Medicaid Other | Source: Ambulatory Visit | Attending: Radiation Oncology | Admitting: Radiation Oncology

## 2013-10-31 ENCOUNTER — Ambulatory Visit
Admission: RE | Admit: 2013-10-31 | Discharge: 2013-10-31 | Disposition: A | Discharge status: Home / Self Care | Payer: Medicaid Other | Source: Ambulatory Visit | Attending: Radiation Oncology | Admitting: Radiation Oncology

## 2013-11-03 ENCOUNTER — Ambulatory Visit
Admission: RE | Admit: 2013-11-03 | Discharge: 2013-11-03 | Disposition: A | Discharge status: Home / Self Care | Payer: Medicaid Other | Source: Ambulatory Visit | Attending: Radiation Oncology | Admitting: Radiation Oncology

## 2013-11-03 ENCOUNTER — Encounter: Payer: Self-pay | Admitting: Radiation Oncology

## 2013-11-03 ENCOUNTER — Ambulatory Visit: Admission: RE | Admit: 2013-11-03 | Payer: Medicaid Other | Source: Ambulatory Visit | Admitting: Radiation Oncology

## 2013-11-03 VITALS — BP 147/95 | HR 79 | Temp 98.5°F | Resp 20 | Wt 189.7 lb

## 2013-11-03 DIAGNOSIS — C50911 Malignant neoplasm of unspecified site of right female breast: Secondary | ICD-10-CM

## 2013-11-03 NOTE — Progress Notes (Signed)
Electron beam simulation note: The patient was taken to the [facility]. She was set up RAO. One custom block is constructed to conform the field along her right mastectomy scar. I prescribing 1000 cGy in 5 sessions utilizing 6 MEV electrons. 0.8 cm custom bolus will be constructed and applied to her skin on the first day of treatment. A special port plan is requested.

## 2013-11-03 NOTE — Progress Notes (Signed)
Weekly Management Note:  Site: Right chest wall/regional lymph nodes Current Dose:  3600  cGy Projected Dose: 5040  cGy followed by mastectomy scar boost  Narrative: The patient is seen today for routine under treatment assessment. CBCT/MVCT images/port films were reviewed. The chart was reviewed.   She is without new complaints today. She was given Nizoral cream by Dr. Darnelle Catalan for her fungal infection. She will stop miconazole powder.  Physical Examination:  Filed Vitals:   11/03/13 1026  BP: 147/95  Pulse: 79  Temp: 98.5 F (36.9 C)  Resp: 20  .  Weight: 189 lb 11.2 oz (86.047 kg). There is mild hyperpigmentation the skin along the right chest wall and nodes with dry desquamation along the right axilla secondary to her fungal infection.  Impression: Tolerating radiation therapy well.  Plan: Continue radiation therapy as planned.

## 2013-11-03 NOTE — Progress Notes (Signed)
Pt still has mild right shoulder pain and fatigue. She has been applying Miconazole powder to right axilla but continues to have moist desquamaiton. Dr Darnelle Catalan gave pt Nizoral cream which she has not begun. She states she will switch to the cream in her right axilla today.

## 2013-11-04 ENCOUNTER — Ambulatory Visit
Admission: RE | Admit: 2013-11-04 | Discharge: 2013-11-04 | Disposition: A | Discharge status: Home / Self Care | Payer: Medicaid Other | Source: Ambulatory Visit | Attending: Radiation Oncology | Admitting: Radiation Oncology

## 2013-11-05 ENCOUNTER — Ambulatory Visit
Admission: RE | Admit: 2013-11-05 | Discharge: 2013-11-05 | Disposition: A | Discharge status: Home / Self Care | Payer: Medicaid Other | Source: Ambulatory Visit | Attending: Radiation Oncology | Admitting: Radiation Oncology

## 2013-11-06 ENCOUNTER — Ambulatory Visit
Admission: RE | Admit: 2013-11-06 | Discharge: 2013-11-06 | Disposition: A | Discharge status: Home / Self Care | Payer: Medicaid Other | Source: Ambulatory Visit | Attending: Radiation Oncology | Admitting: Radiation Oncology

## 2013-11-07 ENCOUNTER — Ambulatory Visit
Admission: RE | Admit: 2013-11-07 | Discharge: 2013-11-07 | Disposition: A | Discharge status: Home / Self Care | Payer: Medicaid Other | Source: Ambulatory Visit | Attending: Radiation Oncology | Admitting: Radiation Oncology

## 2013-11-10 ENCOUNTER — Ambulatory Visit
Admission: RE | Admit: 2013-11-10 | Discharge: 2013-11-10 | Disposition: A | Discharge status: Home / Self Care | Payer: Medicaid Other | Source: Ambulatory Visit | Attending: Radiation Oncology | Admitting: Radiation Oncology

## 2013-11-10 ENCOUNTER — Encounter: Payer: Self-pay | Admitting: Radiation Oncology

## 2013-11-10 VITALS — BP 136/88 | HR 69 | Temp 98.6°F | Resp 20 | Wt 191.4 lb

## 2013-11-10 DIAGNOSIS — C50911 Malignant neoplasm of unspecified site of right female breast: Secondary | ICD-10-CM

## 2013-11-10 NOTE — Progress Notes (Signed)
Weekly Management Note:  Site: Right chest wall/regional lymph nodes Current Dose:  4500  cGy Projected Dose: 5040  cGy followed by right mastectomy scar boost  Narrative: The patient is seen today for routine under treatment assessment. CBCT/MVCT images/port films were reviewed. The chart was reviewed.   She tells that she changed back to miconazole powder which seemed be working better for her right axillary fungal infection compared to the Nizoral cream.  Physical Examination:  Filed Vitals:   11/10/13 1025  BP: 136/88  Pulse: 69  Temp: 98.6 F (37 C)  Resp: 20  .  Weight: 191 lb 6.4 oz (86.818 kg). There is hyperpigmentation the skin along the right chest wall with patchy dry desquamation. There is a follicular skin eruption along her right axilla which is felt to be secondary to a fungal infection.  Impression: Tolerating radiation therapy well, however, she does have worsening skin changes along her right axilla which is thought to be secondary to a fungal infection. She still has 3 more treatments to her right axilla. If things worsen then we have to consider whether not she has a bacterial folliculitis in addition to her known fungal infection. She may need to see a dermatologist.  Plan: Continue radiation therapy as planned.

## 2013-11-10 NOTE — Progress Notes (Signed)
Pt continues to have moist red area in right axilla. She stopped applying Nizoral cream yesterday, states "it was getting worse and burning." She is applying Miconazole powder, states "that seems to be the best thing". Pt applying Biafine to remainder of treatment area. She is fatigued.

## 2013-11-11 ENCOUNTER — Ambulatory Visit
Admission: RE | Admit: 2013-11-11 | Discharge: 2013-11-11 | Disposition: A | Discharge status: Home / Self Care | Payer: Medicaid Other | Source: Ambulatory Visit | Attending: Radiation Oncology | Admitting: Radiation Oncology

## 2013-11-12 ENCOUNTER — Ambulatory Visit
Admission: RE | Admit: 2013-11-12 | Discharge: 2013-11-12 | Disposition: A | Discharge status: Home / Self Care | Payer: Medicaid Other | Source: Ambulatory Visit | Attending: Radiation Oncology | Admitting: Radiation Oncology

## 2013-11-13 ENCOUNTER — Ambulatory Visit
Admission: RE | Admit: 2013-11-13 | Discharge: 2013-11-13 | Disposition: A | Discharge status: Home / Self Care | Payer: Medicaid Other | Source: Ambulatory Visit | Attending: Radiation Oncology | Admitting: Radiation Oncology

## 2013-11-13 ENCOUNTER — Ambulatory Visit: Payer: Medicaid Other

## 2013-11-14 ENCOUNTER — Ambulatory Visit: Payer: Medicaid Other

## 2013-11-14 ENCOUNTER — Ambulatory Visit
Admission: RE | Admit: 2013-11-14 | Discharge: 2013-11-14 | Disposition: A | Discharge status: Home / Self Care | Payer: Medicaid Other | Source: Ambulatory Visit | Attending: Radiation Oncology | Admitting: Radiation Oncology

## 2013-11-17 ENCOUNTER — Ambulatory Visit
Admission: RE | Admit: 2013-11-17 | Discharge: 2013-11-17 | Disposition: A | Discharge status: Home / Self Care | Payer: Medicaid Other | Source: Ambulatory Visit | Attending: Radiation Oncology | Admitting: Radiation Oncology

## 2013-11-17 ENCOUNTER — Other Ambulatory Visit: Payer: Self-pay | Admitting: Radiation Oncology

## 2013-11-17 ENCOUNTER — Telehealth: Payer: Self-pay

## 2013-11-17 VITALS — BP 150/95 | HR 80 | Temp 99.3°F | Ht 62.0 in | Wt 192.4 lb

## 2013-11-17 DIAGNOSIS — C50411 Malignant neoplasm of upper-outer quadrant of right female breast: Secondary | ICD-10-CM

## 2013-11-17 DIAGNOSIS — C50911 Malignant neoplasm of unspecified site of right female breast: Secondary | ICD-10-CM

## 2013-11-17 MED ORDER — RADIAPLEXRX EX GEL
Freq: Once | CUTANEOUS | Status: AC
  Administered 2013-11-17: 11:00:00 via TOPICAL

## 2013-11-17 MED ORDER — DOXYCYCLINE HYCLATE 100 MG PO TABS: 100.0000 mg | ORAL_TABLET | Freq: Two times a day (BID) | ORAL | Status: DC

## 2013-11-17 MED ORDER — CIPROFLOXACIN HCL 250 MG PO TABS: 250.0000 mg | ORAL_TABLET | Freq: Two times a day (BID) | ORAL | Status: DC

## 2013-11-17 NOTE — Progress Notes (Signed)
The pharmacy informed me that there is a reaction between Cipro and Lexapro, and I will place her on doxycycline 100 mg by mouth every 12 hours for one week.

## 2013-11-17 NOTE — Progress Notes (Addendum)
[name] [name] has had 30 fractions to her right chest wall.  She is having pain in her right chest and right underarm that she says is feels like shingles.  She also was nauseated when she first was here.  The skin on her right chest is red with peeling areas.  The skin on her right subclavian area is red and peeling.  The skin on her right underarm has yellow blisters.  She said she has been applying antifungal powder and using radiapex gel.  She needs a refill on both.  She has fatigue.  Another tube of radiaplex given.  Antifungal powder refill given per Dr. Dayton Scrape.

## 2013-11-17 NOTE — Progress Notes (Signed)
Weekly Management Note:  Site: Right chest wall boost Current Dose:  5440  cGy Projected Dose: 6040  cGy  Narrative: The patient is seen today for routine under treatment assessment. CBCT/MVCT images/port films were reviewed. The chart was reviewed.   She did have difficulty with discomfort along her right axilla. We suspect that she has a fungal infection, but she is now clearly worse. This area has been within her radiation therapy tangent fields. She is having a fair amount of drainage. There no lesions along her back to suggest shingles. She has been using miconazole powder. She uses Radioplex gel along the remained of her right chest wall.  Physical Examination:  Filed Vitals:   11/17/13 1027  BP: 150/95  Pulse: 80  Temp: 99.3 F (37.4 C)  .  Weight: 192 lb 6.4 oz (87.272 kg). There is now appears to be a raised confluent desquamation along her lower axilla. Again there is no evidence to suggest shingles with no lesions seen along her back. I wonder if she has a low grade cellulitis in addition to a fungal infection.  Impression: Tolerating radiation therapy well. Right axillary fungal infection and possible low-grade cellulitis in the field of radiation therapy.. She is allergic to penicillin. I would like to get her started on Cipro 250 mg by mouth twice a day. I'll see her tomorrow. It  would be near impossible to give her seen by a dermatologist this week.  Plan: Continue radiation therapy as planned. She will finish her radiation therapy this week.

## 2013-11-17 NOTE — Telephone Encounter (Signed)
Received call from  [facility] pharmacy that there is drug interaction between cipro and lexapro.Hold cipro. Dr.Murray will prescribe something else

## 2013-11-18 ENCOUNTER — Ambulatory Visit
Admission: RE | Admit: 2013-11-18 | Discharge: 2013-11-18 | Disposition: A | Discharge status: Home / Self Care | Payer: Medicaid Other | Source: Ambulatory Visit | Attending: Radiation Oncology | Admitting: Radiation Oncology

## 2013-11-18 DIAGNOSIS — C50911 Malignant neoplasm of unspecified site of right female breast: Secondary | ICD-10-CM

## 2013-11-18 NOTE — Progress Notes (Signed)
Weekly Management Note:  Site: Right chest wall boost Current Dose:  5640  cGy Projected Dose: 6040  cGy  Narrative: The patient is seen today for routine under treatment assessment. CBCT/MVCT images/port films were reviewed. The chart was reviewed.   She seen today for her wound check and weekly management. She continues with her miconazole powder. We started her on doxycycline yesterday. She believes that her axilla looks improved.  Physical Examination: There were no vitals filed for this visit..  Weight:  . On inspection of her right axilla there is less drainage, and there does appear to be reepithelialization.  Impression: Tolerating radiation therapy well. She'll finish her radiation therapy this Friday.  Plan: Continue radiation therapy as planned. Followup visit with me the week of January 5.

## 2013-11-18 NOTE — Progress Notes (Signed)
Pt in nursing after radiation treatment today per Dr Dayton Scrape. She states her right axilla "Is much better today". She began Doxycycline yesterday. She continues to apply Miconazole powder to right axilla. Axilla dry today. No blisters noted.

## 2013-11-19 ENCOUNTER — Ambulatory Visit
Admission: RE | Admit: 2013-11-19 | Discharge: 2013-11-19 | Disposition: A | Discharge status: Home / Self Care | Payer: Medicaid Other | Source: Ambulatory Visit | Attending: Radiation Oncology | Admitting: Radiation Oncology

## 2013-11-19 ENCOUNTER — Ambulatory Visit: Payer: Medicaid Other

## 2013-11-21 ENCOUNTER — Encounter: Payer: Self-pay | Admitting: Radiation Oncology

## 2013-11-21 ENCOUNTER — Ambulatory Visit
Admission: RE | Admit: 2013-11-21 | Discharge: 2013-11-21 | Disposition: A | Discharge status: Home / Self Care | Payer: Medicaid Other | Source: Ambulatory Visit | Attending: Radiation Oncology | Admitting: Radiation Oncology

## 2013-11-21 NOTE — Progress Notes (Signed)
Pt seen after she completed her last treatment today. Gave her FU card for 12/01/12 per Dr Dayton Scrape. Pt states her right axilla "is much better". She continues to apply Miconazole powder.

## 2013-11-21 NOTE — Progress Notes (Signed)
[facility] Radiation Oncology End of Treatment Note  Name:[name] [name]  Date: 11/21/2013 VHQ:[id number] DOB:1961-03-18   Status:outpatient    CC: Raliegh Ip, MD  Dr. Ovidio Kin  REFERRING PHYSICIAN:   Dr. Ovidio Kin   DIAGNOSIS:  Pathologic stage IIIA (T2 N2a M0) invasive lobular carcinoma of the right breast  INDICATION FOR TREATMENT: Curative   TREATMENT DATES: 10/02/2013 through 11/21/2013                          SITE/DOSE:   Right chest wall and right supraclavicular/axillary region 5040 cGy 28 sessions, right chest wall mastectomy scar boost 1000 cGy 5 sessions                         BEAMS/ENERGY: Tangential fields to the right chest wall with mixed 6 MV/10 MV photons. 1.0 cm bolus applied to the skin every other day to maximize the dose to the skin surface. 10 MV photons to the right supraclavicular/axillary region, dose prescribed at 3 cm depth. 6 MEV electrons, right mastectomy scar boost, en face with 0.8 cm bolus to maximize the dose to the skin surface.                 NARRATIVE:  The patient tolerated treatment well although she developed what I believe to be a mixed fungal and bacterial infection along her right axilla with moist desquamation during the last 2 weeks of her treatment. This was treated with miconazole powder and doxycycline with improvement of her skin desquamation.                          PLAN: Followup visit with me the week of January 5. Patient instructed to call if questions or worsening complaints in interim.

## 2013-11-26 ENCOUNTER — Encounter: Payer: Self-pay | Admitting: *Deleted

## 2013-12-01 ENCOUNTER — Ambulatory Visit
Admission: RE | Admit: 2013-12-01 | Discharge: 2013-12-01 | Disposition: A | Discharge status: Home / Self Care | Payer: Medicaid Other | Source: Ambulatory Visit | Attending: Radiation Oncology | Admitting: Radiation Oncology

## 2013-12-01 VITALS — BP 152/98 | HR 69 | Temp 98.5°F | Wt 189.5 lb

## 2013-12-01 DIAGNOSIS — C50911 Malignant neoplasm of unspecified site of right female breast: Secondary | ICD-10-CM

## 2013-12-01 NOTE — Progress Notes (Signed)
Patient here for follow up completion of radiation to right chest wall on 11/21/13.Skin dry and peeling.States she hasn't been taking a shower or using radiaplexCompleted antibiotic therapy of doxycycline and still using miconazole powder.Informed ok to shower now and continue application of radiaplex or be directed by Dr.Murray's suggestion.Tamoxifen on hold until further directed.

## 2013-12-01 NOTE — Progress Notes (Signed)
Followup note:  The patient returns today approximately 10 days following completion of radiation therapy to her right chest wall and regional lymph nodes. On completion of treatment she had a moist desquamation along her right axilla with what I believe to be a mixed fungal and bacterial infection along her right axilla. She was placed on doxycycline and a miconazole powder. She is much improved.  Physical examination: Alert and oriented. Filed Vitals:   12/01/13 1024  BP: 152/98  Pulse: 69  Temp: 98.5 F (36.9 C)   On inspection the right chest wall she is a complete reepithelialization along her right axilla with no residual infection. She does have dry desquamation along the right mastectomy scar region with focal areas of moist desquamation.  Impression: Satisfactory.  Plan: She is to see miconazole powder along her right axilla, and Radioplex gel along her right chest wall. She was encouraged to use unscented Dove and to keep her chest wall/axilla dry. Follow visit with me in one month.

## 2013-12-24 IMAGING — CT CT ANGIO CHEST
2 of 6 series · 19 of 36 positions shown · IV contrast (APPLIED)
Comparison: 04/14/2013.

CLINICAL DATA: Chest pain. Recent mastectomy.

CT ANGIOGRAPHY CHEST
TECHNIQUE: Multidetector CT imaging of the chest using the
standard protocol during bolus administration of intravenous
contrast. Multiplanar reconstructed images including MIPs were
obtained and reviewed to evaluate the vascular anatomy.
Contrast: 100mL OMNIPAQUE IOHEXOL 350 MG/ML SOLN

[Series 6: pulm embolism 1.0 b25f thins · axial · 0.63mm/px · z∈[-252,-42]mm · 18 of 234 slices shown]
[im 12/234  lung]
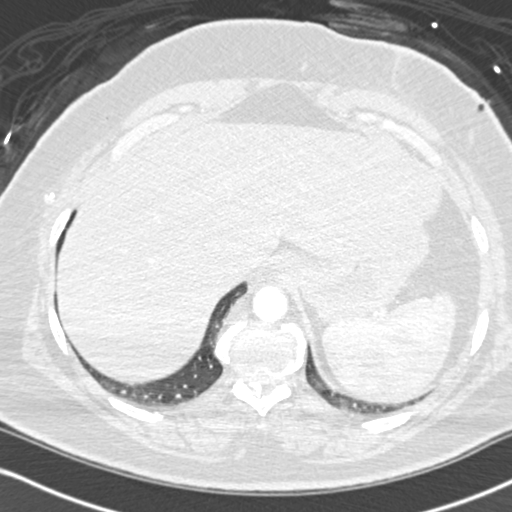
[im 24/234  mediastinal]
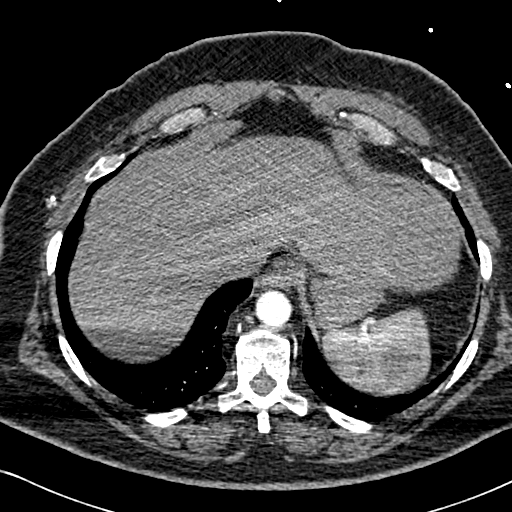
[im 35/234  lung]
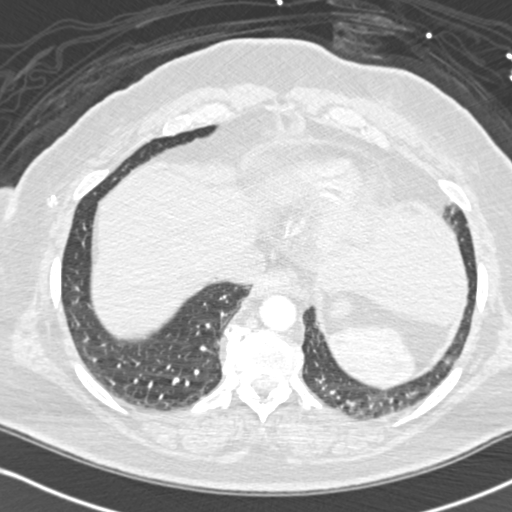
[im 47/234  mediastinal]
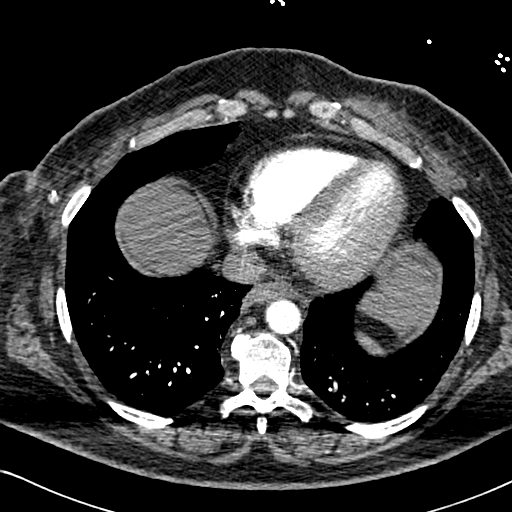
[im 59/234  lung]
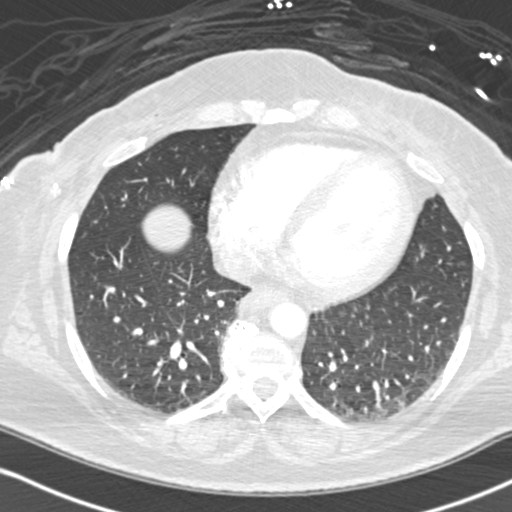
[im 70/234  mediastinal]
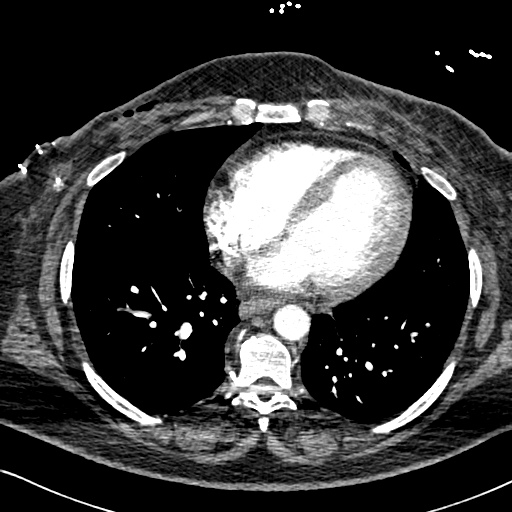
[im 82/234  lung]
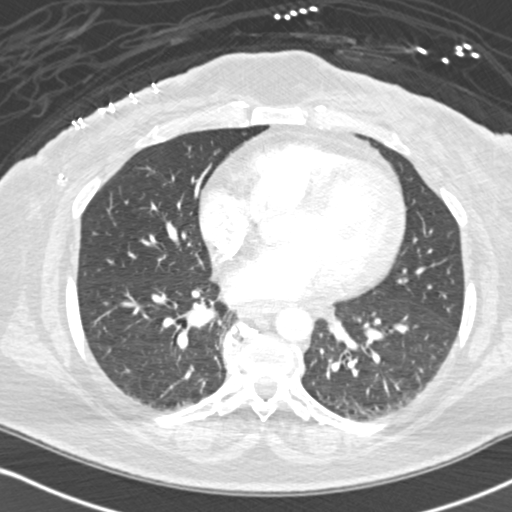
[im 94/234  mediastinal]
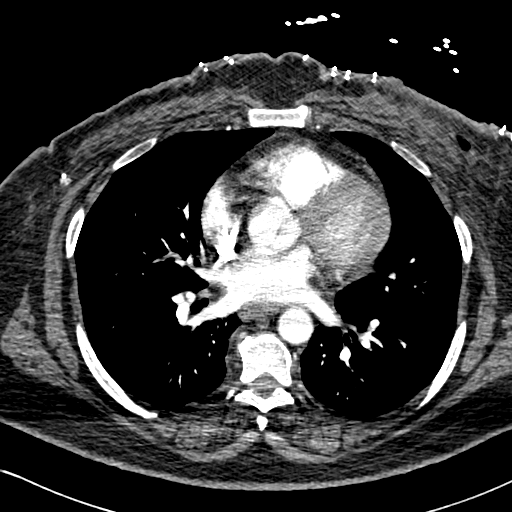
[im 105/234  lung]
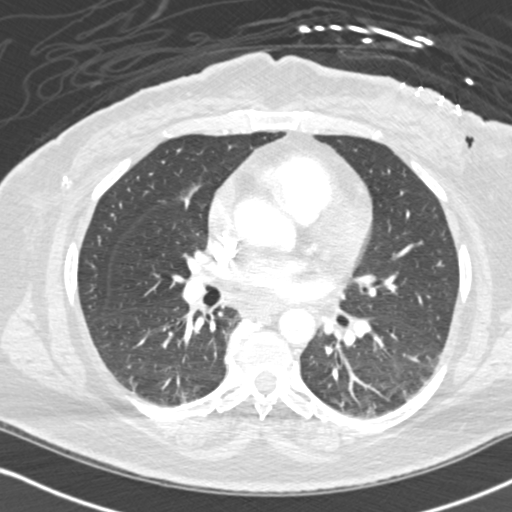
[im 129/234  mediastinal]
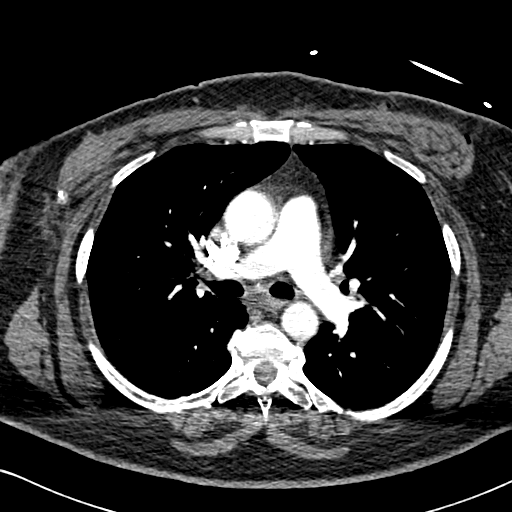
[im 140/234  lung]
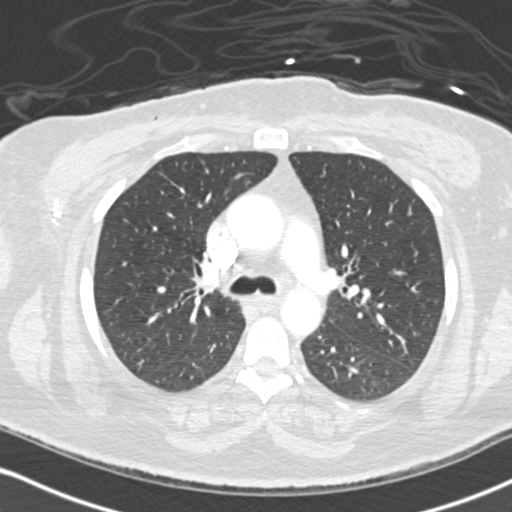
[im 152/234  mediastinal]
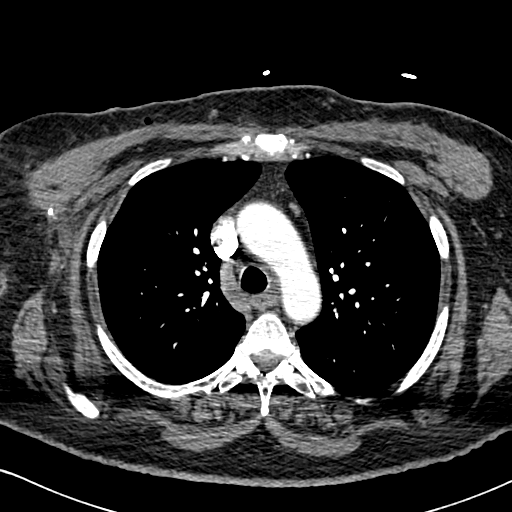
[im 164/234  lung]
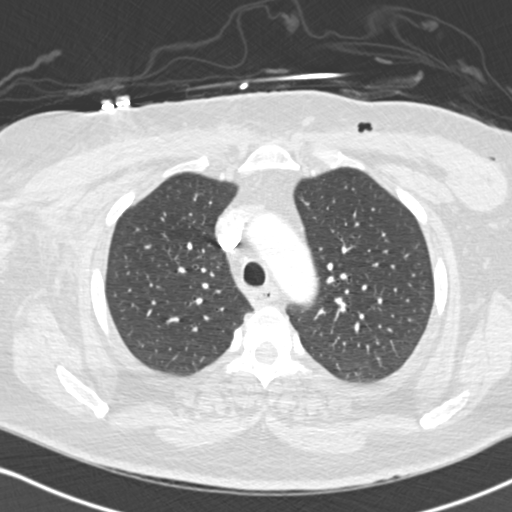
[im 175/234  mediastinal]
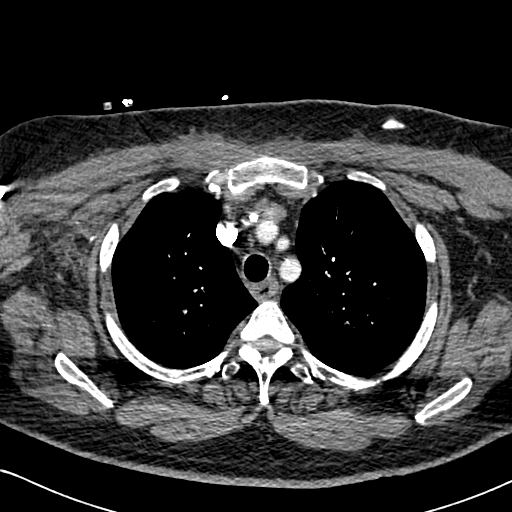
[im 187/234  lung]
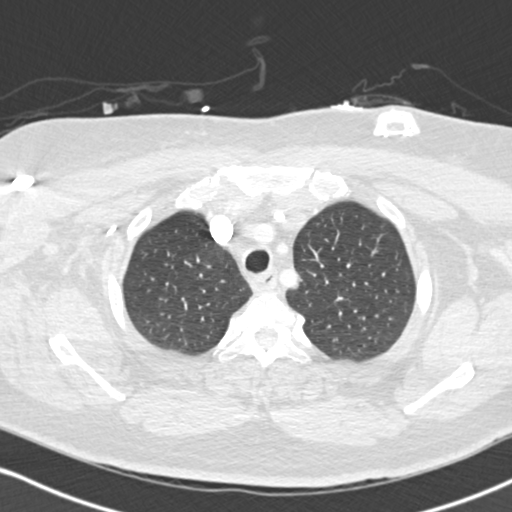
[im 199/234  mediastinal]
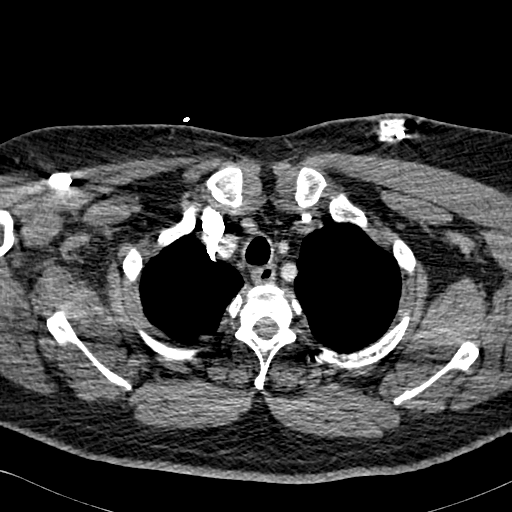
[im 210/234  lung]
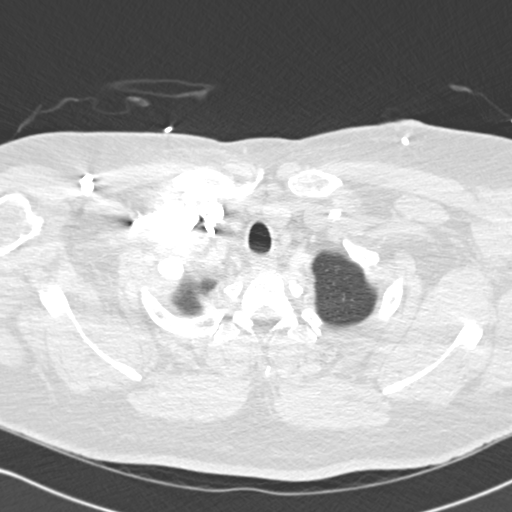
[im 222/234  mediastinal]
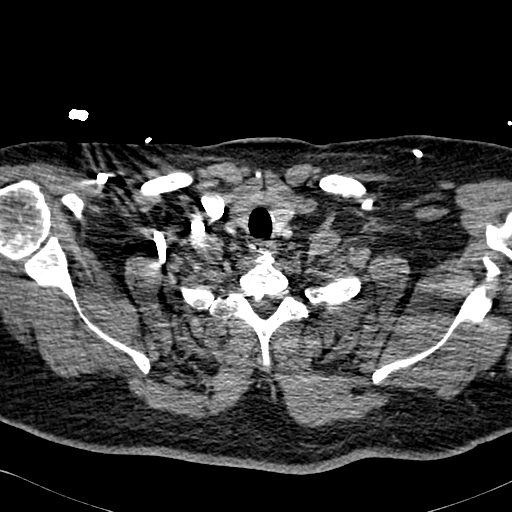

[Series 602: coronal · coronal · 0.63mm/px · 1 of 123 slices shown]
[im 62/123  mediastinal]
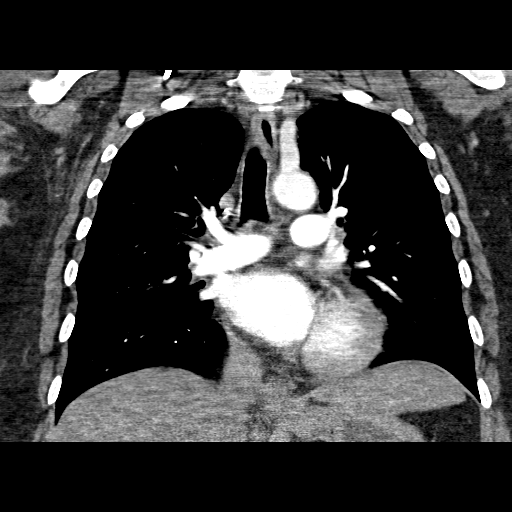

[19 of 36 positions shown; findings below may reference images not displayed]

FINDINGS: The chest wall demonstrates a recent surgical changes
from bilateral mastectomies.  Expected postoperative findings.  A
left-sided Port-A-Cath is noted.  The bony thorax is intact.  No
destructive bone lesions or spinal canal compromise.  Degenerative
changes are noted throughout the thoracic spine.

The heart is normal in size.  No pericardial effusion.  No
mediastinal or hilar lymphadenopathy.  The esophagus is grossly
normal.  The aorta is normal in caliber.  No dissection.

The pulmonary arterial tree is well opacified.  No filling defects
to suggest pulmonary emboli.

Examination of the lung parenchyma demonstrates no acute pulmonary
findings other than dependent bibasilar atelectasis.  No
infiltrates, edema or effusions.  No worrisome pulmonary nodules or
masses.  No bronchiectasis.

The upper abdomen is unremarkable.
IMPRESSION: 1.  Expected surgical changes from bilateral mastectomies.
2.  No CT findings for pulmonary embolism.
3.  Normal thoracic aorta.
4.  No acute pulmonary findings.

## 2014-01-06 ENCOUNTER — Encounter: Payer: Self-pay | Admitting: Radiation Oncology

## 2014-01-06 ENCOUNTER — Ambulatory Visit
Admission: RE | Admit: 2014-01-06 | Discharge: 2014-01-06 | Disposition: A | Discharge status: Home / Self Care | Payer: Medicaid Other | Source: Ambulatory Visit | Attending: Radiation Oncology | Admitting: Radiation Oncology

## 2014-01-06 VITALS — BP 161/91 | HR 80 | Temp 98.1°F | Resp 20 | Wt 193.0 lb

## 2014-01-06 DIAGNOSIS — C50919 Malignant neoplasm of unspecified site of unspecified female breast: Secondary | ICD-10-CM

## 2014-01-06 NOTE — Progress Notes (Signed)
Followup note:  Diagnosis: Pathologic stage IIIA (T2 N2a M0) invasive lobular carcinoma of the right breast  [name] [name] returns today approximately 7 weeks following completion of radiation therapy to her right chest wall and regional lymph nodes in the management of her T2 N2a M0 invasive lobular carcinoma the right breast. She is without complaints today except for mild fatigue for the past 3 days and a possible sinus/left ear infection. She has not yet seen her primary care physician, Dr. Laurian Brim. She has not yet started her adjuvant tamoxifen prescribed by Dr. Gunnar Bulla Magrinat. She tells me that she remains anxious about her future with respect to her cancer diagnosis. I explained her that we had a program "feeling your new normal" that she would probably benefit from. We will give her literature.  Physical examination: Alert and oriented. Wt Readings from Last 3 Encounters:  01/06/14 193 lb (87.544 kg)  12/01/13 189 lb 8 oz (85.957 kg)  11/17/13 192 lb 6.4 oz (87.272 kg)   Temp Readings from Last 3 Encounters:  01/06/14 98.1 F (36.7 C) Oral  12/01/13 98.5 F (36.9 C)   11/17/13 99.3 F (37.4 C)    BP Readings from Last 3 Encounters:  01/06/14 161/91  12/01/13 152/98  11/17/13 150/95   Pulse Readings from Last 3 Encounters:  01/06/14 80  12/01/13 69  11/17/13 80   Nodes: There is no palpable cervical, supraclavicular, or axillary lymphadenopathy. Chest: There is residual hyperpigmentation with dry desquamation of the skin along the right chest wall. There is no evidence for residual fungal or bacterial infection along her right axilla. No visible or palpable evidence for recurrent disease. Extremities: Without edema.  Impression: No evidence for recurrent disease.  Plan: Followup through Dr. Jana Hakim and his team. She is to start her adjuvant tamoxifen in the near future. She is to see Dr. Hardin Negus for possible ear/sinus infection. I've not scheduled the patient for a  formal followup visit, but I would more than happy to see her in the future should the need arise.

## 2014-01-06 NOTE — Progress Notes (Signed)
Pt denies pain. She reports fatigue x 3 days and "feeling like I am getting sick with sinus".  Pt states she has drainage "down the back of her throat". She denies cough, sore throat, fever, earache. Pt states she has noticed drainage from her left ear x 2 weeks. Left ear is red, edematous, no drainage noted today. Pt's chest wall treatment area has some dry desquamation.

## 2014-02-03 ENCOUNTER — Ambulatory Visit (HOSPITAL_BASED_OUTPATIENT_CLINIC_OR_DEPARTMENT_OTHER): Payer: Medicaid Other | Admitting: Physician Assistant

## 2014-02-03 ENCOUNTER — Ambulatory Visit (HOSPITAL_COMMUNITY)
Admission: RE | Admit: 2014-02-03 | Discharge: 2014-02-03 | Disposition: A | Discharge status: Home / Self Care | Payer: Medicaid Other | Source: Ambulatory Visit | Attending: Oncology | Admitting: Oncology

## 2014-02-03 ENCOUNTER — Telehealth: Payer: Self-pay | Admitting: *Deleted

## 2014-02-03 ENCOUNTER — Encounter: Payer: Self-pay | Admitting: Physician Assistant

## 2014-02-03 ENCOUNTER — Other Ambulatory Visit (HOSPITAL_BASED_OUTPATIENT_CLINIC_OR_DEPARTMENT_OTHER): Payer: Medicaid Other

## 2014-02-03 ENCOUNTER — Encounter (HOSPITAL_COMMUNITY): Payer: Self-pay

## 2014-02-03 VITALS — BP 140/76 | HR 69 | Temp 98.4°F | Resp 18 | Ht 62.0 in | Wt 205.7 lb

## 2014-02-03 DIAGNOSIS — C50411 Malignant neoplasm of upper-outer quadrant of right female breast: Secondary | ICD-10-CM

## 2014-02-03 DIAGNOSIS — D6481 Anemia due to antineoplastic chemotherapy: Secondary | ICD-10-CM

## 2014-02-03 DIAGNOSIS — H9212 Otorrhea, left ear: Secondary | ICD-10-CM | POA: Insufficient documentation

## 2014-02-03 DIAGNOSIS — C50419 Malignant neoplasm of upper-outer quadrant of unspecified female breast: Secondary | ICD-10-CM

## 2014-02-03 DIAGNOSIS — Z853 Personal history of malignant neoplasm of breast: Secondary | ICD-10-CM

## 2014-02-03 DIAGNOSIS — D649 Anemia, unspecified: Secondary | ICD-10-CM

## 2014-02-03 DIAGNOSIS — R7989 Other specified abnormal findings of blood chemistry: Secondary | ICD-10-CM | POA: Insufficient documentation

## 2014-02-03 DIAGNOSIS — R7309 Other abnormal glucose: Secondary | ICD-10-CM

## 2014-02-03 DIAGNOSIS — C50919 Malignant neoplasm of unspecified site of unspecified female breast: Secondary | ICD-10-CM | POA: Insufficient documentation

## 2014-02-03 DIAGNOSIS — Z170 Estrogen receptor positive status [ER+]: Secondary | ICD-10-CM

## 2014-02-03 DIAGNOSIS — R799 Abnormal finding of blood chemistry, unspecified: Secondary | ICD-10-CM

## 2014-02-03 DIAGNOSIS — T451X5A Adverse effect of antineoplastic and immunosuppressive drugs, initial encounter: Secondary | ICD-10-CM

## 2014-02-03 LAB — CBC WITH DIFFERENTIAL/PLATELET
BASO%: 0.6 % (ref 0.0–2.0)
Basophils Absolute: 0.1 10*3/uL (ref 0.0–0.1)
EOS%: 6.2 % (ref 0.0–7.0)
Eosinophils Absolute: 0.6 10*3/uL — ABNORMAL HIGH (ref 0.0–0.5)
HCT: 32.6 % — ABNORMAL LOW (ref 34.8–46.6)
HGB: 10.8 g/dL — ABNORMAL LOW (ref 11.6–15.9)
LYMPH%: 17 % (ref 14.0–49.7)
MCH: 30.7 pg (ref 25.1–34.0)
MCHC: 33.1 g/dL (ref 31.5–36.0)
MCV: 92.7 fL (ref 79.5–101.0)
MONO#: 0.5 10*3/uL (ref 0.1–0.9)
MONO%: 5.5 % (ref 0.0–14.0)
NEUT#: 6.6 10*3/uL — ABNORMAL HIGH (ref 1.5–6.5)
NEUT%: 70.7 % (ref 38.4–76.8)
Platelets: 244 10*3/uL (ref 145–400)
RBC: 3.51 10*6/uL — AB (ref 3.70–5.45)
RDW: 14.1 % (ref 11.2–14.5)
WBC: 9.3 10*3/uL (ref 3.9–10.3)
lymph#: 1.6 10*3/uL (ref 0.9–3.3)

## 2014-02-03 LAB — COMPREHENSIVE METABOLIC PANEL (CC13)
ANION GAP: 8 meq/L (ref 3–11)
AST: 8 U/L (ref 5–34)
Albumin: 3.4 g/dL — ABNORMAL LOW (ref 3.5–5.0)
Alkaline Phosphatase: 58 U/L (ref 40–150)
BUN: 16.6 mg/dL (ref 7.0–26.0)
CALCIUM: 9.2 mg/dL (ref 8.4–10.4)
CO2: 28 meq/L (ref 22–29)
Chloride: 105 mEq/L (ref 98–109)
Creatinine: 1.4 mg/dL — ABNORMAL HIGH (ref 0.6–1.1)
Glucose: 145 mg/dl — ABNORMAL HIGH (ref 70–140)
Potassium: 3.8 mEq/L (ref 3.5–5.1)
SODIUM: 141 meq/L (ref 136–145)
TOTAL PROTEIN: 7.2 g/dL (ref 6.4–8.3)
Total Bilirubin: <0.2 mg/dL (ref 0.20–1.20)

## 2014-02-03 MED ORDER — IOHEXOL 300 MG/ML  SOLN
80.0000 mL | Freq: Once | INTRAMUSCULAR | Status: AC | PRN
  Administered 2014-02-03: 80 mL via INTRAVENOUS

## 2014-02-03 NOTE — Telephone Encounter (Signed)
Called to inform pt of results CT of chest. Pt was very pleased. Verbalized understanding. Message to be forwarded to [employer], PA-C.

## 2014-02-03 NOTE — Progress Notes (Signed)
Patient ID: ROBERTTA [name], [name] [name]   DOB: March 19, 1961, 53 y.o.   MRN: [id number] ID: [name] OB: 1961/08/18  MR#: [id number]  AOZ#:[id number]  PCP: Raliegh Ip, MD GYN:   SU: Ovidio Kin, MD OTHER MD: Chipper Herb, MD;  Romie Levee, MD;  Dorene Grebe, MD; Etter Sjogren, MD  CHIEF COMPLAINT:  Hx of Bilateral Breast Cancers   HISTORY OF PRESENT ILLNESS: [name] [name] had not been able to have mammography for several years because of cost issues. More recently, she had noted a mass in her right breast but thought this might be MRSA. The urinary tract infection took her to her physician's office, and she brought the breast mass to the attention of [name]. She set up the patient for bilateral diagnostic mammography and right breast ultrasound at the [facility] Center 03/27/2013, which showed a 4 cm irregular mass in the upper outer right breast, with a 9 mm cluster of calcifications in the posterior lower right breast and a few mildly enlarged level I right axillary lymph nodes. The breast mass was palpable and ultrasound showed it to measure 4.1 cm, with some of the level I right axillary lymph nodes noted to have lost their fatty hilum.  Biopsy of both the suspicious areas in the right breast as well as one of the suspicious lymph nodes was performed 03/27/2013 and showed ([id number]) the larger breast mass to consist of an invasive ductal carcinoma with lobular features, grade 2, estrogen and progesterone receptor both 100% positive, with an MIB-1 of 15%, and no HER-2 amplification. 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 The sampled right axillary lymph node was also positive.  Bilateral breast MRI 04/01/2013 showed the larger right breast mass to measure 5.4 cm, with a satellite nodule immediately inferior to it measuring 8 mm. 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 There were  multiple enlarged right axillary lymph nodes, the largest measuring 2.8 cm. In addition, in the left breast there were 2 foci on non-masslike enhancement consistent with DCIS measuring 1.3 cm and 2.2 cm. These weren't different quadrants.  The patient's subsequent history is as noted below  INTERVAL HISTORY: [name] [name] returns today accompanied by her daughter [name] [name] for followup of her bilateral breast cancers. Since her last visit here she completed radiation, final treatment on 11/21/2013.  She tolerated radiation well with the exception of mild fatigue and minor skin changes, both of which are improving.  She met with Dr. Darnelle Catalan in early December, at which time they discussed antiestrogen therapy with tamoxifen. She was given a prescription, and started her first dose on February 11. She has now been on tamoxifen for 4 weeks and is tolerating it well. She has noted some increased hot flashes. She denies any vaginal changes, no dryness, discharge, or abnormal vaginal bleeding. She's had no peripheral swelling. She has no history of blood clots. She does have a recent change in vision and admits that she needs her eyes examined. I encouraged her to do so.    [name] is concerned today about some changes in her left ear. She has recently had some drainage from the left ear for approximately 2 weeks. The drainage is clear is clear, and at often happens at night, then "dries on her ear". She's noted no bleeding. She feels like her hearing is decreased and her ear canal is swollen. She denies any actual  ear pain, and has had no fevers or chills. She has had some sinus congestion which is stable.      REVIEW OF SYSTEMS: [name] [name] continues to have mild fatigue, and occasionally has some insomnia. She continues to have shortness of breath with exertion which is stable. She's had no increased cough or phlegm production. She denies any chest pain or palpitations. Her appetite has improved, and she continues to have  some mild reflux but no nausea, emesis, or change in bowel or bladder habits. Her rectal fissure has improved. She's had no recent rectal bleeding. [name] [name] denies any abnormal headaches or dizziness. She does feel somewhat forgetful at times. She also has long-standing anxiety and depression which she tells me is "okay", and she denies any suicidal ideation.  A detailed review of systems is otherwise stable and noncontributory.   PAST MEDICAL HISTORY: Past Medical History  Diagnosis Date  . Hypertension   . Anxiety   . Depression   . Headache(784.0)   . Sleep apnea     cpcp  . Shortness of breath   . Breast cancer 03/27/13    right  . Hx of radiation therapy 10/02/13- 11/21/13    right chest wall, right supraclav/axillary region 5040 cGy 28 sessions, right chest wall mastectomy scar boost 1000 cGy 5 sessions    PAST SURGICAL HISTORY: Past Surgical History  Procedure Laterality Date  . Mastectomy, radical Right 04/08/2013  . Simple mastectomy with axillary sentinel node biopsy Left 04/08/2013  . Mastectomy modified radical Right 04/07/2013    Procedure: RIGHT MASTECTOMY MODIFIED RADICAL;  Surgeon: Kandis Cocking, MD;  Location: [facility] OR;  Service: General;  Laterality: Right;  . Simple mastectomy with axillary sentinel node biopsy Left 04/07/2013    Procedure:  LEFT SMPLE MASTECTOMY WITH AXILLARY SENTINEL NODE BIOPSY;  Surgeon: Kandis Cocking, MD;  Location: [facility] OR;  Service: General;  Laterality: Left;  . Portacath placement Left 04/07/2013    Procedure: INSERTION PORT-A-CATH;  Surgeon: Kandis Cocking, MD;  Location: [facility] OR;  Service: General;  Laterality: Left;    FAMILY HISTORY Family History  Problem Relation Age of Onset  . Breast cancer Mother 73  . Breast cancer Maternal Grandmother 45  . Prostate cancer Paternal Grandfather 57   the patient's father died at age 80 in an airplane crash (he was a Transport planner unsupervised). The patient's mother is alive at age 13. Her name is  [name] [name] and she works for [employer]. [name] has a history of breast cancer diagnosed at age 64. She had one sister who is alive and well. [name]'s mother, the patient's grandmother, was diagnosed with breast cancer at age 45. She had 2 sisters, neither with breast cancer. Terissa herself had one brother, no sisters. There is no other history of breast or ovary cancer in the family.  GYNECOLOGIC HISTORY:   (updated 02/03/2014)  Menarche age 75, she is GX P76, first live birth age 71. The patient was taking birth control pills until September 2013. She stopped having periods at that time, but had one in late March 2014.  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 Her husband Zymeria Maresh (goes by "[name]") is disabled secondary to diabetes. They were separated for the past 2 years, but [name] recently moved back in with [employer]. Syleena lives with her mother [name] and 8 poodles. The patient's daughter [name] "[name] [name]" [place] is 67 and also lives with the patient.  ADVANCED DIRECTIVES: In place. The patient has named her daughter [name] [name] as her healthcare power of attorney, bypassing her husband.  HEALTH MAINTENANCE:  (Updated 02/03/2014) History  Substance Use Topics  . Smoking status: Never Smoker   . Smokeless tobacco: Never Used  . Alcohol Use: No     Colonoscopy: Never  PAP: Possibly 2006, Not on file  Bone density: Possibly 2004, Not on file  Lipid panel: Not on file  Allergies  Allergen Reactions  . Vicodin [Hydrocodone-Acetaminophen] Anaphylaxis  . Codeine Nausea And Vomiting  . Penicillins Rash  . Percocet [Oxycodone-Acetaminophen] Itching  . Septra [Bactrim] Other (See Comments)    Heart races    Current Outpatient Prescriptions  Medication Sig Dispense Refill  . ALPRAZolam (XANAX) 0.5 MG tablet Take 0.5 mg by mouth 3 (three) times daily as needed for anxiety.      Marland Kitchen diltiazem 2 % GEL Apply 1 application topically 2 (two)  times daily.  30 g  1  . escitalopram (LEXAPRO) 10 MG tablet Take 10 mg by mouth daily.       . meloxicam (MOBIC) 15 MG tablet Take 15 mg by mouth daily.      . tamoxifen (NOLVADEX) 20 MG tablet Take 1 tablet (20 mg total) by mouth daily.  90 tablet  4  . triamterene-hydrochlorothiazide (MAXZIDE-25) 37.5-25 MG per tablet Take 1 tablet by mouth daily.      . miconazole (MICOTIN) 2 % powder Apply topically as needed for itching.       No current facility-administered medications for this visit.   Facility-Administered Medications Ordered in Other Visits  Medication Dose Route Frequency Provider Last Rate Last Dose  . sodium chloride 0.9 % injection 10 mL  10 mL Intracatheter PRN Stacy Deshler Allegra Grana, PA-C        OBJECTIVE:   Middle-aged white [name] [name]  who appears tired but is in no acute distress  Filed Vitals:   02/03/14 1054  BP: 140/76  Pulse: 69  Temp: 98.4 F (36.9 C)  Resp: 18     Body mass index is 37.61 kg/(m^2).    ECOG FS: 1 Filed Weights   02/03/14 1054  Weight: 205 lb 11.2 oz (93.305 kg)   Physical Exam: HEENT:  Sclerae anicteric.  Oropharynx clear and moist. Left ear canal is swollen, and I was unable to visualize the TM. There is evidence of clear drainage which has tried on the external portion of the ear. There is no evidence of bleeding. The ear itself is not tender to manipulation or palpation. Right ear is unremarkable. Neck supple, trachea midline.  NODES:  No cervical or supraclavicular lymphadenopathy palpated.  BREAST EXAM:  Patient is status post bilateral mastectomies. She's also status post radiation to the right chest wall with some minor hyperpigmentation but no additional skin changes noted. No moist desquamation. No suspicious nodularities or evidence of local recurrence. Axillae are benign bilaterally, with no palpable lymphadenopathy.  LUNGS:  Clear to auscultation bilaterally.  No wheezes or rhonchi HEART:  Regular rate and rhythm. No murmur  ABDOMEN:  Soft,   obese, nontender.  Positive bowel sounds.  MSK:  No focal spinal tenderness to palpation. Good range of motion bilaterally in the upper extremities.  EXTREMITIES:  No peripheral edema.  No lymphedema in the upper extremities.  SKIN:  Benign No visible rashes or skin changes noted. No excessive ecchymoses or petechiae. No pallor.  Port is intact in the left upper chest wall, no erythema or edema, and no  evidence of infection/cellulitis.  NEURO:  Nonfocal. Well oriented.  Appropriate  affect.    LAB RESULTS:  Lab Results  Component Value Date   WBC 9.3 02/03/2014   NEUTROABS 6.6* 02/03/2014   HGB 10.8* 02/03/2014   HCT 32.6* 02/03/2014   MCV 92.7 02/03/2014   PLT 244 02/03/2014      Chemistry      Component Value Date/Time   NA 141 02/03/2014 0923   NA 138 10/08/2013 1246   K 3.8 02/03/2014 0923   K 3.2* 10/08/2013 1246   CL 100 10/08/2013 1246   CL 103 05/20/2013 1404   CO2 28 02/03/2014 0923   CO2 20 10/08/2013 1246   BUN 16.6 02/03/2014 0923   BUN 13 10/08/2013 1246   CREATININE 1.4* 02/03/2014 0923   CREATININE 1.50* 10/08/2013 1246      Component Value Date/Time   CALCIUM 9.2 02/03/2014 0923   CALCIUM 9.8 10/08/2013 1246   ALKPHOS 58 02/03/2014 0923   ALKPHOS 63 04/17/2013 0741   AST 8 02/03/2014 0923   AST 23 04/17/2013 0741   ALT <6 02/03/2014 0923   ALT 26 04/17/2013 0741   BILITOT <0.20 02/03/2014 0923   BILITOT 0.2* 04/17/2013 0741       STUDIES:  A chest CT was obtained today, 02/03/2014, with results pending. This was to followup on small nonspecific nodules along the minor fissure of the right lung, measuring up to 3 mm, and noted on chest CT in May 2014.      ASSESSMENT: 53 y.o. 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 There were also 2 suspicious areas in the left breast noted by MRI.  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 Patient's testing pending  (5)  completed adjuvant chemotherapy, consisting of 6 cycles of docetaxel/ doxorubicin/ cyclophosphamide given on a Q. three-week basis, first dose  05/21/2013, last dose 09/03/2013   (6)  postmastectomy radiation therapy, completed 11/21/2013  (7)  Rectal fissure, improved  (8)  chemotherapy-induced Anemia, improving  (9)  elevated glucose   (10)  elevated serum creatinine  (11)  ? Otitis externa of left ear with drainage, but no pain   PLAN:  Cletta is doing well  with regards to her breast cancer, and clinically there is no evidence of disease recurrence. We are awaiting her chest CT results from earlier today to followup on the nonspecific nodules previously noted on CT in May. She will continue on tamoxifen which she seems to be tolerating well.  Otherwise, I have encouraged Raylie to see her primary care practitioner as soon as possible this week for further evaluation of the left ear, her elevated glucose, and her elevated serum creatinine. According to her Medicaid card, her PCP is [facility] in [place], and we will place a referral to their office to get her established as soon as possible.   Kinleigh still has her port, but would like to wait until she has used her EMLA cream to have it flushed. We will  schedule her for a port flush next week, then every 6 weeks until she sees [place] again in late August. She can actually have the port removed at any time from our standpoint. She is hoping to have her reconstructive surgery, however, sometime later this summer. If that is the case, she can have the port removed at that time.     All of the above was reviewed with the patient today, and both [facility] and [facility] voice their understanding and agreement with our plan. She does not recall any changes or problems that might her prior to her next appointment. She'll also let us know if she has any problems getting an appointment with her primary care doctor this week.   Tonique Mendonca, PA-C   02/03/2014

## 2014-02-03 NOTE — Telephone Encounter (Signed)
appts made and printed. Pt is aware to call and set up an appt w/ PCP due to she already an established pt....td

## 2014-02-09 ENCOUNTER — Telehealth: Payer: Self-pay | Admitting: *Deleted

## 2014-02-09 NOTE — Telephone Encounter (Signed)
F/U with pt to see if she has seen a doctor about ear drainage. Pt has an appt set for 02/17/14 with Dr. Perrin Maltese. I also discussed the elevated blood glucose and creatinine. Pt told me that before coming to appt here last week she went to [place] for breakfast and this may have been why her blood sugar was slightly elevated.Message to be forwarded to [employer], PA-C.

## 2014-02-10 ENCOUNTER — Ambulatory Visit (HOSPITAL_BASED_OUTPATIENT_CLINIC_OR_DEPARTMENT_OTHER): Payer: Medicaid Other

## 2014-02-10 VITALS — BP 143/66 | HR 68 | Temp 98.4°F

## 2014-02-10 DIAGNOSIS — C50419 Malignant neoplasm of upper-outer quadrant of unspecified female breast: Secondary | ICD-10-CM

## 2014-02-10 DIAGNOSIS — Z452 Encounter for adjustment and management of vascular access device: Secondary | ICD-10-CM

## 2014-02-10 DIAGNOSIS — Z95828 Presence of other vascular implants and grafts: Secondary | ICD-10-CM

## 2014-02-10 MED ORDER — HEPARIN SOD (PORK) LOCK FLUSH 100 UNIT/ML IV SOLN
500.0000 [IU] | Freq: Once | INTRAVENOUS | Status: AC
  Administered 2014-02-10: 500 [IU] via INTRAVENOUS
  Filled 2014-02-10: qty 5

## 2014-02-10 MED ORDER — SODIUM CHLORIDE 0.9 % IJ SOLN
10.0000 mL | INTRAMUSCULAR | Status: DC | PRN
  Administered 2014-02-10: 10 mL via INTRAVENOUS
  Filled 2014-02-10: qty 10

## 2014-02-10 NOTE — Patient Instructions (Signed)

## 2014-03-24 ENCOUNTER — Ambulatory Visit (HOSPITAL_BASED_OUTPATIENT_CLINIC_OR_DEPARTMENT_OTHER): Payer: Medicaid Other

## 2014-03-24 VITALS — BP 152/81 | HR 70

## 2014-03-24 DIAGNOSIS — C50419 Malignant neoplasm of upper-outer quadrant of unspecified female breast: Secondary | ICD-10-CM

## 2014-03-24 DIAGNOSIS — Z95828 Presence of other vascular implants and grafts: Secondary | ICD-10-CM

## 2014-03-24 DIAGNOSIS — Z452 Encounter for adjustment and management of vascular access device: Secondary | ICD-10-CM

## 2014-03-24 MED ORDER — HEPARIN SOD (PORK) LOCK FLUSH 100 UNIT/ML IV SOLN
500.0000 [IU] | Freq: Once | INTRAVENOUS | Status: AC
  Administered 2014-03-24: 500 [IU] via INTRAVENOUS
  Filled 2014-03-24: qty 5

## 2014-03-24 MED ORDER — SODIUM CHLORIDE 0.9 % IJ SOLN
10.0000 mL | INTRAMUSCULAR | Status: DC | PRN
  Administered 2014-03-24: 10 mL via INTRAVENOUS
  Filled 2014-03-24: qty 10

## 2014-05-05 ENCOUNTER — Ambulatory Visit (HOSPITAL_BASED_OUTPATIENT_CLINIC_OR_DEPARTMENT_OTHER): Payer: Self-pay

## 2014-05-05 VITALS — BP 139/79 | HR 68 | Temp 97.8°F

## 2014-05-05 DIAGNOSIS — C50419 Malignant neoplasm of upper-outer quadrant of unspecified female breast: Secondary | ICD-10-CM

## 2014-05-05 DIAGNOSIS — Z452 Encounter for adjustment and management of vascular access device: Secondary | ICD-10-CM

## 2014-05-05 DIAGNOSIS — Z95828 Presence of other vascular implants and grafts: Secondary | ICD-10-CM

## 2014-05-05 MED ORDER — HEPARIN SOD (PORK) LOCK FLUSH 100 UNIT/ML IV SOLN
500.0000 [IU] | Freq: Once | INTRAVENOUS | Status: AC
  Administered 2014-05-05: 500 [IU] via INTRAVENOUS
  Filled 2014-05-05: qty 5

## 2014-05-05 MED ORDER — SODIUM CHLORIDE 0.9 % IJ SOLN
10.0000 mL | INTRAMUSCULAR | Status: DC | PRN
  Administered 2014-05-05: 10 mL via INTRAVENOUS
  Filled 2014-05-05: qty 10

## 2014-05-05 NOTE — Patient Instructions (Signed)

## 2014-06-16 ENCOUNTER — Ambulatory Visit (HOSPITAL_BASED_OUTPATIENT_CLINIC_OR_DEPARTMENT_OTHER): Payer: Self-pay

## 2014-06-16 VITALS — BP 152/70 | HR 68 | Temp 98.0°F

## 2014-06-16 DIAGNOSIS — Z95828 Presence of other vascular implants and grafts: Secondary | ICD-10-CM

## 2014-06-16 DIAGNOSIS — Z452 Encounter for adjustment and management of vascular access device: Secondary | ICD-10-CM

## 2014-06-16 DIAGNOSIS — C50419 Malignant neoplasm of upper-outer quadrant of unspecified female breast: Secondary | ICD-10-CM

## 2014-06-16 MED ORDER — HEPARIN SOD (PORK) LOCK FLUSH 100 UNIT/ML IV SOLN
500.0000 [IU] | Freq: Once | INTRAVENOUS | Status: AC
  Administered 2014-06-16: 500 [IU] via INTRAVENOUS
  Filled 2014-06-16: qty 5

## 2014-06-16 MED ORDER — SODIUM CHLORIDE 0.9 % IJ SOLN
10.0000 mL | INTRAMUSCULAR | Status: DC | PRN
  Administered 2014-06-16: 10 mL via INTRAVENOUS
  Filled 2014-06-16: qty 10

## 2014-06-16 NOTE — Patient Instructions (Signed)

## 2014-07-20 ENCOUNTER — Other Ambulatory Visit: Payer: Self-pay | Admitting: *Deleted

## 2014-07-20 ENCOUNTER — Other Ambulatory Visit (HOSPITAL_BASED_OUTPATIENT_CLINIC_OR_DEPARTMENT_OTHER): Payer: Self-pay | Admitting: Oncology

## 2014-07-20 ENCOUNTER — Telehealth: Payer: Self-pay | Admitting: Oncology

## 2014-07-20 ENCOUNTER — Other Ambulatory Visit: Payer: Medicaid Other

## 2014-07-20 DIAGNOSIS — C50411 Malignant neoplasm of upper-outer quadrant of right female breast: Secondary | ICD-10-CM

## 2014-07-20 DIAGNOSIS — C50419 Malignant neoplasm of upper-outer quadrant of unspecified female breast: Secondary | ICD-10-CM

## 2014-07-20 LAB — COMPREHENSIVE METABOLIC PANEL (CC13)
ALBUMIN: 3.4 g/dL — AB (ref 3.5–5.0)
ALK PHOS: 60 U/L (ref 40–150)
ALT: 6 U/L (ref 0–55)
ANION GAP: 11 meq/L (ref 3–11)
AST: 9 U/L (ref 5–34)
BUN: 19 mg/dL (ref 7.0–26.0)
CO2: 26 mEq/L (ref 22–29)
Calcium: 11 mg/dL — ABNORMAL HIGH (ref 8.4–10.4)
Chloride: 102 mEq/L (ref 98–109)
Creatinine: 1.4 mg/dL — ABNORMAL HIGH (ref 0.6–1.1)
GLUCOSE: 164 mg/dL — AB (ref 70–140)
Potassium: 3.5 mEq/L (ref 3.5–5.1)
Sodium: 138 mEq/L (ref 136–145)
Total Bilirubin: 0.2 mg/dL (ref 0.20–1.20)
Total Protein: 6.8 g/dL (ref 6.4–8.3)

## 2014-07-20 LAB — CBC WITH DIFFERENTIAL/PLATELET
BASO%: 0.9 % (ref 0.0–2.0)
Basophils Absolute: 0.1 10*3/uL (ref 0.0–0.1)
EOS ABS: 0.6 10*3/uL — AB (ref 0.0–0.5)
EOS%: 5.3 % (ref 0.0–7.0)
HCT: 34.3 % — ABNORMAL LOW (ref 34.8–46.6)
HEMOGLOBIN: 11.3 g/dL — AB (ref 11.6–15.9)
LYMPH%: 14.5 % (ref 14.0–49.7)
MCH: 30.5 pg (ref 25.1–34.0)
MCHC: 32.9 g/dL (ref 31.5–36.0)
MCV: 92.7 fL (ref 79.5–101.0)
MONO#: 0.4 10*3/uL (ref 0.1–0.9)
MONO%: 4.1 % (ref 0.0–14.0)
NEUT%: 75.2 % (ref 38.4–76.8)
NEUTROS ABS: 8.2 10*3/uL — AB (ref 1.5–6.5)
PLATELETS: 277 10*3/uL (ref 145–400)
RBC: 3.7 10*6/uL (ref 3.70–5.45)
RDW: 13.2 % (ref 11.2–14.5)
WBC: 10.9 10*3/uL — ABNORMAL HIGH (ref 3.9–10.3)
lymph#: 1.6 10*3/uL (ref 0.9–3.3)

## 2014-07-20 NOTE — Telephone Encounter (Signed)
, °

## 2014-07-26 NOTE — Progress Notes (Signed)
Patient ID: [name] [name], female   DOB: 1961/11/10, 53 y.o.   MRN: [id number] ID: [name] [name] OB: 17-Jan-1961  MR#: [id number]  CSN#:[id number]  PCP: Ronald Lobo, MD GYN:   SU: Alphonsa Overall, MD OTHER MD: Arloa Koh, MD;  Cleone Slim, MD;  Alphonzo Severance, MD; Crissie Reese, MD  CHIEF COMPLAINT:  Hx of Bilateral Breast Cancers CURRENT TREATMENT: Tamoxifen  HISTORY OF PRESENT ILLNESS: From the original intake note:  [name] had not been able to have mammography for several years because of cost issues. More recently, she had noted a mass in her right breast but thought this might be MRSA. The urinary tract infection took her to her physician's office, and she brought the breast mass to the attention of [name]. She set up the patient for bilateral diagnostic mammography and right breast ultrasound at the [facility] 03/27/2013, which showed a 4 cm irregular mass in the upper outer right breast, with a 9 mm cluster of calcifications in the posterior lower right breast and a few mildly enlarged level I right axillary lymph nodes. The breast mass was palpable and ultrasound showed it to measure 4.1 cm, with some of the level I right axillary lymph nodes noted to have lost their fatty hilum.  Biopsy of both the suspicious areas in the right breast as well as one of the suspicious lymph nodes was performed 03/27/2013 and showed ([id number]) the larger breast mass to consist of an invasive ductal carcinoma with lobular features, grade 2, estrogen and progesterone receptor both 100% positive, with an MIB-1 of 15%, and no HER-2 amplification. 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 The sampled right axillary lymph node was also positive.  Bilateral breast MRI 04/01/2013 showed the larger right breast mass to measure 5.4 cm, with a satellite nodule immediately inferior to it measuring 8 mm. 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 There were multiple enlarged right axillary lymph nodes, the largest measuring 2.8 cm. In addition, in the left breast there were 2 foci on non-masslike enhancement consistent with DCIS measuring 1.3 cm and 2.2 cm. These weren't different quadrants.  The patient's subsequent history is as noted below  INTERVAL HISTORY: [name] returns today accompanied by her mother and her daughter [name] [name] for followup of her bilateral breast cancers. [name] continues on tamoxifen, which she is tolerating well.  REVIEW OF SYSTEMS: [name] is very concerned about problems in her right armpit and has some soreness in her right rib cage. She wonders if her cancer was coming back. Her antidepressant was changed from Lexapro to Celexa, possibly because of concerns about interactions between Lexapro and tamoxifen. However a more recent data has shown that there is no clinical relevance to those interactions and if she wanted to go back on the Lexapro, that would be fine from my point of view. She tells me she is having very strange IBS and some suicidal thoughts on the Celexa. She doesn't have a suicidal plan. She feels tired and walking 10 feet makes her short of breath. She has trouble going up steps. She sleeps very poorly. She has pain in the right axilla chiefly very intermittently. Just some sinus problems, denture issues, and has a poor appetite. She has no money to buy her medications and can only by them one week at a time or if she lost her Medicaid. She has no  insurance. She has pain in her #30 tooth and she thinks it may need to be removed. She has occasional headaches, which are not new and not more frequent or intense than prior. A detailed review of systems today was otherwise stable   PAST MEDICAL HISTORY: Past Medical History  Diagnosis Date  . Hypertension   . Anxiety   . Depression   . Headache(784.0)   . Sleep apnea     cpcp  . Shortness of breath   .  Breast cancer 03/27/13    right  . Hx of radiation therapy 10/02/13- 11/21/13    right chest wall, right supraclav/axillary region 5040 cGy 28 sessions, right chest wall mastectomy scar boost 1000 cGy 5 sessions    PAST SURGICAL HISTORY: Past Surgical History  Procedure Laterality Date  . Mastectomy, radical Right 04/08/2013  . Simple mastectomy with axillary sentinel node biopsy Left 04/08/2013  . Mastectomy modified radical Right 04/07/2013    Procedure: RIGHT MASTECTOMY MODIFIED RADICAL;  Surgeon: Shann Medal, MD;  Location: [place];  Service: General;  Laterality: Right;  . Simple mastectomy with axillary sentinel node biopsy Left 04/07/2013    Procedure:  LEFT SMPLE MASTECTOMY WITH AXILLARY SENTINEL NODE BIOPSY;  Surgeon: Shann Medal, MD;  Location: [place];  Service: General;  Laterality: Left;  . Portacath placement Left 04/07/2013    Procedure: INSERTION PORT-A-CATH;  Surgeon: Shann Medal, MD;  Location: [place];  Service: General;  Laterality: Left;    FAMILY HISTORY Family History  Problem Relation Age of Onset  . Breast cancer Mother 6  . Breast cancer Maternal Grandmother 75  . Prostate cancer Paternal Grandfather 33   the patient's father died at age 87 in an airplane crash (he was a Engineer, maintenance (IT) unsupervised). The patient's mother is alive at age 88. Her name is [name] [name] and she works for [employer]. [name] [name] has a history of breast cancer diagnosed at age 89. She had one sister who is alive and well. [name] [name]'s mother, the patient's grandmother, was diagnosed with breast cancer at age 39. She had 2 sisters, neither with breast cancer. [name] herself had one brother, no sisters. There is no other history of breast or ovary cancer in the family.  GYNECOLOGIC HISTORY:   (updated 02/03/2014)  Menarche age 89, she is GX P92, first live birth age 14. The patient was taking birth control pills until September 2013. She stopped having periods at that time, but had one in late March  2014.  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 Her husband Whitnie Deleon (goes by "[name]") is disabled secondary to diabetes. They were separated for the past 2 years, but [name] recently moved back in with [place]. Charniece lives with her mother [name] [name] and 75 poodles. The patient's daughter [name] "[name] [name]" Musil is 68 and also lives with the patient.    ADVANCED DIRECTIVES: In place. The patient has named her daughter [name] [name] as her healthcare power of attorney, bypassing her husband.  HEALTH MAINTENANCE:  (Updated 02/03/2014) History  Substance Use Topics  . Smoking status: Never Smoker   . Smokeless tobacco: Never Used  . Alcohol Use: No     Colonoscopy: Never  PAP: Possibly 2006, Not on file  Bone density: Possibly 2004, Not on file  Lipid panel: Not on file  Allergies  Allergen Reactions  . Vicodin [Hydrocodone-Acetaminophen] Anaphylaxis  . Codeine Nausea And Vomiting  . Penicillins Rash  . Percocet [  Oxycodone-Acetaminophen] Itching  . Septra [Bactrim] Other (See Comments)    Heart races    Current Outpatient Prescriptions  Medication Sig Dispense Refill  . ALPRAZolam (XANAX) 0.5 MG tablet Take 0.5 mg by mouth 3 (three) times daily as needed for anxiety.      Marland Kitchen diltiazem 2 % GEL Apply 1 application topically 2 (two) times daily.  30 g  1  . escitalopram (LEXAPRO) 10 MG tablet Take 10 mg by mouth daily.       . meloxicam (MOBIC) 15 MG tablet Take 15 mg by mouth daily.      . miconazole (MICOTIN) 2 % powder Apply topically as needed for itching.      . tamoxifen (NOLVADEX) 20 MG tablet Take 1 tablet (20 mg total) by mouth daily.  90 tablet  4  . triamterene-hydrochlorothiazide (MAXZIDE-25) 37.5-25 MG per tablet Take 1 tablet by mouth daily.       No current facility-administered medications for this visit.   Facility-Administered Medications Ordered in Other Visits  Medication Dose Route Frequency Provider Last Rate Last  Dose  . sodium chloride 0.9 % injection 10 mL  10 mL Intravenous PRN Lowella Dell, MD   10 mL at 07/27/14 1033    OBJECTIVE:   Middle-aged white female who appears stated age  53 Vitals:   07/27/14 0954  BP: 148/68  Pulse: 85  Temp: 98.7 F (37.1 C)  Resp: 19     Body mass index is 40.52 kg/(m^2).    ECOG FS: 2 Filed Weights   07/27/14 0954  Weight: 221 lb 9.6 oz (100.517 kg)   Sclerae unicteric, pupils equal and reactive Oropharynx clear and moist; no lesions noted No cervical or supraclavicular adenopathy Lungs no rales or rhonchi Heart regular rate and rhythm Abd soft, obese, nontender, positive bowel sounds MSK no focal spinal tenderness, no upper extremity lymphedema Neuro: nonfocal, well oriented, appropriate affect Breasts: Status post bilateral mastectomies. There are some telangiectasias on the right, but I do not palpate any areas suspicious for disease recurrence in the right axilla or right chest wall. The left chest wall is unremarkable.   LAB RESULTS:  Lab Results  Component Value Date   WBC 10.9* 07/20/2014   NEUTROABS 8.2* 07/20/2014   HGB 11.3* 07/20/2014   HCT 34.3* 07/20/2014   MCV 92.7 07/20/2014   PLT 277 07/20/2014      Chemistry      Component Value Date/Time   NA 138 07/20/2014 1033   NA 138 10/08/2013 1246   K 3.5 07/20/2014 1033   K 3.2* 10/08/2013 1246   CL 100 10/08/2013 1246   CL 103 05/20/2013 1404   CO2 26 07/20/2014 1033   CO2 20 10/08/2013 1246   BUN 19.0 07/20/2014 1033   BUN 13 10/08/2013 1246   CREATININE 1.4* 07/20/2014 1033   CREATININE 1.50* 10/08/2013 1246      Component Value Date/Time   CALCIUM 11.0* 07/20/2014 1033   CALCIUM 9.8 10/08/2013 1246   ALKPHOS 60 07/20/2014 1033   ALKPHOS 63 04/17/2013 0741   AST 9 07/20/2014 1033   AST 23 04/17/2013 0741   ALT 6 07/20/2014 1033   ALT 26 04/17/2013 0741   BILITOT 0.20 07/20/2014 1033   BILITOT 0.2* 04/17/2013 0741       STUDIES: CT CHEST WITH CONTRAST  TECHNIQUE:   Multidetector CT imaging of the chest was performed during  intravenous contrast administration.  CONTRAST: 73mL OMNIPAQUE IOHEXOL 300 MG/ML SOLN  COMPARISON: DG  CHEST 2 VIEW dated 05/21/2013; CT ANGIO CHEST [name]/CM  &/OR WO/CM dated 04/17/2013  FINDINGS:  Changes of prior bilateral mastectomies. Left Port-A-Cath remains in  place, unchanged. Evidence of prior right axillary nodal dissection.  No mediastinal, hilar or axillary adenopathy. Heart is normal size.  Aorta is normal caliber. Scattered coronary artery calcifications in  the left anterior descending and right coronary arteries.  Review of lung windows demonstrates no suspicious pulmonary nodules  or masses. No confluent airspace opacities. No pleural effusions.  Degenerative spurring throughout the mid and lower thoracic spine.  No acute or focal bony abnormality.  IMPRESSION:  Postoperative changes from bilateral mastectomies.  No acute or suspicious pulmonary findings.  Coronary artery disease.  Electronically Signed  By: Rolm Baptise M.D.  On: 02/03/2014 12:53    ASSESSMENT: 53 y.o. 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 There were also 2 suspicious areas in the left breast noted by MRI.  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 Patient's testing pending  (5)  completed adjuvant chemotherapy, consisting of 6 cycles of docetaxel/ doxorubicin/ cyclophosphamide given on a Q. three-week basis, first dose  05/21/2013, last dose 09/03/2013   (6)  postmastectomy radiation therapy, completed 11/21/2013  (7) tamoxifen started December 2014   PLAN:  Serine is tolerating the tamoxifen well and the plan is to continue that at least another year before considering switching to an aromatase inhibitor. She is very anxious about recurrence, and this is not unreasonable. However as far as treatment is concerned she is doing everything she needs to do and I do not see any evidence of disease recurrence or activity at present.  I do think she needs to start an exercise program and today I gave her a copy of the [place] program. If she goes through that she will feel considerably better after 3 months.  There was some concern regarding interactions between Lexapro and other antidepressants and tamoxifen. While it is true that they do affect tamoxifen metabolism, it turns out that that does not affect the clinical benefit of tamoxifen. In short it is fine for her to be on Lexapro and tamoxifen if she and her primary care physician wish to go back on Lexapro (she is feeling very strange on the current antidepressant).  She also has significant financial issues and lost her Medicaid. I am asking our financial counselor to meet with her today to see if we can get that back.  She will see [place] again in 3 months. We will do a chest x-ray before that visit. She knows to call for any problems that may develop before that visit.  Chauncey Cruel, MD   07/27/2014

## 2014-07-27 ENCOUNTER — Ambulatory Visit (HOSPITAL_BASED_OUTPATIENT_CLINIC_OR_DEPARTMENT_OTHER): Payer: Self-pay | Admitting: Oncology

## 2014-07-27 ENCOUNTER — Encounter: Payer: Self-pay | Admitting: Oncology

## 2014-07-27 ENCOUNTER — Other Ambulatory Visit: Payer: Self-pay | Admitting: Oncology

## 2014-07-27 ENCOUNTER — Telehealth: Payer: Self-pay | Admitting: Oncology

## 2014-07-27 ENCOUNTER — Ambulatory Visit (HOSPITAL_BASED_OUTPATIENT_CLINIC_OR_DEPARTMENT_OTHER): Payer: Self-pay

## 2014-07-27 VITALS — BP 148/68 | HR 85 | Temp 98.7°F | Resp 19 | Ht 62.0 in | Wt 221.6 lb

## 2014-07-27 DIAGNOSIS — C773 Secondary and unspecified malignant neoplasm of axilla and upper limb lymph nodes: Secondary | ICD-10-CM

## 2014-07-27 DIAGNOSIS — C50911 Malignant neoplasm of unspecified site of right female breast: Secondary | ICD-10-CM

## 2014-07-27 DIAGNOSIS — C50419 Malignant neoplasm of upper-outer quadrant of unspecified female breast: Secondary | ICD-10-CM

## 2014-07-27 DIAGNOSIS — Z170 Estrogen receptor positive status [ER+]: Secondary | ICD-10-CM

## 2014-07-27 DIAGNOSIS — Z95828 Presence of other vascular implants and grafts: Secondary | ICD-10-CM

## 2014-07-27 MED ORDER — SODIUM CHLORIDE 0.9 % IJ SOLN
10.0000 mL | INTRAMUSCULAR | Status: DC | PRN
  Administered 2014-07-27: 10 mL via INTRAVENOUS
  Filled 2014-07-27: qty 10

## 2014-07-27 MED ORDER — HEPARIN SOD (PORK) LOCK FLUSH 100 UNIT/ML IV SOLN
500.0000 [IU] | Freq: Once | INTRAVENOUS | Status: AC
Start: 2014-07-27 — End: 2014-07-27
  Administered 2014-07-27: 500 [IU] via INTRAVENOUS
  Filled 2014-07-27: qty 5

## 2014-07-27 NOTE — Progress Notes (Signed)
Dr. Jana Hakim referred pt to me regarding her Medicaid.  I called DSS to check eligibility.  As of 05/27/14 to 04/27/15 pt was changed to [facility] when her child turned 53 years old.  She doesn't qualify for full Medicaid.  It was suggested that she apply for disability and get approved for full Medicaid thru them.  Her case worker is Ms. Muller at [phone].  I have relayed this message to the pt and will send her a financial application to apply for assistance thru the hospital.

## 2014-07-27 NOTE — Patient Instructions (Signed)

## 2014-07-27 NOTE — Telephone Encounter (Signed)
, °

## 2014-08-19 ENCOUNTER — Ambulatory Visit: Payer: Self-pay

## 2014-09-07 ENCOUNTER — Ambulatory Visit (HOSPITAL_BASED_OUTPATIENT_CLINIC_OR_DEPARTMENT_OTHER): Payer: Self-pay

## 2014-09-07 VITALS — BP 139/92 | HR 74 | Temp 98.4°F | Resp 16

## 2014-09-07 DIAGNOSIS — C50419 Malignant neoplasm of upper-outer quadrant of unspecified female breast: Secondary | ICD-10-CM

## 2014-09-07 DIAGNOSIS — Z95828 Presence of other vascular implants and grafts: Secondary | ICD-10-CM

## 2014-09-07 DIAGNOSIS — Z452 Encounter for adjustment and management of vascular access device: Secondary | ICD-10-CM

## 2014-09-07 MED ORDER — HEPARIN SOD (PORK) LOCK FLUSH 100 UNIT/ML IV SOLN
500.0000 [IU] | Freq: Once | INTRAVENOUS | Status: AC
Start: 2014-09-07 — End: 2014-09-07
  Administered 2014-09-07: 500 [IU] via INTRAVENOUS
  Filled 2014-09-07: qty 5

## 2014-09-07 MED ORDER — SODIUM CHLORIDE 0.9 % IJ SOLN
10.0000 mL | INTRAMUSCULAR | Status: DC | PRN
  Administered 2014-09-07: 10 mL via INTRAVENOUS
  Filled 2014-09-07: qty 10

## 2014-09-07 NOTE — Patient Instructions (Signed)

## 2014-10-19 ENCOUNTER — Ambulatory Visit (HOSPITAL_BASED_OUTPATIENT_CLINIC_OR_DEPARTMENT_OTHER): Payer: Self-pay

## 2014-10-19 ENCOUNTER — Ambulatory Visit (HOSPITAL_COMMUNITY)
Admission: RE | Admit: 2014-10-19 | Discharge: 2014-10-19 | Disposition: A | Discharge status: Home / Self Care | Payer: Self-pay | Source: Ambulatory Visit | Attending: Oncology | Admitting: Oncology

## 2014-10-19 ENCOUNTER — Other Ambulatory Visit (HOSPITAL_BASED_OUTPATIENT_CLINIC_OR_DEPARTMENT_OTHER): Payer: Self-pay

## 2014-10-19 ENCOUNTER — Encounter: Payer: Self-pay | Admitting: Nurse Practitioner

## 2014-10-19 ENCOUNTER — Ambulatory Visit (HOSPITAL_BASED_OUTPATIENT_CLINIC_OR_DEPARTMENT_OTHER): Payer: Self-pay | Admitting: Nurse Practitioner

## 2014-10-19 ENCOUNTER — Telehealth: Payer: Self-pay | Admitting: Nurse Practitioner

## 2014-10-19 VITALS — BP 168/95 | HR 75

## 2014-10-19 VITALS — BP 153/87 | HR 75 | Temp 97.7°F | Resp 20 | Ht 62.0 in | Wt 223.0 lb

## 2014-10-19 DIAGNOSIS — R05 Cough: Secondary | ICD-10-CM | POA: Insufficient documentation

## 2014-10-19 DIAGNOSIS — C50911 Malignant neoplasm of unspecified site of right female breast: Secondary | ICD-10-CM

## 2014-10-19 DIAGNOSIS — C773 Secondary and unspecified malignant neoplasm of axilla and upper limb lymph nodes: Secondary | ICD-10-CM

## 2014-10-19 DIAGNOSIS — Z9221 Personal history of antineoplastic chemotherapy: Secondary | ICD-10-CM | POA: Insufficient documentation

## 2014-10-19 DIAGNOSIS — C50411 Malignant neoplasm of upper-outer quadrant of right female breast: Secondary | ICD-10-CM

## 2014-10-19 DIAGNOSIS — Z9013 Acquired absence of bilateral breasts and nipples: Secondary | ICD-10-CM | POA: Insufficient documentation

## 2014-10-19 DIAGNOSIS — Z170 Estrogen receptor positive status [ER+]: Secondary | ICD-10-CM

## 2014-10-19 DIAGNOSIS — Z853 Personal history of malignant neoplasm of breast: Secondary | ICD-10-CM | POA: Insufficient documentation

## 2014-10-19 DIAGNOSIS — R911 Solitary pulmonary nodule: Secondary | ICD-10-CM

## 2014-10-19 DIAGNOSIS — Z95828 Presence of other vascular implants and grafts: Secondary | ICD-10-CM

## 2014-10-19 LAB — CBC WITH DIFFERENTIAL/PLATELET
BASO%: 0.3 % (ref 0.0–2.0)
Basophils Absolute: 0 10*3/uL (ref 0.0–0.1)
EOS%: 6.6 % (ref 0.0–7.0)
Eosinophils Absolute: 0.6 10*3/uL — ABNORMAL HIGH (ref 0.0–0.5)
HCT: 33.2 % — ABNORMAL LOW (ref 34.8–46.6)
HGB: 11.1 g/dL — ABNORMAL LOW (ref 11.6–15.9)
LYMPH%: 15.2 % (ref 14.0–49.7)
MCH: 30.8 pg (ref 25.1–34.0)
MCHC: 33.4 g/dL (ref 31.5–36.0)
MCV: 92.2 fL (ref 79.5–101.0)
MONO#: 0.5 10*3/uL (ref 0.1–0.9)
MONO%: 5.2 % (ref 0.0–14.0)
NEUT#: 7.1 10*3/uL — ABNORMAL HIGH (ref 1.5–6.5)
NEUT%: 72.7 % (ref 38.4–76.8)
Platelets: 240 10*3/uL (ref 145–400)
RBC: 3.6 10*6/uL — AB (ref 3.70–5.45)
RDW: 12.6 % (ref 11.2–14.5)
WBC: 9.7 10*3/uL (ref 3.9–10.3)
lymph#: 1.5 10*3/uL (ref 0.9–3.3)

## 2014-10-19 LAB — COMPREHENSIVE METABOLIC PANEL (CC13)
ALBUMIN: 3.4 g/dL — AB (ref 3.5–5.0)
ALK PHOS: 66 U/L (ref 40–150)
ALT: 7 U/L (ref 0–55)
AST: 12 U/L (ref 5–34)
Anion Gap: 9 mEq/L (ref 3–11)
BUN: 18.8 mg/dL (ref 7.0–26.0)
CO2: 26 mEq/L (ref 22–29)
Calcium: 9.2 mg/dL (ref 8.4–10.4)
Chloride: 104 mEq/L (ref 98–109)
Creatinine: 1.2 mg/dL — ABNORMAL HIGH (ref 0.6–1.1)
Glucose: 143 mg/dl — ABNORMAL HIGH (ref 70–140)
POTASSIUM: 3.8 meq/L (ref 3.5–5.1)
SODIUM: 139 meq/L (ref 136–145)
TOTAL PROTEIN: 6.8 g/dL (ref 6.4–8.3)
Total Bilirubin: <0.2 mg/dL (ref 0.20–1.20)

## 2014-10-19 MED ORDER — SODIUM CHLORIDE 0.9 % IJ SOLN
10.0000 mL | INTRAMUSCULAR | Status: DC | PRN
  Administered 2014-10-19: 10 mL via INTRAVENOUS
  Filled 2014-10-19: qty 10

## 2014-10-19 MED ORDER — HEPARIN SOD (PORK) LOCK FLUSH 100 UNIT/ML IV SOLN
500.0000 [IU] | Freq: Once | INTRAVENOUS | Status: AC
Start: 2014-10-19 — End: 2014-10-19
  Administered 2014-10-19: 500 [IU] via INTRAVENOUS
  Filled 2014-10-19: qty 5

## 2014-10-19 NOTE — Patient Instructions (Signed)

## 2014-10-19 NOTE — Telephone Encounter (Signed)
, °

## 2014-10-19 NOTE — Progress Notes (Signed)
Patient ID: [name] [name], female   DOB: 03-26-1961, 53 y.o.   MRN: [id number] ID: [name] [name] OB: 17-Dec-1960  MR#: [id number]  QVZ#:[id number]  PCP: Ronald Lobo, MD GYN:   SU: Alphonsa Overall, MD OTHER MD: Arloa Koh, MD;  Cleone Slim, MD;  Alphonzo Severance, MD; Crissie Reese, MD  CHIEF COMPLAINT:  Hx of Bilateral Breast Cancers CURRENT TREATMENT: Tamoxifen  BREAST CANCER HISTORY: From the original intake note:  [name] [name] had not been able to have mammography for several years because of cost issues. More recently, she had noted a mass in her right breast but thought this might be MRSA. The urinary tract infection took her to her physician's office, and she brought the breast mass to the attention of [name] Pisciotta NP. She set up the patient for bilateral diagnostic mammography and right breast ultrasound at the breast Center 03/27/2013, which showed a 4 cm irregular mass in the upper outer right breast, with a 9 mm cluster of calcifications in the posterior lower right breast and a few mildly enlarged level I right axillary lymph nodes. The breast mass was palpable and ultrasound showed it to measure 4.1 cm, with some of the level I right axillary lymph nodes noted to have lost their fatty hilum.  Biopsy of both the suspicious areas in the right breast as well as one of the suspicious lymph nodes was performed 03/27/2013 and showed ([id number] the larger breast mass to consist of an invasive ductal carcinoma with lobular features, grade 2, estrogen and progesterone receptor both 100% positive, with an MIB-1 of 15%, and no HER-2 amplification. 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 The sampled right axillary lymph node was also positive.  Bilateral breast MRI 04/01/2013 showed the larger right breast mass to measure 5.4 cm, with a satellite nodule immediately inferior to it measuring 8 mm. 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 There were multiple enlarged right axillary lymph nodes, the largest measuring 2.8 cm. In addition, in the left breast there were 2 foci on non-masslike enhancement consistent with DCIS measuring 1.3 cm and 2.2 cm. These weren't different quadrants.  The patient's subsequent history is as noted below  INTERVAL HISTORY: [name] [name] returns today for follow up of her bilateral breast cancers, accompanied by her mother. [name] [name] continues on tamoxifen, which she is tolerating well with the exception for nightly hot flashes. She denies vaginal changes or arthralgias/myalgias. Unfortunately [name] [name] is in a financial struggle and can only afford a week's supply of tamoxifen at a time. For the past few months she has skipped almost every other week. Her PCP changed her from celexa to lexapro because of suicidal ideations. [name] [name] still battles depression and anxiety but has no more of these self-harming thoughts. She complains of short term memory loss for the past month, forgetting things that she has already told her mother. [name] [name] was also started on metoprolol for her "heart condition" but can't afford to return to her cardiologist. The interval history is otherwise unremarkable.   REVIEW OF SYSTEMS: [name] [name] denies fevers, chills, nausea, vomiting, or changes in bowel or bladder habits. Despite soft, regular stools, [name] [name] complains of rectal fissures for which applies diltiazem gel for relief. She has some shortness of breath with exertion, but denies chest pain, cough, palpitation, or fatigue. She denies headaches, dizziness, or unexplained bleeding or weight loss. A detailed review of systems is  otherwise noncontributory.    PAST MEDICAL HISTORY: Past Medical History  Diagnosis Date  . Hypertension   . Anxiety   . Depression   . Headache(784.0)   . Sleep apnea     cpcp  . Shortness of breath   . Breast cancer 03/27/13    right  . Hx of radiation therapy 10/02/13- 11/21/13     right chest wall, right supraclav/axillary region 5040 cGy 28 sessions, right chest wall mastectomy scar boost 1000 cGy 5 sessions    PAST SURGICAL HISTORY: Past Surgical History  Procedure Laterality Date  . Mastectomy, radical Right 04/08/2013  . Simple mastectomy with axillary sentinel node biopsy Left 04/08/2013  . Mastectomy modified radical Right 04/07/2013    Procedure: RIGHT MASTECTOMY MODIFIED RADICAL;  Surgeon: Shann Medal, MD;  Location: [facility];  Service: General;  Laterality: Right;  . Simple mastectomy with axillary sentinel node biopsy Left 04/07/2013    Procedure:  LEFT SMPLE MASTECTOMY WITH AXILLARY SENTINEL NODE BIOPSY;  Surgeon: Shann Medal, MD;  Location: [facility];  Service: General;  Laterality: Left;  . Portacath placement Left 04/07/2013    Procedure: INSERTION PORT-A-CATH;  Surgeon: Shann Medal, MD;  Location: [facility];  Service: General;  Laterality: Left;    FAMILY HISTORY Family History  Problem Relation Age of Onset  . Breast cancer Mother 74  . Breast cancer Maternal Grandmother 27  . Prostate cancer Paternal Grandfather 63   the patient's father died at age 1 in an airplane crash (he was a Engineer, maintenance (IT) unsupervised). The patient's mother is alive at age 70. Her name is [name] [name] and she works for [employer]. [name] [name] has a history of breast cancer diagnosed at age 7. She had one sister who is alive and well. [name] [name]'s mother, the patient's grandmother, was diagnosed with breast cancer at age 58. She had 2 sisters, neither with breast cancer. Kiaraliz herself had one brother, no sisters. There is no other history of breast or ovary cancer in the family.  GYNECOLOGIC HISTORY:   (updated 02/03/2014)  Menarche age 38, she is GX P78, first live birth age 41. The patient was taking birth control pills until September 2013. She stopped having periods at that time, but had one in late March 2014.  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 Her husband [name] [name] (goes by "[name]") is disabled secondary to diabetes. They were separated for the past 2 years, but [name] recently moved back in with [place]. Danett lives with her mother [name] [name] and 16 poodles. The patient's daughter [name] "[name]" Clune is 45 and also lives with the patient.    ADVANCED DIRECTIVES: In place. The patient has named her daughter [name] as her healthcare power of attorney, bypassing her husband.  HEALTH MAINTENANCE:  (Updated 02/03/2014) History  Substance Use Topics  . Smoking status: Never Smoker   . Smokeless tobacco: Never Used  . Alcohol Use: No     Colonoscopy: Never  PAP: Possibly 2006, Not on file  Bone density: Possibly 2004, Not on file  Lipid panel: Not on file  Allergies  Allergen Reactions  . Vicodin [Hydrocodone-Acetaminophen] Anaphylaxis  . Codeine Nausea And Vomiting  . Penicillins Rash  . Percocet [Oxycodone-Acetaminophen] Itching  . Septra [Bactrim] Other (See Comments)    Heart races    Current Outpatient Prescriptions  Medication Sig Dispense Refill  . ALPRAZolam (XANAX) 0.5 MG tablet Take 0.5 mg by mouth 3 (three)  times daily as needed for anxiety.    Marland Kitchen diltiazem 2 % GEL Apply 1 application topically 2 (two) times daily. 30 g 1  . escitalopram (LEXAPRO) 10 MG tablet Take 10 mg by mouth daily.     . meloxicam (MOBIC) 15 MG tablet Take 15 mg by mouth daily.    . miconazole (MICOTIN) 2 % powder Apply topically as needed for itching.    . tamoxifen (NOLVADEX) 20 MG tablet Take 1 tablet (20 mg total) by mouth daily. 90 tablet 4  . triamterene-hydrochlorothiazide (MAXZIDE-25) 37.5-25 MG per tablet Take 1 tablet by mouth daily.     No current facility-administered medications for this visit.   Facility-Administered Medications Ordered in Other Visits  Medication Dose Route Frequency Provider Last Rate Last Dose  . sodium chloride 0.9 % injection 10 mL  10 mL Intravenous PRN Marcelino Duster, NP   10  mL at 10/19/14 0847    OBJECTIVE:   Middle-aged white female who appears stated age  64 Vitals:   10/19/14 0900  BP: 153/87  Pulse: 75  Temp: 97.7 F (36.5 C)  Resp: 20     Body mass index is 40.78 kg/(m^2).    ECOG FS: 2 Filed Weights   10/19/14 0900  Weight: 223 lb (101.152 kg)   Sclerae unicteric, pupils equal and reactive Oropharynx clear and moist; no lesions noted No cervical or supraclavicular adenopathy Lungs no rales or rhonchi Heart regular rate and rhythm Abd soft, obese, nontender, positive bowel sounds MSK no focal spinal tenderness, no upper extremity lymphedema Neuro: nonfocal, well oriented, appropriate affect Breasts: Status post bilateral mastectomies. There are some telangiectasias on the right, but I do not palpate any areas suspicious for disease recurrence in the right axilla or right chest wall. The left chest wall is unremarkable.  Skin: warm, dry  HEENT: sclerae anicteric, conjunctivae pink, oropharynx clear. No thrush or mucositis.  Lymph Nodes: No cervical or supraclavicular lymphadenopathy  Lungs: clear to auscultation bilaterally, no rales, wheezes, or rhonci  Heart: regular rate and rhythm  Abdomen: round, soft, non tender, positive bowel sounds  Musculoskeletal: No focal spinal tenderness, no peripheral edema  Neuro: non focal, well oriented, positive affect  Breast: bilateral breasts status post mastectomies.no evidence of local recurrence. Bilateral axillae benign.    LAB RESULTS:  Lab Results  Component Value Date   WBC 9.7 10/19/2014   NEUTROABS 7.1* 10/19/2014   HGB 11.1* 10/19/2014   HCT 33.2* 10/19/2014   MCV 92.2 10/19/2014   PLT 240 10/19/2014      Chemistry      Component Value Date/Time   NA 138 07/20/2014 1033   NA 138 10/08/2013 1246   K 3.5 07/20/2014 1033   K 3.2* 10/08/2013 1246   CL 100 10/08/2013 1246   CL 103 05/20/2013 1404   CO2 26 07/20/2014 1033   CO2 20 10/08/2013 1246   BUN 19.0 07/20/2014 1033    BUN 13 10/08/2013 1246   CREATININE 1.4* 07/20/2014 1033   CREATININE 1.50* 10/08/2013 1246      Component Value Date/Time   CALCIUM 11.0* 07/20/2014 1033   CALCIUM 9.8 10/08/2013 1246   ALKPHOS 60 07/20/2014 1033   ALKPHOS 63 04/17/2013 0741   AST 9 07/20/2014 1033   AST 23 04/17/2013 0741   ALT 6 07/20/2014 1033   ALT 26 04/17/2013 0741   BILITOT 0.20 07/20/2014 1033   BILITOT 0.2* 04/17/2013 0741       STUDIES: No results found.  ASSESSMENT: 53 y.o. 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 There were also 2 suspicious areas in the left breast noted by MRI.  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 Patient's testing pending  (5)  completed adjuvant chemotherapy, consisting of 6 cycles of docetaxel/ doxorubicin/ cyclophosphamide given on a Q. three-week basis, first dose  05/21/2013, last dose 09/03/2013   (6)  postmastectomy radiation therapy, completed 11/21/2013  (7) tamoxifen started December 2014   PLAN:  Kem is doing well as far as her breast cancer is concerned. She is tolerating the tamoxifen well and the goal is to continue it for at least an additional year before considering a switch to an  aromatase inhibitor. I suggested she price shop this prescription at another pharmacy. Some of our other uninsured patients are obtaining the drug at a good price at places like [facility].   Ivi has not gotten her chest Xray to follow up on an old pulmonary nodule yet, so she will head to radiology after this visit. She had her port flushed today and plans to keep it, so she will continue port flushes every 6 weeks moving forward.  Hermione will return in 3 months for labs and a follow up visit. She understands and agrees with this plan. She knows the goal of treatment in her case is cure. She has been encouraged to call with any issues that might arise before her next visit here.  Marcelino Duster, NP   10/19/2014

## 2014-10-29 ENCOUNTER — Other Ambulatory Visit: Payer: Self-pay | Admitting: *Deleted

## 2014-10-29 DIAGNOSIS — C50411 Malignant neoplasm of upper-outer quadrant of right female breast: Secondary | ICD-10-CM

## 2014-10-29 MED ORDER — TAMOXIFEN CITRATE 20 MG PO TABS: 20.0000 mg | ORAL_TABLET | Freq: Every day | ORAL | Status: DC

## 2014-11-30 ENCOUNTER — Ambulatory Visit (HOSPITAL_BASED_OUTPATIENT_CLINIC_OR_DEPARTMENT_OTHER): Payer: Self-pay

## 2014-11-30 VITALS — BP 135/66 | HR 72 | Temp 98.3°F

## 2014-11-30 DIAGNOSIS — Z95828 Presence of other vascular implants and grafts: Secondary | ICD-10-CM

## 2014-11-30 DIAGNOSIS — C50411 Malignant neoplasm of upper-outer quadrant of right female breast: Secondary | ICD-10-CM

## 2014-11-30 DIAGNOSIS — Z452 Encounter for adjustment and management of vascular access device: Secondary | ICD-10-CM

## 2014-11-30 MED ORDER — SODIUM CHLORIDE 0.9 % IJ SOLN
10.0000 mL | INTRAMUSCULAR | Status: DC | PRN
  Administered 2014-11-30: 10 mL via INTRAVENOUS
  Filled 2014-11-30: qty 10

## 2014-11-30 MED ORDER — HEPARIN SOD (PORK) LOCK FLUSH 100 UNIT/ML IV SOLN
500.0000 [IU] | Freq: Once | INTRAVENOUS | Status: AC
  Administered 2014-11-30: 500 [IU] via INTRAVENOUS
  Filled 2014-11-30: qty 5

## 2014-11-30 NOTE — Patient Instructions (Signed)

## 2015-01-19 ENCOUNTER — Ambulatory Visit (HOSPITAL_BASED_OUTPATIENT_CLINIC_OR_DEPARTMENT_OTHER): Payer: Self-pay

## 2015-01-19 ENCOUNTER — Encounter: Payer: Self-pay | Admitting: Nurse Practitioner

## 2015-01-19 ENCOUNTER — Telehealth: Payer: Self-pay | Admitting: Oncology

## 2015-01-19 ENCOUNTER — Other Ambulatory Visit (HOSPITAL_BASED_OUTPATIENT_CLINIC_OR_DEPARTMENT_OTHER): Payer: Self-pay

## 2015-01-19 ENCOUNTER — Ambulatory Visit (HOSPITAL_BASED_OUTPATIENT_CLINIC_OR_DEPARTMENT_OTHER): Payer: Self-pay | Admitting: Nurse Practitioner

## 2015-01-19 ENCOUNTER — Telehealth: Payer: Self-pay | Admitting: Nurse Practitioner

## 2015-01-19 VITALS — BP 139/65 | HR 69 | Temp 97.9°F | Wt 226.2 lb

## 2015-01-19 DIAGNOSIS — Z79810 Long term (current) use of selective estrogen receptor modulators (SERMs): Secondary | ICD-10-CM

## 2015-01-19 DIAGNOSIS — Z853 Personal history of malignant neoplasm of breast: Secondary | ICD-10-CM

## 2015-01-19 DIAGNOSIS — N289 Disorder of kidney and ureter, unspecified: Secondary | ICD-10-CM

## 2015-01-19 DIAGNOSIS — R7989 Other specified abnormal findings of blood chemistry: Secondary | ICD-10-CM

## 2015-01-19 DIAGNOSIS — C50411 Malignant neoplasm of upper-outer quadrant of right female breast: Secondary | ICD-10-CM

## 2015-01-19 DIAGNOSIS — R32 Unspecified urinary incontinence: Secondary | ICD-10-CM

## 2015-01-19 DIAGNOSIS — C50911 Malignant neoplasm of unspecified site of right female breast: Secondary | ICD-10-CM

## 2015-01-19 DIAGNOSIS — Z95828 Presence of other vascular implants and grafts: Secondary | ICD-10-CM

## 2015-01-19 DIAGNOSIS — N393 Stress incontinence (female) (male): Secondary | ICD-10-CM

## 2015-01-19 LAB — CBC WITH DIFFERENTIAL/PLATELET
BASO%: 0.2 % (ref 0.0–2.0)
BASOS ABS: 0 10*3/uL (ref 0.0–0.1)
EOS%: 5.2 % (ref 0.0–7.0)
Eosinophils Absolute: 0.5 10*3/uL (ref 0.0–0.5)
HCT: 35.6 % (ref 34.8–46.6)
HEMOGLOBIN: 11.6 g/dL (ref 11.6–15.9)
LYMPH#: 1.8 10*3/uL (ref 0.9–3.3)
LYMPH%: 17.3 % (ref 14.0–49.7)
MCH: 30.4 pg (ref 25.1–34.0)
MCHC: 32.7 g/dL (ref 31.5–36.0)
MCV: 93 fL (ref 79.5–101.0)
MONO#: 0.5 10*3/uL (ref 0.1–0.9)
MONO%: 4.8 % (ref 0.0–14.0)
NEUT#: 7.4 10*3/uL — ABNORMAL HIGH (ref 1.5–6.5)
NEUT%: 72.5 % (ref 38.4–76.8)
Platelets: 289 10*3/uL (ref 145–400)
RBC: 3.82 10*6/uL (ref 3.70–5.45)
RDW: 13.2 % (ref 11.2–14.5)
WBC: 10.3 10*3/uL (ref 3.9–10.3)

## 2015-01-19 LAB — COMPREHENSIVE METABOLIC PANEL (CC13)
ALBUMIN: 3.6 g/dL (ref 3.5–5.0)
ALT: 8 U/L (ref 0–55)
ANION GAP: 12 meq/L — AB (ref 3–11)
AST: 13 U/L (ref 5–34)
Alkaline Phosphatase: 55 U/L (ref 40–150)
BUN: 19.4 mg/dL (ref 7.0–26.0)
CALCIUM: 9.4 mg/dL (ref 8.4–10.4)
CHLORIDE: 105 meq/L (ref 98–109)
CO2: 23 meq/L (ref 22–29)
Creatinine: 1.3 mg/dL — ABNORMAL HIGH (ref 0.6–1.1)
EGFR: 47 mL/min/{1.73_m2} — ABNORMAL LOW (ref >90)
Glucose: 150 mg/dl — ABNORMAL HIGH (ref 70–140)
POTASSIUM: 3.8 meq/L (ref 3.5–5.1)
SODIUM: 140 meq/L (ref 136–145)
Total Bilirubin: <0.2 mg/dL (ref 0.20–1.20)
Total Protein: 7 g/dL (ref 6.4–8.3)

## 2015-01-19 LAB — URINALYSIS, MICROSCOPIC - CHCC
BILIRUBIN (URINE): NEGATIVE
Glucose: NEGATIVE mg/dL
Ketones: NEGATIVE mg/dL
Nitrite: NEGATIVE
PH: 7 (ref 4.6–8.0)
Protein: NEGATIVE mg/dL
Specific Gravity, Urine: 1.01 (ref 1.003–1.035)
Urobilinogen, UR: 0.2 mg/dL (ref 0.2–1)

## 2015-01-19 MED ORDER — SODIUM CHLORIDE 0.9 % IJ SOLN
10.0000 mL | INTRAMUSCULAR | Status: DC | PRN
  Administered 2015-01-19: 10 mL via INTRAVENOUS
  Filled 2015-01-19: qty 10

## 2015-01-19 MED ORDER — HEPARIN SOD (PORK) LOCK FLUSH 100 UNIT/ML IV SOLN
500.0000 [IU] | Freq: Once | INTRAVENOUS | Status: AC
  Administered 2015-01-19: 500 [IU] via INTRAVENOUS
  Filled 2015-01-19: qty 5

## 2015-01-19 NOTE — Telephone Encounter (Signed)
, °

## 2015-01-19 NOTE — Patient Instructions (Signed)

## 2015-01-19 NOTE — Progress Notes (Signed)
Patient ID: [name] [name], female   DOB: 21-Oct-1961, 54 y.o.   MRN: [id number] ID: [name] [name] OB: 12-18-1960  MR#: [id number]  GEZ#:[id number]  PCP: [name] Peters, MD GYN:   SU: [name] Overall, MD OTHER MD: [name] Koh, MD;  [name] Slim, MD;  [name] Severance, MD; [name] Reese, MD  [name] COMPLAINT:  Hx of Bilateral Breast Cancers CURRENT TREATMENT: Tamoxifen  BREAST CANCER HISTORY: From the original intake note:  [name] [name] had not been able to have mammography for several years because of cost issues. More recently, she had noted a mass in her right breast but thought this might be MRSA. The urinary tract infection took her to her physician's office, and she brought the breast mass to the attention of [name] Pisciotta NP. She set up the patient for bilateral diagnostic mammography and right breast ultrasound at the breast Center 03/27/2013, which showed a 4 cm irregular mass in the upper outer right breast, with a 9 mm cluster of calcifications in the posterior lower right breast and a few mildly enlarged level I right axillary lymph nodes. The breast mass was palpable and ultrasound showed it to measure 4.1 cm, with some of the level I right axillary lymph nodes noted to have lost their fatty hilum.  Biopsy of both the suspicious areas in the right breast as well as one of the suspicious lymph nodes was performed 03/27/2013 and showed ([name] [name]) the larger breast mass to consist of an invasive ductal carcinoma with lobular features, grade 2, estrogen and progesterone receptor both 100% positive, with an MIB-1 of 15%, and no HER-2 amplification. 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 The sampled right axillary lymph node was also positive.  Bilateral breast MRI 04/01/2013 showed the larger right breast mass to measure 5.4 cm, with a satellite nodule immediately inferior to it measuring 8 mm. 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 There were multiple enlarged right axillary lymph nodes, the largest measuring 2.8 cm. In addition, in the left breast there were 2 foci on non-masslike enhancement consistent with DCIS measuring 1.3 cm and 2.2 cm. These weren't different quadrants.  The patient's subsequent history is as noted below  INTERVAL HISTORY: [name] [name] returns today for follow up of her bilateral breast cancers, accompanied by her mother and daughter. She continues to have trouble obtaining her medicines and has been off of tamoxifen and her metoprolol for 4 days. She is pursuing medical disability and has hired a [name] [name]. She needs a letter from this clinic stating a list of reasons as to why she can not return to work. She claims that her short term memory and fatigue keep her from holding conventional jobs. She is chronically depressed, but is doing better back on lexapro instead of celexa, which caused suicidal ideations. She has chronic back pain.  REVIEW OF SYSTEMS: [name] [name] denies fevers, chills, nausea, vomiting, or changes in bowel habits. For one month she has complained of urinary stress incontinence. She denies dysuria. Her appetite is good. Sleep is an issue. She did better with a xanax QHS, but her PCP stopped this for some reason. She has some shortness of breath with exertion, but denies chest pain, cough, or palpitations. She denies headaches, dizziness, or unexplained bleeding or weight loss. A detailed review of systems is otherwise noncontributory.    PAST MEDICAL HISTORY: Past Medical History  Diagnosis Date  .  Hypertension   . Anxiety   . Depression   . Headache(784.0)   . Sleep apnea     cpcp  . Shortness of breath   . Breast cancer 03/27/13    right  . Hx of radiation therapy 10/02/13- 11/21/13    right chest wall, right supraclav/axillary region 5040 cGy 28 sessions, right chest wall mastectomy scar boost 1000 cGy 5 sessions    PAST SURGICAL  HISTORY: Past Surgical History  Procedure Laterality Date  . Mastectomy, radical Right 04/08/2013  . Simple mastectomy with axillary sentinel node biopsy Left 04/08/2013  . Mastectomy modified radical Right 04/07/2013    Procedure: RIGHT MASTECTOMY MODIFIED RADICAL;  Surgeon: [name] Medal, MD;  Location: [facility];  Service: General;  Laterality: Right;  . Simple mastectomy with axillary sentinel node biopsy Left 04/07/2013    Procedure:  LEFT SMPLE MASTECTOMY WITH AXILLARY SENTINEL NODE BIOPSY;  Surgeon: [name] Medal, MD;  Location: [facility];  Service: General;  Laterality: Left;  . Portacath placement Left 04/07/2013    Procedure: INSERTION PORT-A-CATH;  Surgeon: [name] Medal, MD;  Location: [facility];  Service: General;  Laterality: Left;    FAMILY HISTORY Family History  Problem Relation Age of Onset  . Breast cancer Mother 36  . Breast cancer Maternal Grandmother 19  . Prostate cancer Paternal Grandfather 66   the patient's father died at age 92 in an airplane crash (he was a Engineer, maintenance (IT) unsupervised). The patient's mother is alive at age 46. Her name is [name] [name] and she works for [employer]. [name] [name] has a history of breast cancer diagnosed at age 71. She had one sister who is alive and well. [name] [name]'s mother, the patient's grandmother, was diagnosed with breast cancer at age 3. She had 2 sisters, neither with breast cancer. [name] herself had one brother, no sisters. There is no other history of breast or ovary cancer in the family.  GYNECOLOGIC HISTORY:   (updated 02/03/2014)  Menarche age 87, she is GX P58, first live birth age 89. The patient was taking birth control pills until September 2013. She stopped having periods at that time, but had one in late March 2014.  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 Her husband [name] [name] (goes by "[name] [name]") is disabled secondary to diabetes. They were separated for the past 2 years, but  [name] [name] recently moved back in with [name] [name]. [name] [name] lives with her mother [name] [name] and 7 poodles. The patient's daughter [name] "[name]" Quest is 90 and also lives with the patient.    ADVANCED DIRECTIVES: In place. The patient has named her daughter [name] as her healthcare power of attorney, bypassing her husband.  HEALTH MAINTENANCE:  (Updated 02/03/2014) History  Substance Use Topics  . Smoking status: Never Smoker   . Smokeless tobacco: Never Used  . Alcohol Use: No     Colonoscopy: Never  PAP: Possibly 2006, Not on file  Bone density: Possibly 2004, Not on file  Lipid panel: Not on file  Allergies  Allergen Reactions  . Vicodin [Hydrocodone-Acetaminophen] Anaphylaxis  . Codeine Nausea And Vomiting  . Penicillins Rash  . Percocet [Oxycodone-Acetaminophen] Itching  . Septra [Bactrim] Other (See Comments)    Heart races    Current Outpatient Prescriptions  Medication Sig Dispense Refill  . ALPRAZolam (XANAX) 0.5 MG tablet Take 0.5 mg by mouth 2 (two) times daily as needed for anxiety.     Marland Kitchen diltiazem 2 % GEL Apply  1 application topically 2 (two) times daily. 30 g 1  . escitalopram (LEXAPRO) 20 MG tablet Take 20 mg by mouth daily.    . meloxicam (MOBIC) 15 MG tablet Take 15 mg by mouth daily.    Marland Kitchen triamterene-hydrochlorothiazide (MAXZIDE-25) 37.5-25 MG per tablet Take 1 tablet by mouth daily.    . metoprolol succinate (TOPROL-XL) 25 MG 24 hr tablet Take 25 mg by mouth daily.    . miconazole (MICOTIN) 2 % powder Apply topically as needed for itching.    . tamoxifen (NOLVADEX) 20 MG tablet Take 1 tablet (20 mg total) by mouth daily. (Patient not taking: Reported on 01/19/2015) 90 tablet 0   No current facility-administered medications for this visit.    OBJECTIVE:   Middle-aged white female who appears stated age  There were no vitals filed for this visit.   There is no weight on file to calculate BMI.    ECOG FS: 2 There were no vitals filed for this visit.  Skin: warm, dry   HEENT: sclerae anicteric, conjunctivae pink, oropharynx clear. No thrush or mucositis.  Lymph Nodes: No cervical or supraclavicular lymphadenopathy  Lungs: clear to auscultation bilaterally, no rales, wheezes, or rhonci  Heart: regular rate and rhythm  Abdomen: round, soft, non tender, positive bowel sounds  Musculoskeletal: No focal spinal tenderness, no peripheral edema  Neuro: non focal, well oriented, positive affect  Breasts: bilateral breast status post mastectomies. Dryness/hyperpigmentation along right incision line. No evidence of local recurrence. Bilateral axillae benign.    LAB RESULTS:  Lab Results  Component Value Date   WBC 10.3 01/19/2015   NEUTROABS 7.4* 01/19/2015   HGB 11.6 01/19/2015   HCT 35.6 01/19/2015   MCV 93.0 01/19/2015   PLT 289 01/19/2015      Chemistry      Component Value Date/Time   NA 140 01/19/2015 1031   NA 138 10/08/2013 1246   K 3.8 01/19/2015 1031   K 3.2* 10/08/2013 1246   CL 100 10/08/2013 1246   CL 103 05/20/2013 1404   CO2 23 01/19/2015 1031   CO2 20 10/08/2013 1246   BUN 19.4 01/19/2015 1031   BUN 13 10/08/2013 1246   CREATININE 1.3* 01/19/2015 1031   CREATININE 1.50* 10/08/2013 1246      Component Value Date/Time   CALCIUM 9.4 01/19/2015 1031   CALCIUM 9.8 10/08/2013 1246   ALKPHOS 55 01/19/2015 1031   ALKPHOS 63 04/17/2013 0741   AST 13 01/19/2015 1031   AST 23 04/17/2013 0741   ALT 8 01/19/2015 1031   ALT 26 04/17/2013 0741   BILITOT <0.20 01/19/2015 1031   BILITOT 0.2* 04/17/2013 0741       STUDIES: No results found.  ASSESSMENT: 54 y.o. BRCA1 and 2 negative 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 There were also 2 suspicious areas in the left breast noted by MRI.  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 Patient's testing pending  (5)  completed adjuvant chemotherapy, consisting of 6 cycles of docetaxel/ doxorubicin/ cyclophosphamide given on a Q. three-week basis, first dose  05/21/2013, last dose 09/03/2013   (6)  postmastectomy radiation therapy, completed 11/21/2013  (7) tamoxifen started December 2014   PLAN:  From a breast cancer standpoint, Eveleigh is doing well. She is tolerating the tamoxifen well, when she is able to afford being on it. My nurse is investigating other sources of funding for the patient. This fall we will consider switching her to an aromatase inhibitor.   The labs were reviewed in detail and were stable. She continues to demonstrate renal insufficiency, but has not visited her PCP to be worked up for this. I am having an urinalysis and culture collected to rule out UTI as a cause for her incontinence.   Jaeda will return in 6 months for labs and a follow up visit. She will continue with port flushes every 6 weeks as she can not afford to have it removed at this time. She understands and agrees with this plan. She knows the goal of treatment in her case is cure. She has been encouraged to call with any issues that might arise before her next visit here.   Marcelino Duster, NP   01/19/2015

## 2015-01-19 NOTE — Progress Notes (Signed)
Pt is still having a difficult time paying for her medication, particularly the Tamoxifen and Toprol. Pt has been getting a week's worth 7 days at a time b/c of cost. She pays $13.00 for 7 day's worth of Tamoxifen and the Toprol but sometimes goes without medication altogether b/c of affordability. Pt lives with her mother and her mother is the only one working in the house. I called our financial counselors to see if she qualified for any assistance, unfortunately she has exhausted that source. I also called [facility] pharmacy to see if medication was cheaper, it was the same price. Pt would also like to update her Advanced Directives, our SW, Vernie Shanks helped her with this before and pt would like to set up an appt with her. I called SW and left a VM and gave pt a business card to call for an appt.

## 2015-01-20 LAB — URINE CULTURE

## 2015-01-27 ENCOUNTER — Telehealth: Payer: Self-pay | Admitting: *Deleted

## 2015-01-27 NOTE — Telephone Encounter (Signed)
Patient called to ask if a letter regarding her disability has been completed.  Could not see any documentation in the chart regarding such a letter.  Please advise.

## 2015-01-27 NOTE — Telephone Encounter (Signed)
Val. Can you update [place] on this?

## 2015-01-28 NOTE — Telephone Encounter (Signed)
I'm working on it. She will be able to pick it up tomorrow up front.

## 2015-01-29 ENCOUNTER — Encounter: Payer: Self-pay | Admitting: Nurse Practitioner

## 2015-03-02 ENCOUNTER — Ambulatory Visit (HOSPITAL_BASED_OUTPATIENT_CLINIC_OR_DEPARTMENT_OTHER): Payer: Self-pay

## 2015-03-02 VITALS — BP 130/90 | HR 73 | Temp 98.5°F

## 2015-03-02 DIAGNOSIS — Z95828 Presence of other vascular implants and grafts: Secondary | ICD-10-CM

## 2015-03-02 DIAGNOSIS — C50411 Malignant neoplasm of upper-outer quadrant of right female breast: Secondary | ICD-10-CM

## 2015-03-02 DIAGNOSIS — Z452 Encounter for adjustment and management of vascular access device: Secondary | ICD-10-CM

## 2015-03-02 MED ORDER — HEPARIN SOD (PORK) LOCK FLUSH 100 UNIT/ML IV SOLN
500.0000 [IU] | Freq: Once | INTRAVENOUS | Status: AC
  Administered 2015-03-02: 500 [IU] via INTRAVENOUS
  Filled 2015-03-02: qty 5

## 2015-03-02 MED ORDER — SODIUM CHLORIDE 0.9 % IJ SOLN
10.0000 mL | INTRAMUSCULAR | Status: DC | PRN
  Administered 2015-03-02: 10 mL via INTRAVENOUS
  Filled 2015-03-02: qty 10

## 2015-03-02 NOTE — Patient Instructions (Signed)

## 2015-03-12 ENCOUNTER — Telehealth: Payer: Self-pay | Admitting: *Deleted

## 2015-03-12 ENCOUNTER — Other Ambulatory Visit: Payer: Self-pay | Admitting: *Deleted

## 2015-03-12 DIAGNOSIS — C50411 Malignant neoplasm of upper-outer quadrant of right female breast: Secondary | ICD-10-CM

## 2015-03-12 MED ORDER — TAMOXIFEN CITRATE 20 MG PO TABS: 20.0000 mg | ORAL_TABLET | Freq: Every day | ORAL | Status: DC

## 2015-03-12 NOTE — Telephone Encounter (Signed)
Received refill order for Tamoxifen from [facility] that that pt. Not taking d/t affordability as of February 2016. Call made to pt to confirm. No answer LVM.  At 1550 received call back from patient. Pt would like refill called in.

## 2015-03-12 NOTE — Telephone Encounter (Signed)
NOTIFIED PT. THAT HER DISABILITY LETTER WAS READY. PT. REQUESTED THE LETTER FAXED TO [place] #[phone]. CONFIRMATION THAT LETTER WAS RECEIVED. WILL CHECK WITH VAL DODD,RN CONCERNING FINANCIAL ASSISTANCE FOR MEDICATION.

## 2015-04-13 ENCOUNTER — Ambulatory Visit (HOSPITAL_BASED_OUTPATIENT_CLINIC_OR_DEPARTMENT_OTHER): Payer: Self-pay

## 2015-04-13 VITALS — BP 138/68 | HR 71 | Temp 98.9°F | Resp 18

## 2015-04-13 DIAGNOSIS — C50411 Malignant neoplasm of upper-outer quadrant of right female breast: Secondary | ICD-10-CM

## 2015-04-13 DIAGNOSIS — Z452 Encounter for adjustment and management of vascular access device: Secondary | ICD-10-CM

## 2015-04-13 DIAGNOSIS — Z95828 Presence of other vascular implants and grafts: Secondary | ICD-10-CM

## 2015-04-13 MED ORDER — HEPARIN SOD (PORK) LOCK FLUSH 100 UNIT/ML IV SOLN
500.0000 [IU] | Freq: Once | INTRAVENOUS | Status: AC
  Administered 2015-04-13: 500 [IU] via INTRAVENOUS
  Filled 2015-04-13: qty 5

## 2015-04-13 MED ORDER — SODIUM CHLORIDE 0.9 % IJ SOLN
10.0000 mL | INTRAMUSCULAR | Status: DC | PRN
  Administered 2015-04-13: 10 mL via INTRAVENOUS
  Filled 2015-04-13: qty 10

## 2015-04-13 NOTE — Patient Instructions (Signed)

## 2015-05-25 ENCOUNTER — Ambulatory Visit (HOSPITAL_BASED_OUTPATIENT_CLINIC_OR_DEPARTMENT_OTHER): Payer: Self-pay

## 2015-05-25 VITALS — BP 139/77 | HR 70 | Temp 98.2°F

## 2015-05-25 DIAGNOSIS — C50411 Malignant neoplasm of upper-outer quadrant of right female breast: Secondary | ICD-10-CM

## 2015-05-25 DIAGNOSIS — Z452 Encounter for adjustment and management of vascular access device: Secondary | ICD-10-CM

## 2015-05-25 DIAGNOSIS — Z95828 Presence of other vascular implants and grafts: Secondary | ICD-10-CM

## 2015-05-25 MED ORDER — SODIUM CHLORIDE 0.9 % IJ SOLN
10.0000 mL | INTRAMUSCULAR | Status: DC | PRN
  Administered 2015-05-25: 10 mL via INTRAVENOUS
  Filled 2015-05-25: qty 10

## 2015-05-25 MED ORDER — HEPARIN SOD (PORK) LOCK FLUSH 100 UNIT/ML IV SOLN
500.0000 [IU] | Freq: Once | INTRAVENOUS | Status: AC
  Administered 2015-05-25: 500 [IU] via INTRAVENOUS
  Filled 2015-05-25: qty 5

## 2015-05-25 NOTE — Progress Notes (Signed)
Pt in for flush today.  No blood return noted with flush.  PAC flushes without difficulty.  Pt states that there was no blood return on 5-14 when she was flushed last time.  Hinda Lenis, RN spoke with pt to reassure her that TPA can be used if blood draws are needed from [facility].  Pt verbalizes understanding.

## 2015-05-25 NOTE — Patient Instructions (Signed)

## 2015-07-20 ENCOUNTER — Ambulatory Visit (HOSPITAL_BASED_OUTPATIENT_CLINIC_OR_DEPARTMENT_OTHER): Payer: Self-pay | Admitting: Oncology

## 2015-07-20 ENCOUNTER — Telehealth: Payer: Self-pay | Admitting: Oncology

## 2015-07-20 ENCOUNTER — Other Ambulatory Visit (HOSPITAL_BASED_OUTPATIENT_CLINIC_OR_DEPARTMENT_OTHER): Payer: Self-pay

## 2015-07-20 ENCOUNTER — Ambulatory Visit (HOSPITAL_BASED_OUTPATIENT_CLINIC_OR_DEPARTMENT_OTHER): Payer: Self-pay

## 2015-07-20 VITALS — BP 147/86 | HR 90 | Temp 99.1°F | Resp 18 | Ht 62.0 in | Wt 226.3 lb

## 2015-07-20 DIAGNOSIS — Z170 Estrogen receptor positive status [ER+]: Secondary | ICD-10-CM

## 2015-07-20 DIAGNOSIS — C50911 Malignant neoplasm of unspecified site of right female breast: Secondary | ICD-10-CM

## 2015-07-20 DIAGNOSIS — C50411 Malignant neoplasm of upper-outer quadrant of right female breast: Secondary | ICD-10-CM

## 2015-07-20 DIAGNOSIS — Z95828 Presence of other vascular implants and grafts: Secondary | ICD-10-CM

## 2015-07-20 DIAGNOSIS — C773 Secondary and unspecified malignant neoplasm of axilla and upper limb lymph nodes: Secondary | ICD-10-CM

## 2015-07-20 DIAGNOSIS — Z79810 Long term (current) use of selective estrogen receptor modulators (SERMs): Secondary | ICD-10-CM

## 2015-07-20 LAB — COMPREHENSIVE METABOLIC PANEL (CC13)
ALBUMIN: 3.3 g/dL — AB (ref 3.5–5.0)
ALT: 14 U/L (ref 0–55)
AST: 10 U/L (ref 5–34)
Alkaline Phosphatase: 50 U/L (ref 40–150)
Anion Gap: 10 mEq/L (ref 3–11)
BUN: 23.5 mg/dL (ref 7.0–26.0)
CO2: 26 mEq/L (ref 22–29)
Calcium: 8.9 mg/dL (ref 8.4–10.4)
Chloride: 103 mEq/L (ref 98–109)
Creatinine: 1.3 mg/dL — ABNORMAL HIGH (ref 0.6–1.1)
EGFR: 49 mL/min/{1.73_m2} — ABNORMAL LOW (ref >90)
GLUCOSE: 154 mg/dL — AB (ref 70–140)
Potassium: 3.7 mEq/L (ref 3.5–5.1)
SODIUM: 140 meq/L (ref 136–145)
TOTAL PROTEIN: 6.5 g/dL (ref 6.4–8.3)

## 2015-07-20 LAB — CBC WITH DIFFERENTIAL/PLATELET
BASO%: 0.6 % (ref 0.0–2.0)
Basophils Absolute: 0.1 10*3/uL (ref 0.0–0.1)
EOS ABS: 0.5 10*3/uL (ref 0.0–0.5)
EOS%: 3.2 % (ref 0.0–7.0)
HCT: 33.5 % — ABNORMAL LOW (ref 34.8–46.6)
HEMOGLOBIN: 11.1 g/dL — AB (ref 11.6–15.9)
LYMPH%: 13.8 % — AB (ref 14.0–49.7)
MCH: 30.4 pg (ref 25.1–34.0)
MCHC: 33 g/dL (ref 31.5–36.0)
MCV: 92.2 fL (ref 79.5–101.0)
MONO#: 0.8 10*3/uL (ref 0.1–0.9)
MONO%: 5.1 % (ref 0.0–14.0)
NEUT%: 77.3 % — ABNORMAL HIGH (ref 38.4–76.8)
NEUTROS ABS: 11.9 10*3/uL — AB (ref 1.5–6.5)
Platelets: 284 10*3/uL (ref 145–400)
RBC: 3.63 10*6/uL — AB (ref 3.70–5.45)
RDW: 13.1 % (ref 11.2–14.5)
WBC: 15.5 10*3/uL — AB (ref 3.9–10.3)
lymph#: 2.1 10*3/uL (ref 0.9–3.3)

## 2015-07-20 MED ORDER — SODIUM CHLORIDE 0.9 % IJ SOLN
10.0000 mL | INTRAMUSCULAR | Status: DC | PRN
  Administered 2015-07-20: 10 mL via INTRAVENOUS
  Filled 2015-07-20: qty 10

## 2015-07-20 MED ORDER — TAMOXIFEN CITRATE 20 MG PO TABS: 20.0000 mg | ORAL_TABLET | Freq: Every day | ORAL | Status: DC

## 2015-07-20 MED ORDER — HEPARIN SOD (PORK) LOCK FLUSH 100 UNIT/ML IV SOLN
500.0000 [IU] | Freq: Once | INTRAVENOUS | Status: AC
  Administered 2015-07-20: 500 [IU] via INTRAVENOUS
  Filled 2015-07-20: qty 5

## 2015-07-20 NOTE — Telephone Encounter (Signed)
Gave avs & calendar for October, December, February

## 2015-07-20 NOTE — Patient Instructions (Signed)

## 2015-07-20 NOTE — Progress Notes (Signed)
Patient ID: [name] [name], female   DOB: 12-27-1960, 54 y.o.   MRN: [id number] ID: [name] [name] OB: [birth year]  MR#: [id number]  QBH#:[id number]  PCP: [name] [name], [name] [name] GYN:   SU: [name] [name], [name] [name] OTHER [name] [name]: [name] [name], [name] [name];  [name] [name], [name] [name];  [name] [name], [name] [name]; [name] [name], [name] [name]  CHIEF COMPLAINT:  Hx of Bilateral Breast Cancers  CURRENT TREATMENT: Tamoxifen  BREAST CANCER HISTORY: From the original intake note:  [name] [name] had not been able to have mammography for several years because of cost issues. More recently, she had noted a mass in her right breast but thought this might be MRSA. The urinary tract infection took her to her physician's office, and she brought the breast mass to the attention of [name] Pisciotta NP. She set up the patient for bilateral diagnostic mammography and right breast ultrasound at the [facility] 03/27/2013, which showed a 4 cm irregular mass in the upper outer right breast, with a 9 mm cluster of calcifications in the posterior lower right breast and a few mildly enlarged level I right axillary lymph nodes. The breast mass was palpable and ultrasound showed it to measure 4.1 cm, with some of the level I right axillary lymph nodes noted to have lost their fatty hilum.  Biopsy of both the suspicious areas in the right breast as well as one of the suspicious lymph nodes was performed 03/27/2013 and showed ([id number]) the larger breast mass to consist of an invasive ductal carcinoma with lobular features, grade 2, estrogen and progesterone receptor both 100% positive, with an MIB-1 of 15%, and no HER-2 amplification. 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 The sampled right axillary lymph node was also positive.  Bilateral breast MRI 04/01/2013 showed the larger right breast mass to measure 5.4 cm, with a satellite nodule immediately inferior to it measuring 8 mm. 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 There were multiple enlarged right axillary lymph nodes, the largest measuring 2.8 cm. In addition, in the left breast there were 2 foci on non-masslike enhancement consistent with DCIS measuring 1.3 cm and 2.2 cm. These weren't different quadrants.  The patient's subsequent history is as noted below  INTERVAL HISTORY: [name] [name] returns today for follow up of her bilateral breast cancers, accompanied by her mother the interval history is generally stable. She takes tamoxifen when she can afford it. She was being charge $15 for a week's supply of tamoxifen which is more than many patients a for 2 or 3 months of the drug. When she takes the medication she is not aware of any significant problems. Currently she is off the medication and she is having hot flashes which again do not increase when she takes tamoxifen.  The social situation remains poor. The patient's husband has been in and out of [facility] for the past few months with severe gastroparesis.  REVIEW OF SYSTEMS: [name] [name] remains disabled although for some reason she has not been able to obtain Medicaid. She is severely fatigued. She can't hear well. She has ringing in her ears. She has severe dental problems. She has circulation problems. She is short of breath with any amount of walking. She sleeps on 3 pillows. She keeps a dry cough. She has stress urinary incontinence. She has severe back and joint pain. She feels forgetful, anxious and depressed. A detailed review of systems today was otherwise  stable  PAST MEDICAL HISTORY: Past Medical History  Diagnosis Date  . Hypertension   . Anxiety   . Depression   . Headache(784.0)   . Sleep apnea     cpcp  . Shortness of breath   . Breast cancer 03/27/13    right  . Hx of radiation therapy 10/02/13- 11/21/13    right chest wall, right supraclav/axillary region 5040 cGy 28 sessions, right chest wall mastectomy scar boost 1000 cGy 5 sessions    PAST  SURGICAL HISTORY: Past Surgical History  Procedure Laterality Date  . Mastectomy, radical Right 04/08/2013  . Simple mastectomy with axillary sentinel node biopsy Left 04/08/2013  . Mastectomy modified radical Right 04/07/2013    Procedure: RIGHT MASTECTOMY MODIFIED RADICAL;  Surgeon: [name] [name], [name] [name];  Location: [facility];  Service: General;  Laterality: Right;  . Simple mastectomy with axillary sentinel node biopsy Left 04/07/2013    Procedure:  LEFT SMPLE MASTECTOMY WITH AXILLARY SENTINEL NODE BIOPSY;  Surgeon: [name] [name], [name] [name];  Location: [facility];  Service: General;  Laterality: Left;  . Portacath placement Left 04/07/2013    Procedure: INSERTION PORT-A-CATH;  Surgeon: [name] [name], [name] [name];  Location: [facility];  Service: General;  Laterality: Left;    FAMILY HISTORY Family History  Problem Relation Age of Onset  . Breast cancer Mother 75  . Breast cancer Maternal Grandmother 63  . Prostate cancer Paternal Grandfather 28   the patient's father died at age 75 in an airplane crash (he was a Engineer, maintenance (IT) unsupervised). The patient's mother is alive at age 20. Her name is [name] [name] and she works for [employer]. [name] [name] has a history of breast cancer diagnosed at age 45. She had one sister who is alive and well. [name] [name]'s mother, the patient's grandmother, was diagnosed with breast cancer at age 41. She had 2 sisters, neither with breast cancer. [name] [name] herself had one brother, no sisters. There is no other history of breast or ovary cancer in the family.  GYNECOLOGIC HISTORY:   (updated 02/03/2014)  Menarche age 60, she is GX P38, first live birth age 57. The patient was taking birth control pills until September 2013. She stopped having periods at that time, but had one in late March 2014.  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 She is now unemployed. Her husband [name] [name] (goes by "[name] [name]") is disabled secondary to diabetes. They were  separated for the past 2 years, but [name] [name] recently moved back in with [place]. [name] [name] lives with her mother [name] [name] and 57 poodles. The patient's daughter [name] [name] is 55 and also lives with the patient.    ADVANCED DIRECTIVES: In place. The patient has named her daughter [name] [name] as her healthcare power of attorney, bypassing her husband.  HEALTH MAINTENANCE:  (Updated 02/03/2014) Social History  Substance Use Topics  . Smoking status: Never Smoker   . Smokeless tobacco: Never Used  . Alcohol Use: No     Colonoscopy: Never  PAP: Possibly 2006, Not on file  Bone density: Possibly 2004, Not on file  Lipid panel: Not on file  Allergies  Allergen Reactions  . Vicodin [Hydrocodone-Acetaminophen] Anaphylaxis  . Codeine Nausea And Vomiting  . Penicillins Rash  . Percocet [Oxycodone-Acetaminophen] Itching  . Septra [Bactrim] Other (See Comments)    Heart races    Current Outpatient Prescriptions  Medication Sig Dispense Refill  . ALPRAZolam (XANAX) 0.5 MG tablet Take 0.5 mg by mouth  2 (two) times daily as needed for anxiety.     Marland Kitchen diltiazem 2 % GEL Apply 1 application topically 2 (two) times daily. 30 g 1  . escitalopram (LEXAPRO) 20 MG tablet Take 20 mg by mouth daily.    . meloxicam (MOBIC) 15 MG tablet Take 15 mg by mouth daily.    . metoprolol succinate (TOPROL-XL) 25 MG 24 hr tablet Take 25 mg by mouth daily.    . miconazole (MICOTIN) 2 % powder Apply topically as needed for itching.    . tamoxifen (NOLVADEX) 20 MG tablet Take 1 tablet (20 mg total) by mouth daily. 90 tablet 1  . triamterene-hydrochlorothiazide (MAXZIDE-25) 37.5-25 MG per tablet Take 1 tablet by mouth daily.     No current facility-administered medications for this visit.    OBJECTIVE:   Middle-aged white female who appears olderr than stated age  32 Vitals:   07/20/15 1133  BP: 147/86  Pulse: 90  Temp: 99.1 F (37.3 C)  Resp: 18     Body mass index is 41.38 kg/(m^2).    ECOG FS: 2 Filed  Weights   07/20/15 1133  Weight: 226 lb 4.8 oz (102.649 kg)    Sclerae unicteric, EOMs intact Oropharynx clear, dentition in poor repair No cervical or supraclavicular adenopathy Lungs no rales or rhonchi Heart regular rate and rhythm Abd soft, obese, nontender, positive bowel sounds MSK kyphosis but no focal spinal tenderness, no upper extremity lymphedema Neuro: nonfocal, well oriented, depressed affect Breasts: Status post bilateral mastectomies. There is no evidence of local recurrence. Both axillae are benign.     LAB RESULTS:  Lab Results  Component Value Date   WBC 15.5* 07/20/2015   NEUTROABS 11.9* 07/20/2015   HGB 11.1* 07/20/2015   HCT 33.5* 07/20/2015   MCV 92.2 07/20/2015   PLT 284 07/20/2015      Chemistry      Component Value Date/Time   NA 140 01/19/2015 1031   NA 138 10/08/2013 1246   K 3.8 01/19/2015 1031   K 3.2* 10/08/2013 1246   CL 100 10/08/2013 1246   CL 103 05/20/2013 1404   CO2 23 01/19/2015 1031   CO2 20 10/08/2013 1246   BUN 19.4 01/19/2015 1031   BUN 13 10/08/2013 1246   CREATININE 1.3* 01/19/2015 1031   CREATININE 1.50* 10/08/2013 1246      Component Value Date/Time   CALCIUM 9.4 01/19/2015 1031   CALCIUM 9.8 10/08/2013 1246   ALKPHOS 55 01/19/2015 1031   ALKPHOS 63 04/17/2013 0741   AST 13 01/19/2015 1031   AST 23 04/17/2013 0741   ALT 8 01/19/2015 1031   ALT 26 04/17/2013 0741   BILITOT <0.20 01/19/2015 1031   BILITOT 0.2* 04/17/2013 0741       STUDIES: No results found.  ASSESSMENT: 54 y.o. 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 There were also 2 suspicious areas in the left breast noted by MRI.  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 Patient's testing pending  (5)  completed adjuvant chemotherapy, consisting of 6 cycles of docetaxel/ doxorubicin/ cyclophosphamide given on a Q. three-week basis, first dose  05/21/2013, last dose 09/03/2013   (6)  postmastectomy radiation therapy, completed 11/21/2013  (7) tamoxifen started December 2014, continued intermittently due to financial problems   PLAN:  These is now 2 years out from her definitive surgery with no evidence of disease recurrence. Her risk of recurrence remain high since she had locally advanced disease. We have made every effort to enroll her in some program so she could obtain tamoxifen but have not been able to. She tells me she is planning to write a letter to her governor to see if she can get some help from that poor turgor.  A patient had given me and on open and bottle of anastrozole 1 mg and I gave that to her (after discussing it with pharmacy) so that she would have at least a month supply. I then wrote her to prescriptions for tamoxifen, one for 30 tablets and one for 60 tablets as suggested she take it to [facility], [facility], and [facility] and see if she can get a lower price at one of those than the one she is currently being quoted.  She is going to see [place] 6 months from now. We will repeat lab work that same day. She knows to call for any problems that may develop before that visit.     Chauncey Cruel, [name] [name]   07/20/2015

## 2015-08-23 ENCOUNTER — Telehealth: Payer: Self-pay | Admitting: *Deleted

## 2015-08-23 DIAGNOSIS — C50411 Malignant neoplasm of upper-outer quadrant of right female breast: Secondary | ICD-10-CM

## 2015-08-23 NOTE — Telephone Encounter (Signed)
Patient called to "notify Dr. Jana Hakim she would like to switch to Letrozole 2.5 mg  instead of Tamoxifen.  I feel so much better with the thirty day supply of samples.  I can get out of the house instead of feeling claustrophobic."  Uses [facility].  Has used the thirty day samples and currently out of medicine.  Please return call to [phone] when order sent to pharmacy or a decision.

## 2015-08-24 MED ORDER — LETROZOLE 2.5 MG PO TABS: 2.5000 mg | ORAL_TABLET | Freq: Every day | ORAL | Status: DC

## 2015-08-24 NOTE — Telephone Encounter (Signed)
This nurse sent order to Pharmacy.  Called Patient to notify her of the order for letrozole.  Thanked me for my help.

## 2015-08-24 NOTE — Telephone Encounter (Signed)
Please call letrozole 2.5 mg, 90 tabs, 3 refills  thanks

## 2015-08-25 ENCOUNTER — Telehealth: Payer: Self-pay

## 2015-08-25 DIAGNOSIS — C50411 Malignant neoplasm of upper-outer quadrant of right female breast: Secondary | ICD-10-CM

## 2015-08-25 NOTE — Telephone Encounter (Signed)
Returned call to [facility] re: letrozole and tamoxifen.  Per note dtd 08/23/15, let pharmacy know MD discontinuing tamoxifen.  Pharmacy to fill for letrozole only.  Pharmacy voiced understanding.    Epic Med list updated.

## 2015-08-27 ENCOUNTER — Encounter: Payer: Self-pay | Admitting: Genetic Counselor

## 2015-08-27 DIAGNOSIS — Z1379 Encounter for other screening for genetic and chromosomal anomalies: Secondary | ICD-10-CM | POA: Insufficient documentation

## 2015-09-14 ENCOUNTER — Ambulatory Visit (HOSPITAL_BASED_OUTPATIENT_CLINIC_OR_DEPARTMENT_OTHER): Payer: Self-pay

## 2015-09-14 VITALS — BP 127/59 | HR 74 | Temp 98.7°F | Resp 14

## 2015-09-14 DIAGNOSIS — Z452 Encounter for adjustment and management of vascular access device: Secondary | ICD-10-CM

## 2015-09-14 DIAGNOSIS — C50411 Malignant neoplasm of upper-outer quadrant of right female breast: Secondary | ICD-10-CM

## 2015-09-14 DIAGNOSIS — Z95828 Presence of other vascular implants and grafts: Secondary | ICD-10-CM

## 2015-09-14 MED ORDER — HEPARIN SOD (PORK) LOCK FLUSH 100 UNIT/ML IV SOLN
500.0000 [IU] | Freq: Once | INTRAVENOUS | Status: AC
  Administered 2015-09-14: 500 [IU] via INTRAVENOUS
  Filled 2015-09-14: qty 5

## 2015-09-14 MED ORDER — SODIUM CHLORIDE 0.9 % IJ SOLN
10.0000 mL | INTRAMUSCULAR | Status: DC | PRN
  Administered 2015-09-14: 10 mL via INTRAVENOUS
  Filled 2015-09-14: qty 10

## 2015-09-14 NOTE — Patient Instructions (Signed)

## 2015-11-09 ENCOUNTER — Ambulatory Visit (HOSPITAL_BASED_OUTPATIENT_CLINIC_OR_DEPARTMENT_OTHER): Payer: Self-pay

## 2015-11-09 VITALS — BP 148/84 | HR 77 | Temp 98.4°F | Resp 16

## 2015-11-09 DIAGNOSIS — Z452 Encounter for adjustment and management of vascular access device: Secondary | ICD-10-CM

## 2015-11-09 DIAGNOSIS — Z95828 Presence of other vascular implants and grafts: Secondary | ICD-10-CM

## 2015-11-09 DIAGNOSIS — C50411 Malignant neoplasm of upper-outer quadrant of right female breast: Secondary | ICD-10-CM

## 2015-11-09 MED ORDER — SODIUM CHLORIDE 0.9 % IJ SOLN
10.0000 mL | INTRAMUSCULAR | Status: DC | PRN
  Administered 2015-11-09: 10 mL via INTRAVENOUS
  Filled 2015-11-09: qty 10

## 2015-11-09 MED ORDER — HEPARIN SOD (PORK) LOCK FLUSH 100 UNIT/ML IV SOLN
500.0000 [IU] | Freq: Once | INTRAVENOUS | Status: AC
  Administered 2015-11-09: 500 [IU] via INTRAVENOUS
  Filled 2015-11-09: qty 5

## 2015-11-09 NOTE — Patient Instructions (Signed)

## 2016-01-17 ENCOUNTER — Other Ambulatory Visit: Payer: Self-pay | Admitting: *Deleted

## 2016-01-17 DIAGNOSIS — C50411 Malignant neoplasm of upper-outer quadrant of right female breast: Secondary | ICD-10-CM

## 2016-01-18 ENCOUNTER — Encounter: Payer: Self-pay | Admitting: Nurse Practitioner

## 2016-01-18 ENCOUNTER — Other Ambulatory Visit: Payer: Self-pay | Admitting: *Deleted

## 2016-01-18 ENCOUNTER — Other Ambulatory Visit (HOSPITAL_BASED_OUTPATIENT_CLINIC_OR_DEPARTMENT_OTHER): Payer: Self-pay

## 2016-01-18 ENCOUNTER — Telehealth: Payer: Self-pay | Admitting: Oncology

## 2016-01-18 ENCOUNTER — Ambulatory Visit (HOSPITAL_BASED_OUTPATIENT_CLINIC_OR_DEPARTMENT_OTHER): Payer: Self-pay | Admitting: Nurse Practitioner

## 2016-01-18 ENCOUNTER — Ambulatory Visit: Payer: Self-pay

## 2016-01-18 VITALS — BP 145/91 | HR 74 | Temp 98.2°F | Resp 18 | Ht 62.0 in | Wt 223.0 lb

## 2016-01-18 DIAGNOSIS — C50411 Malignant neoplasm of upper-outer quadrant of right female breast: Secondary | ICD-10-CM

## 2016-01-18 DIAGNOSIS — F418 Other specified anxiety disorders: Secondary | ICD-10-CM

## 2016-01-18 DIAGNOSIS — C773 Secondary and unspecified malignant neoplasm of axilla and upper limb lymph nodes: Secondary | ICD-10-CM

## 2016-01-18 DIAGNOSIS — Z95828 Presence of other vascular implants and grafts: Secondary | ICD-10-CM

## 2016-01-18 DIAGNOSIS — M2550 Pain in unspecified joint: Secondary | ICD-10-CM

## 2016-01-18 DIAGNOSIS — M545 Low back pain: Secondary | ICD-10-CM

## 2016-01-18 DIAGNOSIS — A6000 Herpesviral infection of urogenital system, unspecified: Secondary | ICD-10-CM

## 2016-01-18 DIAGNOSIS — R911 Solitary pulmonary nodule: Secondary | ICD-10-CM

## 2016-01-18 DIAGNOSIS — G8929 Other chronic pain: Secondary | ICD-10-CM

## 2016-01-18 DIAGNOSIS — N951 Menopausal and female climacteric states: Secondary | ICD-10-CM

## 2016-01-18 DIAGNOSIS — R0609 Other forms of dyspnea: Secondary | ICD-10-CM

## 2016-01-18 LAB — COMPREHENSIVE METABOLIC PANEL
ALT: 12 U/L (ref 0–55)
AST: 14 U/L (ref 5–34)
Albumin: 3.5 g/dL (ref 3.5–5.0)
Alkaline Phosphatase: 58 U/L (ref 40–150)
Anion Gap: 10 mEq/L (ref 3–11)
BILIRUBIN TOTAL: 0.34 mg/dL (ref 0.20–1.20)
BUN: 22.6 mg/dL (ref 7.0–26.0)
CHLORIDE: 103 meq/L (ref 98–109)
CO2: 25 meq/L (ref 22–29)
Calcium: 9.7 mg/dL (ref 8.4–10.4)
Creatinine: 1.3 mg/dL — ABNORMAL HIGH (ref 0.6–1.1)
EGFR: 48 mL/min/{1.73_m2} — AB (ref >90)
GLUCOSE: 133 mg/dL (ref 70–140)
Potassium: 4 mEq/L (ref 3.5–5.1)
SODIUM: 138 meq/L (ref 136–145)
TOTAL PROTEIN: 7.1 g/dL (ref 6.4–8.3)

## 2016-01-18 LAB — CBC WITH DIFFERENTIAL/PLATELET
BASO%: 0.9 % (ref 0.0–2.0)
Basophils Absolute: 0.1 10*3/uL (ref 0.0–0.1)
EOS ABS: 0.6 10*3/uL — AB (ref 0.0–0.5)
EOS%: 5.3 % (ref 0.0–7.0)
HCT: 36.5 % (ref 34.8–46.6)
HGB: 12.3 g/dL (ref 11.6–15.9)
LYMPH%: 17.8 % (ref 14.0–49.7)
MCH: 30.8 pg (ref 25.1–34.0)
MCHC: 33.6 g/dL (ref 31.5–36.0)
MCV: 91.6 fL (ref 79.5–101.0)
MONO#: 0.5 10*3/uL (ref 0.1–0.9)
MONO%: 4.6 % (ref 0.0–14.0)
NEUT%: 71.4 % (ref 38.4–76.8)
NEUTROS ABS: 7.7 10*3/uL — AB (ref 1.5–6.5)
Platelets: 278 10*3/uL (ref 145–400)
RBC: 3.99 10*6/uL (ref 3.70–5.45)
RDW: 13.9 % (ref 11.2–14.5)
WBC: 10.7 10*3/uL — AB (ref 3.9–10.3)
lymph#: 1.9 10*3/uL (ref 0.9–3.3)

## 2016-01-18 MED ORDER — LIDOCAINE-PRILOCAINE 2.5-2.5 % EX CREA: 1.0000 "application " | TOPICAL_CREAM | CUTANEOUS | Status: DC | PRN

## 2016-01-18 MED ORDER — HEPARIN SOD (PORK) LOCK FLUSH 100 UNIT/ML IV SOLN
500.0000 [IU] | Freq: Once | INTRAVENOUS | Status: AC
  Administered 2016-01-18: 500 [IU] via INTRAVENOUS
  Filled 2016-01-18: qty 5

## 2016-01-18 MED ORDER — LETROZOLE 2.5 MG PO TABS: 2.5000 mg | ORAL_TABLET | Freq: Every day | ORAL | Status: DC

## 2016-01-18 MED ORDER — SODIUM CHLORIDE 0.9% FLUSH
10.0000 mL | INTRAVENOUS | Status: DC | PRN
  Administered 2016-01-18: 10 mL via INTRAVENOUS
  Filled 2016-01-18: qty 10

## 2016-01-18 MED ORDER — ACYCLOVIR 400 MG PO TABS: 400.0000 mg | ORAL_TABLET | Freq: Two times a day (BID) | ORAL | Status: AC

## 2016-01-18 NOTE — Telephone Encounter (Signed)
appt made and avs printed °

## 2016-01-18 NOTE — Progress Notes (Signed)
Patient ID: [name] [name], female   DOB: 02/28/1961, 55 y.o.   MRN: [id number] ID: [name] OB: Oct 10, 1961  MR#: [id number]  LHT#:[id number]  PCP: Morton Peters, MD GYN:   SU: Alphonsa Overall, MD OTHER MD: Arloa Koh, MD;  Cleone Slim, MD;  Alphonzo Severance, MD; Crissie Reese, MD  CHIEF COMPLAINT:  Hx of Bilateral Breast Cancers  CURRENT TREATMENT: Tamoxifen  BREAST CANCER HISTORY: From the original intake note:  [name] [name] had not been able to have mammography for several years because of cost issues. More recently, she had noted a mass in her right breast but thought this might be MRSA. The urinary tract infection took her to her physician's office, and she brought the breast mass to the attention of [name]. She set up the patient for bilateral diagnostic mammography and right breast ultrasound at the [facility] 03/27/2013, which showed a 4 cm irregular mass in the upper outer right breast, with a 9 mm cluster of calcifications in the posterior lower right breast and a few mildly enlarged level I right axillary lymph nodes. The breast mass was palpable and ultrasound showed it to measure 4.1 cm, with some of the level I right axillary lymph nodes noted to have lost their fatty hilum.  Biopsy of both the suspicious areas in the right breast as well as one of the suspicious lymph nodes was performed 03/27/2013 and showed ([id number]) the larger breast mass to consist of an invasive ductal carcinoma with lobular features, grade 2, estrogen and progesterone receptor both 100% positive, with an MIB-1 of 15%, and no HER-2 amplification. 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 The sampled right axillary lymph node was also positive.  Bilateral breast MRI 04/01/2013 showed the larger right breast mass to measure 5.4 cm, with a satellite nodule immediately inferior to it measuring 8 mm. 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 There were multiple enlarged right axillary lymph nodes, the largest measuring 2.8 cm. In addition, in the left breast there were 2 foci on non-masslike enhancement consistent with DCIS measuring 1.3 cm and 2.2 cm. These weren't different quadrants.  The patient's subsequent history is as noted below  INTERVAL HISTORY: [name] [name] returns today for follow up of her bilateral breast cancers, accompanied by her daughter. Last fall the tamoxifen was stopped, and she was switched to letrozole in September. She is able to afford this much easier, and she found out that the side effect profile for this drug is actually a better fit for her than the tamoxifen. She feels like her mood has improved. She continues to have hot flashes but this was a long standing issue before antiestrogen therapy. Her joint pain has not worsened.   REVIEW OF SYSTEMS: [name] [name] denies fevers or chills. She has some nausea first thing in the morning, but it quickly dissipates. She can range from constipation to diarrhea depending on what she eats. Her appetite is good. She is severely fatigued and disabled. She is not able to exercise. She has chronic back and joint pain. She is short of breath with minimal exertion. She denies chest pain, cough, or palpitations. She has occasional headaches and dizziness. Her ears ring and her hearing has decreased. She endorses anxiety and depression, but she is stable on xanax BID and lexapro daily. She denies homicidal or suicidal ideations. A detailed review of systems  is otherwise stable.  PAST MEDICAL HISTORY: Past Medical History  Diagnosis Date  . Hypertension   . Anxiety   . Depression   . Headache(784.0)   . Sleep apnea     cpcp  . Shortness of breath   . Breast cancer 03/27/13    right  . Hx of radiation therapy 10/02/13- 11/21/13    right chest wall, right supraclav/axillary region 5040 cGy 28 sessions, right chest wall mastectomy scar  boost 1000 cGy 5 sessions    PAST SURGICAL HISTORY: Past Surgical History  Procedure Laterality Date  . Mastectomy, radical Right 04/08/2013  . Simple mastectomy with axillary sentinel node biopsy Left 04/08/2013  . Mastectomy modified radical Right 04/07/2013    Procedure: RIGHT MASTECTOMY MODIFIED RADICAL;  Surgeon: Shann Medal, MD;  Location: [place];  Service: General;  Laterality: Right;  . Simple mastectomy with axillary sentinel node biopsy Left 04/07/2013    Procedure:  LEFT SMPLE MASTECTOMY WITH AXILLARY SENTINEL NODE BIOPSY;  Surgeon: Shann Medal, MD;  Location: [place];  Service: General;  Laterality: Left;  . Portacath placement Left 04/07/2013    Procedure: INSERTION PORT-A-CATH;  Surgeon: Shann Medal, MD;  Location: [place];  Service: General;  Laterality: Left;    FAMILY HISTORY Family History  Problem Relation Age of Onset  . Breast cancer Mother 25  . Breast cancer Maternal Grandmother 26  . Prostate cancer Paternal Grandfather 68   the patient's father died at age 53 in an airplane crash (he was a Engineer, maintenance (IT) unsupervised). The patient's mother is alive at age 12. Her name is [name] and she works for [employer]. [name] [name] has a history of breast cancer diagnosed at age 15. She had one sister who is alive and well. [name] [name]'s mother, the patient's grandmother, was diagnosed with breast cancer at age 28. She had 2 sisters, neither with breast cancer. Sailor herself had one brother, no sisters. There is no other history of breast or ovary cancer in the family.  GYNECOLOGIC HISTORY:   (updated 02/03/2014)  Menarche age 75, she is GX P48, first live birth age 46. The patient was taking birth control pills until September 2013. She stopped having periods at that time, but had one in late March 2014.  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 She is now unemployed. Her husband Jamilett Ferrante (goes by "[name]") is disabled  secondary to diabetes. They were separated for the past 2 years, but [name] recently moved back in with [place]. Viveka lives with her mother [name] [name] and 42 poodles. The patient's daughter [name] [name] is 79 and also lives with the patient.    ADVANCED DIRECTIVES: In place. The patient has named her daughter [name] [name] as her healthcare power of attorney, bypassing her husband.  HEALTH MAINTENANCE:  (Updated 02/03/2014) Social History  Substance Use Topics  . Smoking status: Never Smoker   . Smokeless tobacco: Never Used  . Alcohol Use: No     Colonoscopy: Never  PAP: Possibly 2006, Not on file  Bone density: Possibly 2004, Not on file  Lipid panel: Not on file  Allergies  Allergen Reactions  . Vicodin [Hydrocodone-Acetaminophen] Anaphylaxis  . Codeine Nausea And Vomiting  . Penicillins Rash  . Percocet [Oxycodone-Acetaminophen] Itching  . Septra [Bactrim] Other (See Comments)    Heart races    Current Outpatient Prescriptions  Medication Sig Dispense Refill  . ALPRAZolam (XANAX) 0.5 MG tablet Take 0.5 mg  by mouth 2 (two) times daily as needed for anxiety.     Marland Kitchen aspirin 81 MG tablet Take 81 mg by mouth daily.    Marland Kitchen diltiazem 2 % GEL Apply 1 application topically 2 (two) times daily. (Patient taking differently: Apply 1 application topically as needed. ) 30 g 1  . escitalopram (LEXAPRO) 20 MG tablet Take 20 mg by mouth daily.    . meloxicam (MOBIC) 15 MG tablet Take 15 mg by mouth daily.    . metoprolol succinate (TOPROL-XL) 25 MG 24 hr tablet Take 25 mg by mouth daily.    Marland Kitchen triamterene-hydrochlorothiazide (MAXZIDE-25) 37.5-25 MG per tablet Take 1 tablet by mouth daily.    Marland Kitchen acyclovir (ZOVIRAX) 400 MG tablet Take 1 tablet (400 mg total) by mouth 2 (two) times daily. 60 tablet 5  . letrozole (FEMARA) 2.5 MG tablet Take 1 tablet (2.5 mg total) by mouth daily. 90 tablet 3  . lidocaine-prilocaine (EMLA) cream Apply 1 application topically as needed. 30 g 1  . miconazole (MICOTIN) 2  % powder Apply topically as needed for itching. Reported on 01/18/2016     No current facility-administered medications for this visit.    OBJECTIVE:   Middle-aged white female who appears olderr than stated age  19 Vitals:   01/18/16 1110  BP: 145/91  Pulse: 74  Temp: 98.2 F (36.8 C)  Resp: 18     Body mass index is 40.78 kg/(m^2).    ECOG FS: 2 Filed Weights   01/18/16 1110  Weight: 223 lb (101.152 kg)   Skin: warm, dry  HEENT: sclerae anicteric, conjunctivae pink, oropharynx clear. No thrush or mucositis.  Lymph Nodes: No cervical or supraclavicular lymphadenopathy  Lungs: clear to auscultation bilaterally, no rales, wheezes, or rhonci  Heart: regular rate and rhythm  Abdomen: round, soft, non tender, positive bowel sounds  Musculoskeletal: No focal spinal tenderness, no peripheral edema  Neuro: non focal, well oriented, positive affect  Breasts: bilateral breasts status post mastectomies. No evidence of recurrent disease. Bilateral axillae benign.  LAB RESULTS:  Lab Results  Component Value Date   WBC 10.7* 01/18/2016   NEUTROABS 7.7* 01/18/2016   HGB 12.3 01/18/2016   HCT 36.5 01/18/2016   MCV 91.6 01/18/2016   PLT 278 01/18/2016      Chemistry      Component Value Date/Time   NA 138 01/18/2016 1048   NA 138 10/08/2013 1246   K 4.0 01/18/2016 1048   K 3.2* 10/08/2013 1246   CL 100 10/08/2013 1246   CL 103 05/20/2013 1404   CO2 25 01/18/2016 1048   CO2 20 10/08/2013 1246   BUN 22.6 01/18/2016 1048   BUN 13 10/08/2013 1246   CREATININE 1.3* 01/18/2016 1048   CREATININE 1.50* 10/08/2013 1246      Component Value Date/Time   CALCIUM 9.7 01/18/2016 1048   CALCIUM 9.8 10/08/2013 1246   ALKPHOS 58 01/18/2016 1048   ALKPHOS 63 04/17/2013 0741   AST 14 01/18/2016 1048   AST 23 04/17/2013 0741   ALT 12 01/18/2016 1048   ALT 26 04/17/2013 0741   BILITOT 0.34 01/18/2016 1048   BILITOT 0.2* 04/17/2013 0741       STUDIES: No results  found.  ASSESSMENT: 55 y.o. 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 There were also 2 suspicious areas in the left breast noted by MRI.  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 Patient's testing pending  (5)  completed adjuvant chemotherapy, consisting of 6 cycles of docetaxel/ doxorubicin/ cyclophosphamide given on a Q. three-week basis, first dose  05/21/2013, last dose 09/03/2013   (6)  postmastectomy radiation therapy, completed 11/21/2013  (7) tamoxifen started December 2014, continued intermittently due to financial problems discontinued August 2016.   (8) letrozole started late September 2016   PLAN:  [name] [name] has improved mentally since our last visit. Her spirits are up and she is glad she made the switch to letrozole. We will continue this daily to continue her 5-10 year term on antiestrogen therapy. She is now almost 3 years out from her bilateral mastectomies with no evidence of recurrent disease. She is at high risk for recurrence due to her locally advanced disease. She is going to obtain a chest xray at the conclusion of her visit today. This will also provide surveillance of an old pulmonary  nodule.  For her genital herpes she would like to go on viral suppression therapy. We will try 44m acyclovir BID. The valacyclovir would be too expensive.   LMychellewill return in 6 months for labs and follow up with Dr. MJana Hakim She will continue to have her port flushed every 6-8 weeks. She understands and agrees with this plan. She knows the goal of treatment in her case is cure. She has been encouraged to call with any issues that might arise before her next visit here.   HLaurie Panda NP   01/18/2016

## 2016-01-18 NOTE — Patient Instructions (Signed)

## 2016-03-09 ENCOUNTER — Other Ambulatory Visit: Payer: Self-pay | Admitting: *Deleted

## 2016-03-09 ENCOUNTER — Telehealth: Payer: Self-pay | Admitting: *Deleted

## 2016-03-09 DIAGNOSIS — N926 Irregular menstruation, unspecified: Secondary | ICD-10-CM

## 2016-03-09 DIAGNOSIS — C50411 Malignant neoplasm of upper-outer quadrant of right female breast: Secondary | ICD-10-CM

## 2016-03-09 NOTE — Telephone Encounter (Signed)
This RN spoke with pt's daughter per her call stating " mom has started having a period and she hasn't had one for years " - " I need you to call me back ASAP because I am scared " Pt is on letrozole per history of breast cancer.  Per inquiry- flow of vaginal bleeding is " normal for her period "- pt is " not gushing or having large clots " .  [name] was present with mother and corresponding as well.  Pt states she has not had a gyn exam " over 13 years ago '.  This RN informed [name]- above may be normal for pt- not unusual for a women of her age to have a stop in menses post breast cancer diagnosis and therapy - and then have it return.  Per review with MD plan is for pt to have a gyn work up asap for further evaluation and recommendation per need for appropriate anti estrogen.  Note pt has medicaid per insurance.

## 2016-03-09 NOTE — Telephone Encounter (Signed)
This RN contacted 3 GYN offices to obtain an appointment for this patient with medicaid.  Obtained an appointment for routine follow up per GYN exam with Dr Baltazar Najjar at [facility] for 5/11 at 230 pm.  This RN spoke with pt's daughter, [name], all the above information given as well as phone number and address.  Per MD recommendation pt will hold letrozole until post gyn work up.  Appointment will be made for MD follow up.

## 2016-04-06 ENCOUNTER — Other Ambulatory Visit: Payer: Self-pay | Admitting: Nurse Practitioner

## 2016-04-06 ENCOUNTER — Ambulatory Visit (INDEPENDENT_AMBULATORY_CARE_PROVIDER_SITE_OTHER): Payer: Medicaid Other | Admitting: Obstetrics

## 2016-04-06 ENCOUNTER — Encounter: Payer: Self-pay | Admitting: Obstetrics

## 2016-04-06 VITALS — BP 138/93 | HR 73 | Ht 62.0 in | Wt 229.0 lb

## 2016-04-06 DIAGNOSIS — Z01419 Encounter for gynecological examination (general) (routine) without abnormal findings: Secondary | ICD-10-CM

## 2016-04-06 DIAGNOSIS — Z3049 Encounter for surveillance of other contraceptives: Secondary | ICD-10-CM | POA: Diagnosis not present

## 2016-04-06 NOTE — Progress Notes (Signed)
Subjective:        [name] [name] is a 55 y.o. female here for a routine exam.  Current complaints: Occasional vaginal spotting since being taken off Tamoxifen and started on Letrozole.  No period for 4 years prior to breast CA diagnosis and treatment.  Denies pelvic pain.  Personal health questionnaire:  Is patient Ashkenazi Jewish, have a family history of breast and/or ovarian cancer: no Is there a family history of uterine cancer diagnosed at age < 75, gastrointestinal cancer, urinary tract cancer, family member who is a Field seismologist syndrome-associated carrier: no Is the patient overweight and hypertensive, family history of diabetes, personal history of gestational diabetes, preeclampsia or PCOS: yes Is patient over 77, have PCOS,  family history of premature CHD under age 36, diabetes, smoke, have hypertension or peripheral artery disease:  no At any time, has a partner hit, kicked or otherwise hurt or frightened you?: no Over the past 2 weeks, have you felt down, depressed or hopeless?: no Over the past 2 weeks, have you felt little interest or pleasure in doing things?:no   Gynecologic History Patient's last menstrual period was 03/07/2016. Contraception: post menopausal status Last Pap: 13 years ago. Results were: normal Last mammogram: 2014. Results were: abnormal  Obstetric History OB History  No data available    Past Medical History  Diagnosis Date  . Hypertension   . Anxiety   . Depression   . Headache(784.0)   . Sleep apnea     cpcp  . Shortness of breath   . Breast cancer ([place]) 03/27/13    right  . Hx of radiation therapy 10/02/13- 11/21/13    right chest wall, right supraclav/axillary region 5040 cGy 28 sessions, right chest wall mastectomy scar boost 1000 cGy 5 sessions    Past Surgical History  Procedure Laterality Date  . Mastectomy, radical Right 04/08/2013  . Simple mastectomy with axillary sentinel node biopsy Left 04/08/2013  . Mastectomy modified radical  Right 04/07/2013    Procedure: RIGHT MASTECTOMY MODIFIED RADICAL;  Surgeon: Shann Medal, MD;  Location: [place] [facility];  Service: General;  Laterality: Right;  . Simple mastectomy with axillary sentinel node biopsy Left 04/07/2013    Procedure:  LEFT SMPLE MASTECTOMY WITH AXILLARY SENTINEL NODE BIOPSY;  Surgeon: Shann Medal, MD;  Location: [place];  Service: General;  Laterality: Left;  . Portacath placement Left 04/07/2013    Procedure: INSERTION PORT-A-CATH;  Surgeon: Shann Medal, MD;  Location: [place];  Service: General;  Laterality: Left;     Current outpatient prescriptions:  .  ALPRAZolam (XANAX) 0.5 MG tablet, Take 0.5 mg by mouth 2 (two) times daily as needed for anxiety. , Disp: , Rfl:  .  aspirin 81 MG tablet, Take 81 mg by mouth daily., Disp: , Rfl:  .  escitalopram (LEXAPRO) 20 MG tablet, Take 20 mg by mouth daily., Disp: , Rfl:  .  letrozole (FEMARA) 2.5 MG tablet, Take 1 tablet (2.5 mg total) by mouth daily., Disp: 90 tablet, Rfl: 3 .  lidocaine-prilocaine (EMLA) cream, Apply 1 application topically as needed., Disp: 30 g, Rfl: 1 .  meloxicam (MOBIC) 15 MG tablet, Take 15 mg by mouth daily., Disp: , Rfl:  .  metoprolol succinate (TOPROL-XL) 25 MG 24 hr tablet, Take 25 mg by mouth daily., Disp: , Rfl:  .  triamterene-hydrochlorothiazide (MAXZIDE-25) 37.5-25 MG per tablet, Take 1 tablet by mouth daily., Disp: , Rfl:  .  acyclovir (ZOVIRAX) 400 MG tablet, Take 1 tablet (  400 mg total) by mouth 2 (two) times daily. (Patient not taking: Reported on 04/06/2016), Disp: 60 tablet, Rfl: 5 .  diltiazem 2 % GEL, Apply 1 application topically 2 (two) times daily. (Patient taking differently: Apply 1 application topically as needed. ), Disp: 30 g, Rfl: 1 .  miconazole (MICOTIN) 2 % powder, Apply topically as needed for itching. Reported on 01/18/2016, Disp: , Rfl:  Allergies  Allergen Reactions  . Vicodin [Hydrocodone-Acetaminophen] Anaphylaxis  . Codeine Nausea And Vomiting  . Penicillins Rash  .  Percocet [Oxycodone-Acetaminophen] Itching  . Septra [Bactrim] Other (See Comments)    Heart races    Social History  Substance Use Topics  . Smoking status: Never Smoker   . Smokeless tobacco: Never Used  . Alcohol Use: No    Family History  Problem Relation Age of Onset  . Breast cancer Mother 45  . Breast cancer Maternal Grandmother 18  . Prostate cancer Paternal Grandfather 68      Review of Systems  Constitutional: negative for fatigue and weight loss Respiratory: negative for cough and wheezing Cardiovascular: negative for chest pain, fatigue and palpitations Gastrointestinal: negative for abdominal pain and change in bowel habits Musculoskeletal:negative for myalgias Neurological: negative for gait problems and tremors Behavioral/Psych: negative for abusive relationship, depression Endocrine: negative for temperature intolerance   Genitourinary:negative for abnormal menstrual periods, genital lesions, hot flashes, sexual problems and vaginal discharge Integument/breast: negative for breast lump, breast tenderness, nipple discharge and skin lesion(s)    Objective:       BP 138/93 mmHg  Pulse 73  Ht 5\' 2"  (1.575 m)  Wt 229 lb (103.874 kg)  BMI 41.87 kg/m2  LMP 03/07/2016 General:   alert  Skin:   no rash or abnormalities  Lungs:   clear to auscultation bilaterally  Heart:   regular rate and rhythm, S1, S2 normal, no murmur, click, rub or gallop  Breasts:   surgical scars from bilateral mastectomy.  No palpable masses.  Abdomen:  normal findings: no organomegaly, soft, non-tender and no hernia  Pelvis:  External genitalia: normal general appearance Urinary system: urethral meatus normal and bladder without fullness, nontender Vaginal: normal without tenderness, induration or masses Cervix: normal appearance Adnexa: normal bimanual exam Uterus: anteverted and non-tender, normal size   Lab Review Urine pregnancy test Labs reviewed yes Radiologic studies  reviewed yes    Assessment:    Postmenopausal bleeding.    S/P Bilateral Mastectomy, Radiation and Chemotherapy.  Doing well.     Plan:    Patient will need pelvic ultrasound, endometrial biopsy and colonoscopy which she says she cannot afford because of lack of insurance or other financial resources.  Discussed with patient the possibility of continuing care at the [facility] where the costs may be prorated based on her income which is nothing, because she has no job.  Education reviewed: Stressed the importance of some how finding the resources for furthe testing and care recommended.  Will discussed with her PCP.Marland Kitchen   No orders of the defined types were placed in this encounter.   No orders of the defined types were placed in this encounter.

## 2016-04-07 ENCOUNTER — Telehealth: Payer: Self-pay

## 2016-04-07 NOTE — Telephone Encounter (Signed)
Dr. Jodi Mourning wanted to be sure you saw his notes on this patient, 04/06/16 - Thank you

## 2016-04-10 ENCOUNTER — Other Ambulatory Visit: Payer: Self-pay

## 2016-04-10 DIAGNOSIS — Z95828 Presence of other vascular implants and grafts: Secondary | ICD-10-CM | POA: Insufficient documentation

## 2016-04-10 LAB — PAP IG AND HPV HIGH-RISK
HPV, HIGH-RISK: NEGATIVE
PAP Smear Comment: 0

## 2016-04-11 ENCOUNTER — Ambulatory Visit (HOSPITAL_BASED_OUTPATIENT_CLINIC_OR_DEPARTMENT_OTHER): Payer: Self-pay

## 2016-04-11 DIAGNOSIS — Z95828 Presence of other vascular implants and grafts: Secondary | ICD-10-CM

## 2016-04-11 DIAGNOSIS — Z452 Encounter for adjustment and management of vascular access device: Secondary | ICD-10-CM

## 2016-04-11 DIAGNOSIS — C50411 Malignant neoplasm of upper-outer quadrant of right female breast: Secondary | ICD-10-CM

## 2016-04-11 LAB — NUSWAB VG, CANDIDA 6SP
CANDIDA GLABRATA, NAA: NEGATIVE
CANDIDA KRUSEI, NAA: NEGATIVE
CANDIDA LUSITANIAE, NAA: NEGATIVE
CANDIDA TROPICALIS, NAA: NEGATIVE
Candida albicans, NAA: NEGATIVE
Candida parapsilosis, NAA: NEGATIVE
Trich vag by NAA: NEGATIVE

## 2016-04-11 MED ORDER — HEPARIN SOD (PORK) LOCK FLUSH 100 UNIT/ML IV SOLN
500.0000 [IU] | Freq: Once | INTRAVENOUS | Status: AC | PRN
  Administered 2016-04-11: 500 [IU] via INTRAVENOUS
  Filled 2016-04-11: qty 5

## 2016-04-11 MED ORDER — SODIUM CHLORIDE 0.9 % IJ SOLN
10.0000 mL | INTRAMUSCULAR | Status: DC | PRN
  Administered 2016-04-11: 10 mL via INTRAVENOUS
  Filled 2016-04-11: qty 10

## 2016-04-11 NOTE — Patient Instructions (Signed)

## 2016-04-18 ENCOUNTER — Ambulatory Visit (INDEPENDENT_AMBULATORY_CARE_PROVIDER_SITE_OTHER): Payer: Self-pay | Admitting: Obstetrics

## 2016-04-18 ENCOUNTER — Other Ambulatory Visit: Payer: Self-pay | Admitting: Obstetrics

## 2016-04-18 VITALS — BP 148/104 | HR 78 | Wt 222.0 lb

## 2016-04-18 DIAGNOSIS — R87611 Atypical squamous cells cannot exclude high grade squamous intraepithelial lesion on cytologic smear of cervix (ASC-H): Secondary | ICD-10-CM

## 2016-04-18 NOTE — Progress Notes (Signed)
Colposcopy Procedure Note  Indications: Pap smear 0.5 months ago showed: ASC cannot exclude high grade lesion [facility]). The prior pap showed unknown.  Prior cervical/vaginal disease: none known. Prior cervical treatment: no treatment.  Procedure Details  The risks and benefits of the procedure and Written informed consent obtained.  A time-out was performed confirming the patient, procedure and allergy status  Speculum placed in vagina and excellent visualization of cervix achieved, cervix swabbed x 3 with acetic acid solution.  Findings: Cervix: abnormal vessels noted at 11 o'clock; SCJ visualized 360 degrees without lesions, endocervical curettage performed, cervical biopsies taken at 11 and 6 o'clock, specimen labelled and sent to pathology and hemostasis achieved with Monsel's solution.   Vaginal inspection: normal without visible lesions. Vulvar colposcopy: vulvar colposcopy not performed.   Physical Exam   Specimens: ECC and Cervical Biopsies  Complications: pain, bleeding.  Plan: Specimens labelled and sent to Pathology. Will base further treatment on Pathology findings. Post biopsy instructions given to patient. Return to discuss Pathology results in 2 weeks.

## 2016-04-19 ENCOUNTER — Encounter: Payer: Self-pay | Admitting: Obstetrics

## 2016-04-28 ENCOUNTER — Ambulatory Visit (INDEPENDENT_AMBULATORY_CARE_PROVIDER_SITE_OTHER): Payer: Self-pay | Admitting: Obstetrics

## 2016-04-28 ENCOUNTER — Encounter: Payer: Self-pay | Admitting: Obstetrics

## 2016-04-28 VITALS — BP 162/86 | HR 77 | Temp 98.4°F | Wt 222.0 lb

## 2016-04-28 DIAGNOSIS — R87611 Atypical squamous cells cannot exclude high grade squamous intraepithelial lesion on cytologic smear of cervix (ASC-H): Secondary | ICD-10-CM

## 2016-04-28 DIAGNOSIS — C50919 Malignant neoplasm of unspecified site of unspecified female breast: Secondary | ICD-10-CM

## 2016-04-28 NOTE — Progress Notes (Addendum)
Patient ID: [name] [name], female   DOB: March 22, 1961, 55 y.o.   MRN: [id number]  Chief Complaint  Patient presents with  . Results    HPI  HPI   H/O abnormal pap smear.  No pap smear for > 13 years.  Colposcopic directed biopsies done.  Presents for results.  Breast CA diagnosed in 2014, treated.    Past Medical History  Diagnosis Date  . Hypertension   . Anxiety   . Depression   . Headache(784.0)   . Sleep apnea     cpcp  . Shortness of breath   . Breast cancer ([facility]) 03/27/13    right  . Hx of radiation therapy 10/02/13- 11/21/13    right chest wall, right supraclav/axillary region 5040 cGy 28 sessions, right chest wall mastectomy scar boost 1000 cGy 5 sessions    Past Surgical History  Procedure Laterality Date  . Mastectomy, radical Right 04/08/2013  . Simple mastectomy with axillary sentinel node biopsy Left 04/08/2013  . Mastectomy modified radical Right 04/07/2013    Procedure: RIGHT MASTECTOMY MODIFIED RADICAL;  Surgeon: Shann Medal, MD;  Location: [facility];  Service: General;  Laterality: Right;  . Simple mastectomy with axillary sentinel node biopsy Left 04/07/2013    Procedure:  LEFT SMPLE MASTECTOMY WITH AXILLARY SENTINEL NODE BIOPSY;  Surgeon: Shann Medal, MD;  Location: [facility];  Service: General;  Laterality: Left;  . Portacath placement Left 04/07/2013    Procedure: INSERTION PORT-A-CATH;  Surgeon: Shann Medal, MD;  Location: [facility];  Service: General;  Laterality: Left;    Family History  Problem Relation Age of Onset  . Breast cancer Mother 78  . Breast cancer Maternal Grandmother 25  . Prostate cancer Paternal Grandfather 26    Social History Social History  Substance Use Topics  . Smoking status: Never Smoker   . Smokeless tobacco: Never Used  . Alcohol Use: No    Allergies  Allergen Reactions  . Vicodin [Hydrocodone-Acetaminophen] Anaphylaxis  . Codeine Nausea And Vomiting  . Penicillins Rash  . Percocet [Oxycodone-Acetaminophen] Itching  .  Septra [Bactrim] Other (See Comments)    Heart races    Current Outpatient Prescriptions  Medication Sig Dispense Refill  . acyclovir (ZOVIRAX) 400 MG tablet Take 1 tablet (400 mg total) by mouth 2 (two) times daily. 60 tablet 5  . ALPRAZolam (XANAX) 0.5 MG tablet Take 0.5 mg by mouth 2 (two) times daily as needed for anxiety.     Marland Kitchen aspirin 81 MG tablet Take 81 mg by mouth daily.    Marland Kitchen diltiazem 2 % GEL Apply 1 application topically 2 (two) times daily. (Patient taking differently: Apply 1 application topically as needed. ) 30 g 1  . escitalopram (LEXAPRO) 20 MG tablet Take 20 mg by mouth daily.    Marland Kitchen letrozole (FEMARA) 2.5 MG tablet Take 1 tablet (2.5 mg total) by mouth daily. 90 tablet 3  . lidocaine-prilocaine (EMLA) cream Apply 1 application topically as needed. 30 g 1  . meloxicam (MOBIC) 15 MG tablet Take 15 mg by mouth daily.    . metoprolol succinate (TOPROL-XL) 25 MG 24 hr tablet Take 25 mg by mouth daily.    . miconazole (MICOTIN) 2 % powder Apply topically as needed for itching. Reported on 01/18/2016    . triamterene-hydrochlorothiazide (MAXZIDE-25) 37.5-25 MG per tablet Take 1 tablet by mouth daily.     No current facility-administered medications for this visit.    Review of Systems Review of Systems Constitutional:  negative for fatigue and weight loss Respiratory: negative for cough and wheezing Cardiovascular: negative for chest pain, fatigue and palpitations Gastrointestinal: negative for abdominal pain and change in bowel habits Genitourinary:positive for AUB Integument/breast: negative for nipple discharge Musculoskeletal:negative for myalgias Neurological: negative for gait problems and tremors Behavioral/Psych: negative for abusive relationship, depression Endocrine: negative for temperature intolerance     Blood pressure 162/86, pulse 77, temperature 98.4 F (36.9 C), weight 222 lb (100.699 kg), last menstrual period 03/07/2016.  Physical Exam Physical Exam:   Deferred  100% of 20 min visit spent on counseling and coordination of care.   Data Reviewed Pap IG and HPV (high risk) DNA detection  Status: Finalresult Visible to patient:  Not Released Nextappt: 05/02/2016 at 03:00 PM in Obstetrics and Gynecology (Thamara Leger A, MD) Dx:  Encounter for routine gynecological e...          Notes Recorded by Romana Juniper, RN on 04/13/2016 at 6:33 PM Patient notified- Colpo scheduled Notes Recorded by Shelly Bombard, MD on 04/12/2016 at 5:27 AM Schedule consult for colposcopy.     Ref Range 3wk ago    DIAGNOSIS:  Comment (A)   Comments: EPITHELIAL CELL ABNORMALITY.  ATYPICAL SQUAMOUS CELLS, CANNOT EXCLUDE HIGH-GRADE SQUAMOUS  INTRAEPITHELIAL LESION.     Specimen adequacy:  Comment   Comments: Satisfactory for evaluation. Endocervical and/or squamous metaplastic  cells (endocervical component) are present.     CLINICIAN PROVIDED ICD10:  Comment   Comments: Z01.419  Z12.4     Performed by:  Comment   Comments: Dondra Spry, Cytotechnologist (ASCP)   Electronically signed by:  Comment   Comments: Franco Collet, MD, Pathologist   PAP SMEAR COMMENT  .   PATHOLOGIST PROVIDED ICD10:  Comment   Comments: R87.611   Note:  Comment   Comments: The Pap smear is a screening test designed to aid in the detection of  premalignant and malignant conditions of the uterine cervix. It is not a  diagnostic procedure and should not be used as the sole means of detecting  cervical cancer. Both false-positive and false-negative reports do occur.     Test Methodology  Comment   Comments: This liquid based ThinPrep(R) pap test was screened with the  use of an image guided system.     HPV, high-risk Negative  Negative   Comments: This high-risk HPV test detects thirteen high-risk types  (16/18/31/33/35/39/45/51/52/56/58/59/68) without differentiation.     Resulting Agency [facility]     Narrative     Performed at:  [facility] [address] [place], [place] [id number] Lab Director: Lindon Romp MD, Phone: [phone] Performed at: [facility] [address], [place], [place] [id number] Lab Director: Lindon Romp MD, Phone: [phone] Specimen Comment: Source............Marland KitchenCervix Specimen Comment: LMP / Prev Treat.Marland KitchenMarland KitchenLMP=[id number] Specimen Comment: No. of containers.Marland Kitchen01 CYTYC Thin Prep Vial       Specimen Collected: 04/06/16 4:47 PM Last Resulted: 04/10/16 4:39 PM Lab Flowsheet Order Details View Encounter Lab and Collection Details Routing Result History           Patient: CAMRON, DOLAND T Collected: 04/18/2016 Client: [facility] Accession: [id number] Received: 04/19/2016 Juanda Crumble A. Jodi Mourning, MD DOB: [birth year] Age: 26 Gender: F Reported: 04/25/2016 [address], [address] Patient Ph: [phone] MRN #: [id number] [place], [place] [address] Client Acc#: Chart #: [id number] Phone: Fax: [phone] CC: REPORT OF SURGICAL PATHOLOGY FINAL DIAGNOSIS Diagnosis 1. Endocervix, curettage - BENIGN SQUAMOUS MUCOSA WITH SUBMUCOSAL METASTATIC LOBULAR CARCINOMA  Patient ID: [name] [name], female   DOB: March 22, 1961, 55 y.o.   MRN: [id number]  Chief Complaint  Patient presents with  . Results    HPI  HPI   H/O abnormal pap smear.  No pap smear for > 13 years.  Colposcopic directed biopsies done.  Presents for results.  Breast CA diagnosed in 2014, treated.    Past Medical History  Diagnosis Date  . Hypertension   . Anxiety   . Depression   . Headache(784.0)   . Sleep apnea     cpcp  . Shortness of breath   . Breast cancer ([facility]) 03/27/13    right  . Hx of radiation therapy 10/02/13- 11/21/13    right chest wall, right supraclav/axillary region 5040 cGy 28 sessions, right chest wall mastectomy scar boost 1000 cGy 5 sessions    Past Surgical History  Procedure Laterality Date  . Mastectomy, radical Right 04/08/2013  . Simple mastectomy with axillary sentinel node biopsy Left 04/08/2013  . Mastectomy modified radical Right 04/07/2013    Procedure: RIGHT MASTECTOMY MODIFIED RADICAL;  Surgeon: Shann Medal, MD;  Location: [facility];  Service: General;  Laterality: Right;  . Simple mastectomy with axillary sentinel node biopsy Left 04/07/2013    Procedure:  LEFT SMPLE MASTECTOMY WITH AXILLARY SENTINEL NODE BIOPSY;  Surgeon: Shann Medal, MD;  Location: [facility];  Service: General;  Laterality: Left;  . Portacath placement Left 04/07/2013    Procedure: INSERTION PORT-A-CATH;  Surgeon: Shann Medal, MD;  Location: [facility];  Service: General;  Laterality: Left;    Family History  Problem Relation Age of Onset  . Breast cancer Mother 78  . Breast cancer Maternal Grandmother 25  . Prostate cancer Paternal Grandfather 26    Social History Social History  Substance Use Topics  . Smoking status: Never Smoker   . Smokeless tobacco: Never Used  . Alcohol Use: No    Allergies  Allergen Reactions  . Vicodin [Hydrocodone-Acetaminophen] Anaphylaxis  . Codeine Nausea And Vomiting  . Penicillins Rash  . Percocet [Oxycodone-Acetaminophen] Itching  .  Septra [Bactrim] Other (See Comments)    Heart races    Current Outpatient Prescriptions  Medication Sig Dispense Refill  . acyclovir (ZOVIRAX) 400 MG tablet Take 1 tablet (400 mg total) by mouth 2 (two) times daily. 60 tablet 5  . ALPRAZolam (XANAX) 0.5 MG tablet Take 0.5 mg by mouth 2 (two) times daily as needed for anxiety.     Marland Kitchen aspirin 81 MG tablet Take 81 mg by mouth daily.    Marland Kitchen diltiazem 2 % GEL Apply 1 application topically 2 (two) times daily. (Patient taking differently: Apply 1 application topically as needed. ) 30 g 1  . escitalopram (LEXAPRO) 20 MG tablet Take 20 mg by mouth daily.    Marland Kitchen letrozole (FEMARA) 2.5 MG tablet Take 1 tablet (2.5 mg total) by mouth daily. 90 tablet 3  . lidocaine-prilocaine (EMLA) cream Apply 1 application topically as needed. 30 g 1  . meloxicam (MOBIC) 15 MG tablet Take 15 mg by mouth daily.    . metoprolol succinate (TOPROL-XL) 25 MG 24 hr tablet Take 25 mg by mouth daily.    . miconazole (MICOTIN) 2 % powder Apply topically as needed for itching. Reported on 01/18/2016    . triamterene-hydrochlorothiazide (MAXZIDE-25) 37.5-25 MG per tablet Take 1 tablet by mouth daily.     No current facility-administered medications for this visit.    Review of Systems Review of Systems Constitutional:

## 2016-05-02 ENCOUNTER — Ambulatory Visit: Payer: Medicaid Other | Admitting: Obstetrics

## 2016-05-03 ENCOUNTER — Ambulatory Visit: Payer: Self-pay | Attending: Gynecologic Oncology | Admitting: Gynecologic Oncology

## 2016-05-03 ENCOUNTER — Encounter: Payer: Self-pay | Admitting: Gynecologic Oncology

## 2016-05-03 VITALS — BP 158/90 | HR 77 | Temp 98.2°F | Resp 18 | Ht 62.0 in | Wt 226.0 lb

## 2016-05-03 DIAGNOSIS — Z853 Personal history of malignant neoplasm of breast: Secondary | ICD-10-CM | POA: Insufficient documentation

## 2016-05-03 DIAGNOSIS — Z9010 Acquired absence of unspecified breast and nipple: Secondary | ICD-10-CM | POA: Insufficient documentation

## 2016-05-03 DIAGNOSIS — F419 Anxiety disorder, unspecified: Secondary | ICD-10-CM | POA: Insufficient documentation

## 2016-05-03 DIAGNOSIS — Z7982 Long term (current) use of aspirin: Secondary | ICD-10-CM | POA: Insufficient documentation

## 2016-05-03 DIAGNOSIS — C7982 Secondary malignant neoplasm of genital organs: Secondary | ICD-10-CM | POA: Insufficient documentation

## 2016-05-03 DIAGNOSIS — F329 Major depressive disorder, single episode, unspecified: Secondary | ICD-10-CM | POA: Insufficient documentation

## 2016-05-03 DIAGNOSIS — C799 Secondary malignant neoplasm of unspecified site: Secondary | ICD-10-CM

## 2016-05-03 DIAGNOSIS — G4730 Sleep apnea, unspecified: Secondary | ICD-10-CM | POA: Insufficient documentation

## 2016-05-03 DIAGNOSIS — R0602 Shortness of breath: Secondary | ICD-10-CM | POA: Insufficient documentation

## 2016-05-03 DIAGNOSIS — I10 Essential (primary) hypertension: Secondary | ICD-10-CM | POA: Insufficient documentation

## 2016-05-03 DIAGNOSIS — R87611 Atypical squamous cells cannot exclude high grade squamous intraepithelial lesion on cytologic smear of cervix (ASC-H): Secondary | ICD-10-CM

## 2016-05-03 DIAGNOSIS — C50919 Malignant neoplasm of unspecified site of unspecified female breast: Secondary | ICD-10-CM

## 2016-05-03 DIAGNOSIS — Z885 Allergy status to narcotic agent status: Secondary | ICD-10-CM | POA: Insufficient documentation

## 2016-05-03 DIAGNOSIS — Z6841 Body Mass Index (BMI) 40.0 and over, adult: Secondary | ICD-10-CM

## 2016-05-03 DIAGNOSIS — Z880 Allergy status to penicillin: Secondary | ICD-10-CM | POA: Insufficient documentation

## 2016-05-03 NOTE — Patient Instructions (Signed)
Plan to have an ultrasound this Friday at [facility] at 11:30am.  Arrive with a full bladder.  We have your PET scan scheduled for Monday, June 12 at [facility] at 2 pm with arrival at 1:30pm.  Nothing to eat or drink 6 hours before (Nothing after 8 am).  Please call for any questions or concerns.  We will be calling you with the results and to discuss a plan.

## 2016-05-03 NOTE — Progress Notes (Signed)
Consult Note: Gyn-Onc  Consult was requested by Dr. Baltazar Najjar and Dr Gunnar Bulla Magrinat for the evaluation of [name] [name] 55 y.o. female  CC:  Chief Complaint  Patient presents with  . Metastatic Breast Ca to Cervix    New Consult  . ASCUS Pap    Assessment/Plan:  [name]  is a 55 y.o.  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 This is asymptomatic. It is extensively infiltrating the cervix which is enlarged.  I recommend first proceeding with PET/CT to evaluate for the extent of recurrent disease. If this is not localized recurrence to the uterus/cervix, I see no reason to perform hysterectomy in this patient as a hysterectomy would likely require a radical resection due to the cervical infiltration, and would involve substantial perioperative risks in this morbidly obese woman. Radiation would likely be a more appropriate option to treat localized uterine recurrence of her breast cancer.  We will also obtain an [place] to better evaluate uterine and ovarian characteristics which are limited on physical exam due to body habitus.  HPI: [name] [name] is a 55 year old para swollen woman who is seen in consultation at the request of Dr. Jodi Mourning for recurrent metastatic lobular breast cancer in the uterus. The patient has a remote history of stage III lobular breast cancer in the right treated with bilateral mastectomies, right axillary lymph node dissection, chemotherapy and radiation completed on 11/21/2013. She is had no evidence of disease since that time and was treated with initially tamoxifen and more recently Femara antiestrogen therapy. She had an episode of vaginal spotting in April 2017. She was referred to see Dr. Baltazar Najjar who performed a Pap smear which was positive for atypical cells. A colposcopic evaluation of the cervix revealed a cervical biopsy with submucosal metastatic lobular carcinoma of the breast and endocervical curettage also showed  submucosal metastatic lobular carcinoma of the breast. Slides were compared with her previous breast cancer and were compatible with recurrence.  Since her bleeding episode in April 2017 she's had no further cervical bleeding and denies vaginal discharge. She has no pelvic symptoms. She does however complain of increasing arthritis symptoms in her first digits. She also expresses intermittent bone pain in the right clavicle. She denies neurologic symptoms concerning for central neurologic recurrence.   Current Meds:  Outpatient Encounter Prescriptions as of 05/03/2016  Medication Sig  . acyclovir (ZOVIRAX) 400 MG tablet Take 1 tablet (400 mg total) by mouth 2 (two) times daily.  Marland Kitchen ALPRAZolam (XANAX) 0.5 MG tablet Take 0.5 mg by mouth 2 (two) times daily as needed for anxiety.   Marland Kitchen aspirin 81 MG tablet Take 81 mg by mouth daily.  Marland Kitchen diltiazem 2 % GEL Apply 1 application topically 2 (two) times daily. (Patient taking differently: Apply 1 application topically as needed. )  . escitalopram (LEXAPRO) 20 MG tablet Take 20 mg by mouth daily.  Marland Kitchen letrozole (FEMARA) 2.5 MG tablet Take 1 tablet (2.5 mg total) by mouth daily.  Marland Kitchen lidocaine-prilocaine (EMLA) cream Apply 1 application topically as needed.  . meloxicam (MOBIC) 15 MG tablet Take 15 mg by mouth daily.  . metoprolol succinate (TOPROL-XL) 25 MG 24 hr tablet Take 25 mg by mouth daily.  . miconazole (MICOTIN) 2 % powder Apply topically as needed for itching. Reported on 01/18/2016  . triamterene-hydrochlorothiazide (MAXZIDE-25) 37.5-25 MG per tablet Take 1 tablet by mouth daily.   No facility-administered encounter medications on file as of 05/03/2016.  Allergy:  Allergies  Allergen Reactions  . Vicodin [Hydrocodone-Acetaminophen] Anaphylaxis  . Codeine Nausea And Vomiting  . Penicillins Rash  . Percocet [Oxycodone-Acetaminophen] Itching  . Septra [Bactrim] Other (See Comments)    Heart races    Social Hx:   Social History   Social History   . Marital Status: Married    Spouse Name: N/A  . Number of Children: 1  . Years of Education: N/A   Occupational History  . Not on file.   Social History Main Topics  . Smoking status: Never Smoker   . Smokeless tobacco: Never Used  . Alcohol Use: No  . Drug Use: No  . Sexual Activity: Yes     Comment: menarche age 54, 66st live birth 63, hx of  BCP, menopause 9/13 but menstruated March 2014   Other Topics Concern  . Not on file   Social History Narrative    Past Surgical Hx:  Past Surgical History  Procedure Laterality Date  . Mastectomy, radical Right 04/08/2013  . Simple mastectomy with axillary sentinel node biopsy Left 04/08/2013  . Mastectomy modified radical Right 04/07/2013    Procedure: RIGHT MASTECTOMY MODIFIED RADICAL;  Surgeon: Shann Medal, MD;  Location: [facility];  Service: General;  Laterality: Right;  . Simple mastectomy with axillary sentinel node biopsy Left 04/07/2013    Procedure:  LEFT SMPLE MASTECTOMY WITH AXILLARY SENTINEL NODE BIOPSY;  Surgeon: Shann Medal, MD;  Location: [facility];  Service: General;  Laterality: Left;  . Portacath placement Left 04/07/2013    Procedure: INSERTION PORT-A-CATH;  Surgeon: Shann Medal, MD;  Location: [facility];  Service: General;  Laterality: Left;    Past Medical Hx:  Past Medical History  Diagnosis Date  . Hypertension   . Anxiety   . Depression   . Headache(784.0)   . Sleep apnea     cpcp  . Shortness of breath   . Breast cancer ([facility]) 03/27/13    right  . Hx of radiation therapy 10/02/13- 11/21/13    right chest wall, right supraclav/axillary region 5040 cGy 28 sessions, right chest wall mastectomy scar boost 1000 cGy 5 sessions    Past Gynecological History:  SVD x 2  Patient's last menstrual period was 03/07/2016.  Family Hx:  Family History  Problem Relation Age of Onset  . Breast cancer Mother 57  . Breast cancer Maternal Grandmother 110  . Prostate cancer Paternal Grandfather 4    Review of  Systems:  Constitutional  Feels well,    ENT Normal appearing ears and nares bilaterally Skin/Breast  No rash, sores, jaundice, itching, dryness Cardiovascular  No chest pain, shortness of breath, or edema  Pulmonary  No cough or wheeze.  Gastro Intestinal  No nausea, vomitting, or diarrhoea. No bright red blood per rectum, no abdominal pain, change in bowel movement, or constipation.  Genito Urinary  No frequency, urgency, dysuria, + bleeding in April 2017, + urinary incontinence Musculo Skeletal  + pain in thumbs and 1st fingers and in right clavicle Neurologic  No weakness, numbness, change in gait,  Psychology  No depression, anxiety, insomnia.   Vitals:  Blood pressure 158/90, pulse 77, temperature 98.2 F (36.8 C), temperature source Oral, resp. rate 18, height 5\' 2"  (1.575 m), weight 226 lb (102.513 kg), last menstrual period 03/07/2016, SpO2 97 %.  Physical Exam: WD in NAD Neck  Supple NROM, without any enlargements.  Lymph Node Survey No cervical supraclavicular, axillary or inguinal adenopathy Cardiovascular  Pulse normal  rate, regularity and rhythm. S1 and S2 normal.  Lungs  Clear to auscultation bilateraly, without wheezes/crackles/rhonchi. Good air movement.  Skin  No rash/lesions/breakdown  Psychiatry  Alert and oriented to person, place, and time  Abdomen  Normoactive bowel sounds, abdomen soft, non-tender and obese without evidence of hernia.  Back No CVA tenderness Genito Urinary  Vulva/vagina: Normal external female genitalia.  No lesions. No discharge or bleeding.  Bladder/urethra:  No lesions or masses, well supported bladder  Vagina: normal  Cervix: Enlarged and irregular contour/shape but normal appearing mucosa. Palpably enlarged to 5cm, irregular, very firm (rock hard), and infiltrating circumferentially to surrounding tissues.   Uterus: difficult to discern due to body habitus, bulky, mobile, no parametrial involvement or nodularity.  Adnexa:  no discrete palpable masses. Rectal  Good tone, no masses no cul de sac nodularity.  Extremities  No bilateral cyanosis, clubbing or edema.   Donaciano Eva, MD  05/03/2016, 11:39 AM

## 2016-05-04 ENCOUNTER — Encounter: Payer: Self-pay | Admitting: *Deleted

## 2016-05-04 NOTE — Progress Notes (Signed)
[name] [name]  Clinical Social [name] was referred by nurse for assessment of psychosocial needs as pt requesting to update will and financial concerns.  Clinical Social Worker contacted patient at home to offer support and assess for needs. CSW left vm for pt to return call. CSW team can only assist with living will, HCPOA paperwork not legal will. CSW will attempt to follow up and assess needs. CSW awaits return call.     [name] [name], [name]  [place] Phone: [phone] Fax: [phone]

## 2016-05-05 ENCOUNTER — Ambulatory Visit (HOSPITAL_COMMUNITY)
Admission: RE | Admit: 2016-05-05 | Discharge: 2016-05-05 | Disposition: A | Discharge status: Home / Self Care | Payer: Medicaid Other | Source: Ambulatory Visit | Attending: Gynecologic Oncology | Admitting: Gynecologic Oncology

## 2016-05-05 DIAGNOSIS — C50919 Malignant neoplasm of unspecified site of unspecified female breast: Secondary | ICD-10-CM | POA: Insufficient documentation

## 2016-05-05 DIAGNOSIS — C799 Secondary malignant neoplasm of unspecified site: Secondary | ICD-10-CM | POA: Insufficient documentation

## 2016-05-05 DIAGNOSIS — R938 Abnormal findings on diagnostic imaging of other specified body structures: Secondary | ICD-10-CM | POA: Insufficient documentation

## 2016-05-05 DIAGNOSIS — C7982 Secondary malignant neoplasm of genital organs: Secondary | ICD-10-CM

## 2016-05-05 DIAGNOSIS — R1909 Other intra-abdominal and pelvic swelling, mass and lump: Secondary | ICD-10-CM | POA: Insufficient documentation

## 2016-05-08 ENCOUNTER — Ambulatory Visit (HOSPITAL_COMMUNITY)
Admission: RE | Admit: 2016-05-08 | Discharge: 2016-05-08 | Disposition: A | Discharge status: Home / Self Care | Payer: Medicaid Other | Source: Ambulatory Visit | Attending: Gynecologic Oncology | Admitting: Gynecologic Oncology

## 2016-05-08 DIAGNOSIS — C799 Secondary malignant neoplasm of unspecified site: Secondary | ICD-10-CM

## 2016-05-08 DIAGNOSIS — R188 Other ascites: Secondary | ICD-10-CM | POA: Insufficient documentation

## 2016-05-08 DIAGNOSIS — C7982 Secondary malignant neoplasm of genital organs: Secondary | ICD-10-CM | POA: Insufficient documentation

## 2016-05-08 DIAGNOSIS — M799 Soft tissue disorder, unspecified: Secondary | ICD-10-CM | POA: Insufficient documentation

## 2016-05-08 DIAGNOSIS — C50919 Malignant neoplasm of unspecified site of unspecified female breast: Secondary | ICD-10-CM | POA: Insufficient documentation

## 2016-05-08 DIAGNOSIS — Z9013 Acquired absence of bilateral breasts and nipples: Secondary | ICD-10-CM | POA: Insufficient documentation

## 2016-05-08 DIAGNOSIS — C7951 Secondary malignant neoplasm of bone: Secondary | ICD-10-CM | POA: Insufficient documentation

## 2016-05-08 DIAGNOSIS — N1330 Unspecified hydronephrosis: Secondary | ICD-10-CM | POA: Insufficient documentation

## 2016-05-08 LAB — GLUCOSE, CAPILLARY: Glucose-Capillary: 107 mg/dL — ABNORMAL HIGH (ref 65–99)

## 2016-05-08 MED ORDER — FLUDEOXYGLUCOSE F - 18 (FDG) INJECTION: 12.1000 | Freq: Once | INTRAVENOUS | Status: DC | PRN

## 2016-05-09 ENCOUNTER — Telehealth: Payer: Self-pay

## 2016-05-09 ENCOUNTER — Telehealth: Payer: Self-pay | Admitting: Gynecologic Oncology

## 2016-05-09 ENCOUNTER — Other Ambulatory Visit: Payer: Self-pay | Admitting: Oncology

## 2016-05-09 ENCOUNTER — Other Ambulatory Visit: Payer: Self-pay | Admitting: Nurse Practitioner

## 2016-05-09 NOTE — Telephone Encounter (Signed)
Left message with patient asking her to please call the office so her PET scan and ultrasound can be discussed.

## 2016-05-09 NOTE — Telephone Encounter (Signed)
Orders received from [name], [name] to contact [facility] : [phone] place a referral for "Right Hydronephrosis" for 1st available appointment . [facility] , the patient was appointment scheduled for a consultation with Dr [name] on August 07 , 2017 at 10:45 PM . The patient will be receiving a packet in the mail with all required information and directions. Writer attempted to contact the patient , [name] [name] was not available , spoke with [name] [name] (daughter) gave [name] [name] the date and time of scheduled consultation with Dr [name]. [name] [name] denies further questions at this time , our contact information was provided if [name] has any questions or concerns.

## 2016-05-09 NOTE — Telephone Encounter (Signed)
Returned call to patient.  Patient informed of PET scan results.  Stating she would like to discuss in the office as well.  Appt made for tomorrow.  All questions answered.  Advised to call for any questions or concerns.  Dr. Jana Hakim made aware of results as well.

## 2016-05-10 ENCOUNTER — Ambulatory Visit (HOSPITAL_BASED_OUTPATIENT_CLINIC_OR_DEPARTMENT_OTHER): Payer: Self-pay | Admitting: Oncology

## 2016-05-10 ENCOUNTER — Telehealth: Payer: Self-pay | Admitting: Oncology

## 2016-05-10 ENCOUNTER — Ambulatory Visit: Payer: Self-pay | Attending: Gynecologic Oncology | Admitting: Gynecologic Oncology

## 2016-05-10 ENCOUNTER — Encounter: Payer: Self-pay | Admitting: Gynecologic Oncology

## 2016-05-10 VITALS — BP 143/94 | HR 72 | Temp 98.5°F | Resp 18 | Ht 62.0 in | Wt 224.1 lb

## 2016-05-10 DIAGNOSIS — Z885 Allergy status to narcotic agent status: Secondary | ICD-10-CM | POA: Insufficient documentation

## 2016-05-10 DIAGNOSIS — F419 Anxiety disorder, unspecified: Secondary | ICD-10-CM | POA: Insufficient documentation

## 2016-05-10 DIAGNOSIS — R0602 Shortness of breath: Secondary | ICD-10-CM | POA: Insufficient documentation

## 2016-05-10 DIAGNOSIS — C7989 Secondary malignant neoplasm of other specified sites: Secondary | ICD-10-CM

## 2016-05-10 DIAGNOSIS — C7951 Secondary malignant neoplasm of bone: Secondary | ICD-10-CM | POA: Insufficient documentation

## 2016-05-10 DIAGNOSIS — C7962 Secondary malignant neoplasm of left ovary: Secondary | ICD-10-CM | POA: Insufficient documentation

## 2016-05-10 DIAGNOSIS — Z923 Personal history of irradiation: Secondary | ICD-10-CM | POA: Insufficient documentation

## 2016-05-10 DIAGNOSIS — C773 Secondary and unspecified malignant neoplasm of axilla and upper limb lymph nodes: Secondary | ICD-10-CM

## 2016-05-10 DIAGNOSIS — Z9011 Acquired absence of right breast and nipple: Secondary | ICD-10-CM | POA: Insufficient documentation

## 2016-05-10 DIAGNOSIS — C786 Secondary malignant neoplasm of retroperitoneum and peritoneum: Secondary | ICD-10-CM | POA: Insufficient documentation

## 2016-05-10 DIAGNOSIS — N1330 Unspecified hydronephrosis: Secondary | ICD-10-CM | POA: Insufficient documentation

## 2016-05-10 DIAGNOSIS — Z7982 Long term (current) use of aspirin: Secondary | ICD-10-CM | POA: Insufficient documentation

## 2016-05-10 DIAGNOSIS — C55 Malignant neoplasm of uterus, part unspecified: Secondary | ICD-10-CM | POA: Insufficient documentation

## 2016-05-10 DIAGNOSIS — N134 Hydroureter: Secondary | ICD-10-CM | POA: Insufficient documentation

## 2016-05-10 DIAGNOSIS — I10 Essential (primary) hypertension: Secondary | ICD-10-CM | POA: Insufficient documentation

## 2016-05-10 DIAGNOSIS — Z880 Allergy status to penicillin: Secondary | ICD-10-CM | POA: Insufficient documentation

## 2016-05-10 DIAGNOSIS — F329 Major depressive disorder, single episode, unspecified: Secondary | ICD-10-CM | POA: Insufficient documentation

## 2016-05-10 DIAGNOSIS — C50411 Malignant neoplasm of upper-outer quadrant of right female breast: Secondary | ICD-10-CM

## 2016-05-10 DIAGNOSIS — G4730 Sleep apnea, unspecified: Secondary | ICD-10-CM | POA: Insufficient documentation

## 2016-05-10 DIAGNOSIS — C7960 Secondary malignant neoplasm of unspecified ovary: Secondary | ICD-10-CM | POA: Insufficient documentation

## 2016-05-10 DIAGNOSIS — C50919 Malignant neoplasm of unspecified site of unspecified female breast: Secondary | ICD-10-CM | POA: Insufficient documentation

## 2016-05-10 MED ORDER — PALBOCICLIB 125 MG PO CAPS: 125.0000 mg | ORAL_CAPSULE | Freq: Every day | ORAL | Status: DC

## 2016-05-10 NOTE — Telephone Encounter (Signed)
appt made and avs printed °

## 2016-05-10 NOTE — Progress Notes (Signed)
Patient ID: [name] [name], female   DOB: 19-Sep-1961, 55 y.o.   MRN: [id number] ID: [name] [name] OB: [birth year]  MR#: [id number]  ZSW#:[id number]  PCP: Morton Peters, MD GYN:   SU: Alphonsa Overall, MD OTHER MD: Arloa Koh, MD;  Cleone Slim, MD;  Alphonzo Severance, MD; Crissie Reese, MD  CHIEF COMPLAINT:  Hx of Bilateral Breast Cancers  CURRENT TREATMENT: fulvestrant, palbociclib  BREAST CANCER HISTORY: From the original intake note:  [name] had not been able to have mammography for several years because of cost issues. More recently, she had noted a mass in her right breast but thought this might be MRSA. The urinary tract infection took her to her physician's office, and she brought the breast mass to the attention of [name] Pisciotta NP. She set up the patient for bilateral diagnostic mammography and right breast ultrasound at the [facility] 03/27/2013, which showed a 4 cm irregular mass in the upper outer right breast, with a 9 mm cluster of calcifications in the posterior lower right breast and a few mildly enlarged level I right axillary lymph nodes. The breast mass was palpable and ultrasound showed it to measure 4.1 cm, with some of the level I right axillary lymph nodes noted to have lost their fatty hilum.  Biopsy of both the suspicious areas in the right breast as well as one of the suspicious lymph nodes was performed 03/27/2013 and showed ([id number]) the larger breast mass to consist of an invasive ductal carcinoma with lobular features, grade 2, estrogen and progesterone receptor both 100% positive, with an MIB-1 of 15%, and no HER-2 amplification. 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 The sampled right axillary lymph node was also positive.  Bilateral breast MRI 04/01/2013 showed the larger right breast mass to measure 5.4 cm, with a satellite nodule immediately inferior to it measuring 8 mm. The area of non-masslike enhancement separately  biopsied and found to be ductal carcinoma in situ measured 2.8 cm. There were multiple enlarged right axillary lymph nodes, the largest measuring 2.8 cm. In addition, in the left breast there were 2 foci on non-masslike enhancement consistent with DCIS measuring 1.3 cm and 2.2 cm. These weren't different quadrants.  The patient's subsequent history is as noted below  INTERVAL HISTORY: [name] [name] returns today for follow up of her now metastatic breast cancer, accompanied by her mother and daughter. Since her last visit here [name] [name] developed some vaginal spotting, which was evaluated by Dr. Jodi Mourning with a Pap smear. This was abnormal so a colposcopy and endocervical curettage was obtained 04/18/2016, showing ([id number]) metastatic lobular carcinoma, E-cadherin negative, gross cystic disease fluid protein and estrogen receptor positive.  The patient was referred to Dr. Everitt Amber for possible resection, but a PET scan obtained by Dr. Denman George on 05/08/2016 showed in addition to uterine involvement, a left pelvic mass, upper abdominal adenopathy, peritoneal carcinomatosis with ascites, and multiple bone lesions. There was also right hydronephrosis. The patient is referred back for systemic therapy.  REVIEW OF SYSTEMS: [name] [name] continues to experience multiple symptoms including severe fatigue, pain "from my stomach to my thumbs", with stomach cramps, as well as back and joint pain. She finds walking difficult. She has some drainage in her ears, runny nose, difficulty swallowing, and a sore throat. Her circulation is "poor". She is short of breath with walking, complains of poor appetite, with occasional vomiting, dribbling urine, stress urinary incontinence, weakness, forgetfulness, anxiety, depression, phobias,  and problems with the blood sugar. A detailed review of systems today was otherwise stable  PAST MEDICAL HISTORY: Past Medical History  Diagnosis Date  . Hypertension   . Anxiety   . Depression   .  Headache(784.0)   . Sleep apnea     cpcp  . Shortness of breath   . Breast cancer ([place]) 03/27/13    right  . Hx of radiation therapy 10/02/13- 11/21/13    right chest wall, right supraclav/axillary region 5040 cGy 28 sessions, right chest wall mastectomy scar boost 1000 cGy 5 sessions    PAST SURGICAL HISTORY: Past Surgical History  Procedure Laterality Date  . Mastectomy, radical Right 04/08/2013  . Simple mastectomy with axillary sentinel node biopsy Left 04/08/2013  . Mastectomy modified radical Right 04/07/2013    Procedure: RIGHT MASTECTOMY MODIFIED RADICAL;  Surgeon: Shann Medal, MD;  Location: [facility];  Service: General;  Laterality: Right;  . Simple mastectomy with axillary sentinel node biopsy Left 04/07/2013    Procedure:  LEFT SMPLE MASTECTOMY WITH AXILLARY SENTINEL NODE BIOPSY;  Surgeon: Shann Medal, MD;  Location: [facility];  Service: General;  Laterality: Left;  . Portacath placement Left 04/07/2013    Procedure: INSERTION PORT-A-CATH;  Surgeon: Shann Medal, MD;  Location: [facility];  Service: General;  Laterality: Left;    FAMILY HISTORY Family History  Problem Relation Age of Onset  . Breast cancer Mother 55  . Breast cancer Maternal Grandmother 43  . Prostate cancer Paternal Grandfather 62   the patient's father died at age 53 in an airplane crash (he was a Engineer, maintenance (IT) unsupervised). The patient's mother is alive at age 46. Her name is [name] [name] and she works for [employer]. [name] [name] has a history of breast cancer diagnosed at age 42. She had one sister who is alive and well. [name]'s mother, the patient's grandmother, was diagnosed with breast cancer at age 31. She had 2 sisters, neither with breast cancer. Alfrieda herself had one brother, no sisters. There is no other history of breast or ovary cancer in the family.  GYNECOLOGIC HISTORY:   (updated 02/03/2014)  Menarche age 43, she is GX P26, first live birth age 51. The patient was taking birth control pills until  September 2013. She stopped having periods at that time, but had one in late March 2014.  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 She is now unemployed. Her husband [name] [name] (goes by "Richardson [name]") is disabled secondary to diabetes. They were separated for the past 2 years, but Richardson [name] recently moved back in with [place]. [name] [name] lives with her mother [name] [name] and 62 poodles. The patient's daughter [name] [name] is 10 and also lives with the patient.    ADVANCED DIRECTIVES: In place. The patient has named her daughter [name] [name] as her healthcare power of attorney, bypassing her husband.  HEALTH MAINTENANCE:  (Updated 02/03/2014) Social History  Substance Use Topics  . Smoking status: Never Smoker   . Smokeless tobacco: Never Used  . Alcohol Use: No     Colonoscopy: Never  PAP: Possibly 2006, Not on file  Bone density: Possibly 2004, Not on file  Lipid panel: Not on file  Allergies  Allergen Reactions  . Vicodin [Hydrocodone-Acetaminophen] Anaphylaxis  . Codeine Nausea And Vomiting  . Penicillins Rash  . Percocet [Oxycodone-Acetaminophen] Itching  . Septra [Bactrim] Other (See Comments)    Heart races    Current Outpatient Prescriptions  Medication  Sig Dispense Refill  . acyclovir (ZOVIRAX) 400 MG tablet Take 1 tablet (400 mg total) by mouth 2 (two) times daily. 60 tablet 5  . ALPRAZolam (XANAX) 0.5 MG tablet Take 0.5 mg by mouth 2 (two) times daily as needed for anxiety.     Marland Kitchen aspirin 81 MG tablet Take 81 mg by mouth daily.    Marland Kitchen diltiazem 2 % GEL Apply 1 application topically 2 (two) times daily. (Patient taking differently: Apply 1 application topically as needed. ) 30 g 1  . escitalopram (LEXAPRO) 20 MG tablet Take 20 mg by mouth daily.    Marland Kitchen letrozole (FEMARA) 2.5 MG tablet Take 1 tablet (2.5 mg total) by mouth daily. 90 tablet 3  . lidocaine-prilocaine (EMLA) cream Apply 1 application topically as needed. 30 g 1  . meloxicam  (MOBIC) 15 MG tablet Take 15 mg by mouth daily.    . metoprolol succinate (TOPROL-XL) 25 MG 24 hr tablet Take 25 mg by mouth daily.    . miconazole (MICOTIN) 2 % powder Apply topically as needed for itching. Reported on 01/18/2016    . triamterene-hydrochlorothiazide (MAXZIDE-25) 37.5-25 MG per tablet Take 1 tablet by mouth daily.     No current facility-administered medications for this visit.   Facility-Administered Medications Ordered in Other Visits  Medication Dose Route Frequency Provider Last Rate Last Dose  . fludeoxyglucose F - 18 (FDG) injection 12.1 milli Curie  12.1 milli Curie Intravenous Once PRN Nolon Nations, MD        OBJECTIVE:    Filed Vitals:   05/10/16 1416  BP: 143/94  Pulse: 72  Temp: 98.5 F (36.9 C)  Resp: 18     Body mass index is 40.98 kg/(m^2).    ECOG FS: 2 Filed Weights   05/10/16 1416  Weight: 224 lb 1.6 oz (101.651 kg)    Middle-aged white woman who appears stated age  Sclerae unicteric, pupils round and equal Oropharynx clear and moist-- no thrush or other lesions No cervical or supraclavicular adenopathy Lungs no rales or rhonchi Heart regular rate and rhythm Abd soft, obese, nontender, positive bowel sounds, no masses palpated MSK no focal spinal tenderness, no upper extremity lymphedema Neuro: nonfocal, well oriented, appropriate affect Breasts: Status post bilateral mastectomies.  LAB RESULTS:  Lab Results  Component Value Date   WBC 10.7* 01/18/2016   NEUTROABS 7.7* 01/18/2016   HGB 12.3 01/18/2016   HCT 36.5 01/18/2016   MCV 91.6 01/18/2016   PLT 278 01/18/2016      Chemistry      Component Value Date/Time   NA 138 01/18/2016 1048   NA 138 10/08/2013 1246   K 4.0 01/18/2016 1048   K 3.2* 10/08/2013 1246   CL 100 10/08/2013 1246   CL 103 05/20/2013 1404   CO2 25 01/18/2016 1048   CO2 20 10/08/2013 1246   BUN 22.6 01/18/2016 1048   BUN 13 10/08/2013 1246   CREATININE 1.3* 01/18/2016 1048   CREATININE 1.50*  10/08/2013 1246      Component Value Date/Time   CALCIUM 9.7 01/18/2016 1048   CALCIUM 9.8 10/08/2013 1246   ALKPHOS 58 01/18/2016 1048   ALKPHOS 63 04/17/2013 0741   AST 14 01/18/2016 1048   AST 23 04/17/2013 0741   ALT 12 01/18/2016 1048   ALT 26 04/17/2013 0741   BILITOT 0.34 01/18/2016 1048   BILITOT 0.2* 04/17/2013 0741       STUDIES: US Transvaginal Non-ob  June 04, 2016  CLINICAL DATA:  55 year old G2 P1,  personal history of right breast cancer in 2014, with recent Pap smear demonstrating atypical squamous cells and possible high-grade squamous intraepithelial lesion. EXAM: TRANSABDOMINAL AND TRANSVAGINAL ULTRASOUND OF PELVIS TECHNIQUE: Both transabdominal and transvaginal ultrasound examinations of the pelvis were performed. Transabdominal technique was performed for global imaging of the pelvis including uterus, ovaries, adnexal regions, and pelvic cul-de-sac. It was necessary to proceed with endovaginal exam following the transabdominal exam to visualize the endometrium and ovaries. COMPARISON:  CT abdomen and pelvis 10/08/2013.  PET-CT 04/04/2013. FINDINGS: Uterus Measurements: Approximately 11.9 x 4.8 x 7.9 cm. Retroverted. Heterogeneous myometrium without focal fibroid. Endometrium Thickness: 12 mm.  No visible endometrial mass. Right ovary Not visualized either transabdominally or transvaginally. Left ovary Not visualized either transabdominally or transvaginally. Other findings Mass involving the left adnexum, best visualized on the transabdominal images, measuring approximately 10.2 x 9.8 x 10.0 cm, associated with a small to moderate amount of free pelvic fluid. IMPRESSION: 1. Approximate 10 cm left adnexal mass. 2. Endometrial thickening up to 12 mm without visible endometrial mass. 3. Neither ovary was visualized transabdominally or transvaginally. If further imaging followup is felt necessary to better characterize the left adnexal mass and the endometrial thickening, MRI of the  pelvis without with contrast is recommended. Electronically Signed   By: Evangeline Dakin M.D.   On: 05/05/2016 15:22   US Pelvis Complete  05/05/2016  CLINICAL DATA:  55 year old G2 P1, personal history of right breast cancer in 2014, with recent Pap smear demonstrating atypical squamous cells and possible high-grade squamous intraepithelial lesion. EXAM: TRANSABDOMINAL AND TRANSVAGINAL ULTRASOUND OF PELVIS TECHNIQUE: Both transabdominal and transvaginal ultrasound examinations of the pelvis were performed. Transabdominal technique was performed for global imaging of the pelvis including uterus, ovaries, adnexal regions, and pelvic cul-de-sac. It was necessary to proceed with endovaginal exam following the transabdominal exam to visualize the endometrium and ovaries. COMPARISON:  CT abdomen and pelvis 10/08/2013.  PET-CT 04/04/2013. FINDINGS: Uterus Measurements: Approximately 11.9 x 4.8 x 7.9 cm. Retroverted. Heterogeneous myometrium without focal fibroid. Endometrium Thickness: 12 mm.  No visible endometrial mass. Right ovary Not visualized either transabdominally or transvaginally. Left ovary Not visualized either transabdominally or transvaginally. Other findings Mass involving the left adnexum, best visualized on the transabdominal images, measuring approximately 10.2 x 9.8 x 10.0 cm, associated with a small to moderate amount of free pelvic fluid. IMPRESSION: 1. Approximate 10 cm left adnexal mass. 2. Endometrial thickening up to 12 mm without visible endometrial mass. 3. Neither ovary was visualized transabdominally or transvaginally. If further imaging followup is felt necessary to better characterize the left adnexal mass and the endometrial thickening, MRI of the pelvis without with contrast is recommended. Electronically Signed   By: Evangeline Dakin M.D.   On: 05/05/2016 15:22   Nm Pet Image Restag (ps) Skull Base To Thigh  05/08/2016  CLINICAL DATA:  Subsequent treatment strategy for metastatic  breast cancer. EXAM: NUCLEAR MEDICINE PET SKULL BASE TO THIGH TECHNIQUE: 12.8 mCi F-18 FDG was injected intravenously. Full-ring PET imaging was performed from the skull base to thigh after the radiotracer. CT data was obtained and used for attenuation correction and anatomic localization. FASTING BLOOD GLUCOSE:  Value: 107 mg/dl COMPARISON:  Pelvic ultrasound dated 05/05/2016. CT chest dated 02/03/2014. CT abdomen pelvis dated 10/08/2013. PET-CT dated 04/04/2013. FINDINGS: NECK No hypermetabolic lymph nodes in the neck. CHEST Status post bilateral mastectomy. Postsurgical changes with mild hypermetabolism along the right chest wall and axilla. Left chest port terminating at the cavoatrial junction. Mild cardiomegaly. No  pericardial effusion. Three vessel coronary atherosclerosis. No hypermetabolic thoracic lymphadenopathy. Lungs are essentially clear.  No pleural effusion or pneumothorax. ABDOMEN/PELVIS No abnormal hypermetabolic activity within the liver, pancreas, adrenal glands, or spleen. 8.6 x 11.5 cm mass along the left anterior pelvis (series 4/image 160), favored to reflect a left adnexal metastasis, max SUV 15.4. Heterogeneous appearance of the uterus with endometrial/endocervical hypermetabolism, max SUV 12.9, nonspecific by imaging but corresponding to known metastatic cancer. 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 Small upper abdominal lymph nodes in the porta hepatis measuring up to 9 mm short axis, max SUV 5.0. Moderate right hydronephrosis, new from priors. SKELETON Multifocal osseous metastases in the pelvis, including: --Left superior acetabulum, max SUV 4.0 --Left posterior column of the acetabulum, max SUV 6.8 --Right iliac bone, max SUV 7.6 IMPRESSION: Status post bilateral mastectomy with right axillary lymph node dissection. Postsurgical changes with mild  hypermetabolism along the right chest wall/axilla. Hypermetabolism in the endometrial and endocervical cavity, nonspecific by imaging, but corresponding to known metastatic cancer. 8.6 x 11.5 cm mass along the left anterior pelvis, favored to reflect a left adnexal metastasis. 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 Suspected small nodal metastases in the upper abdomen/porta hepatis. Osseous metastases in the pelvis, as above. Moderate right hydronephrosis. Electronically Signed   By: Julian Hy M.D.   On: 05/08/2016 15:54    ASSESSMENT: 55 y.o. 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 There were also 2 suspicious areas in the left breast noted by MRI.  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 Patient's testing pending  (5)  completed adjuvant chemotherapy, consisting of 6 cycles of docetaxel/ doxorubicin/ cyclophosphamide given on a Q. three-week basis, first dose  05/21/2013, last dose 09/03/2013   (6)  postmastectomy radiation therapy, completed 11/21/2013  (7) tamoxifen started December 2014, continued intermittently due to financial  problems  (a) letrozole started September 2016, discontinued June 2017 with metastatic recurrence  METASTATIC DISEASE: May 2017 (8) PET scan 05/08/2016 findings hypermetabolic metastases in the uterus, left pelvis, peritoneum, upper abdomen adenopathy, and bones, with ascites and peritoneal disease but no evidence of liver or lung involvement  (a) cervical biopsy 05/23/2017shows metastatic lobular breast cancer with a signet ring component,E-cadherin negative, gross cystic disease fluid protein and estrogen receptor positive.  (9) fulvestrant to start 05/17/2016, with palbociclib at 125 mg daily,21/7  (10) right hydronephrosis noted on PET scan 05/08/2016  PLAN:  I discussed the PET scan results and biopsy results in detail with [name] [name] her daughter and mother today. They understand that stage IV breast cancer is not curable with our current knowledge base. The goal of treatment is  control. The strategy of treatment is to do only the minimum necessary to control the growth of the tumor so that the patient can have as normal a life as possible. There is no survival advantage in treating aggressively if treating less aggressively results in tumor control. With this strategy stage IV breast cancer in many cases can function as a "chronic illness": something that cannot be quite gotten rid of but can be controlled for an indefinite period of time  There are always 3 questions to go over and so we reviewed those today. The first question is do we treat or not. In some cases the patient's overall situation is so discouraging that palliative/comfort care alone is appropriate. If the decision is made to treat, then the next question is whether anti-estrogens or chemotherapy is more appropriate. Once that decision is made than the third question is: Which agent or combination of agents in particular should be used?  Belky has very clear that she wants to "live longer" even if she has significant side effects from  treatment. We then discussed whether or not to use anti-estrogens or chemotherapy. Given her very spotty use of anti-estrogens in the past,I thinkit is worthwhile trying fulvestrant/palbociclib, which we can monitor closely, and which if effective could possibly control the cancer for a significant period of time.  We then discussed the possible toxicities, side effectsand complicationsofthese agents.I went ahead and enter the orders. She will receive her first fulvestrant dose on 05/17/2016 and she could start her palbociclib at the same time assuming we can obtain it for her. Once she starts the palbociclib we will check her labs on a weekly basis until she is on a stable dose.  She is applying for Medicaid at this point. She also has an appointment with Dr. Tresa Moore to consider stenting of her right hydronephrosis.  She is going to see me with her second dose of fulvestrant. She knows to call for any problems that may develop before that visit.    Chauncey Cruel, MD   05/10/2016

## 2016-05-10 NOTE — Progress Notes (Signed)
Consult Note: Gyn-Onc  Consult was requested by Dr. Baltazar Najjar and Dr Gunnar Bulla Magrinat for the evaluation of [name] [name] 54 y.o. female  CC:  Chief Complaint  Patient presents with  . metastatic breast cancer to uterus and peritoneum    follow-up from scan     Assessment/Plan:  [name] [name]  is a 55 y.o.  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 Her uterine disease asymptomatic. She has right moderate hydronephrosis, likely from parametrial extension of her tumor.  I am recommending that she see Dr Jana Hakim to discuss systemic therapy for her widely metastatic recurrent breast cancer. Surgery is not an appropriate option for this patient. She may be a candidate for stenting of the right ureter, and has an appointment with [facility] physicians to discuss this further.  If she develops heavy (requiring blood transfusions) bleeding from the uterus in the future, she could be considered for pelvic RT (palliative dosing).  HPI: [name] [name] is a 55 year old para swollen woman who is seen in consultation at the request of Dr. Jodi Mourning for recurrent metastatic lobular breast cancer in the uterus. The patient has a remote history of stage III lobular breast cancer in the right treated with bilateral mastectomies, right axillary lymph node dissection, chemotherapy and radiation completed on 11/21/2013. She is had no evidence of disease since that time and was treated with initially tamoxifen and more recently Femara antiestrogen therapy. She had an episode of vaginal spotting in April 2017. She was referred to see Dr. Baltazar Najjar who performed a Pap smear which was positive for atypical cells. A colposcopic evaluation of the cervix revealed a cervical biopsy with submucosal metastatic lobular carcinoma of the breast and endocervical curettage also showed submucosal  metastatic lobular carcinoma of the breast. Slides were compared with her previous breast cancer and were compatible with recurrence.  Since her bleeding episode in April 2017 she's had no further cervical bleeding and denies vaginal discharge. She has no pelvic symptoms. She does however complain of increasing arthritis symptoms in her first digits. She also expresses intermittent bone pain in the right clavicle. She denies neurologic symptoms concerning for central neurologic recurrence.   Interval Hx:  On 05/05/16 she underwent PET/CT from skull base to mid thigh which confirmed hypermetabolic areas consistent with metastases in the left ovary measuring approximately 11 cm, uterus, peritoneal studding, ascites, omental nodularity, enlarged avid retroperitoneal lymph nodes in the upper abdomen and porta hepatis. There were metastatic lesions in the left acetabulum and the right iliac wing. There is a right hydronephrosis that was moderate.  Current Meds:  Outpatient Encounter Prescriptions as of 05/10/2016  Medication Sig  . acyclovir (ZOVIRAX) 400 MG tablet Take 1 tablet (400 mg total) by mouth 2 (two) times daily.  Marland Kitchen ALPRAZolam (XANAX) 0.5 MG tablet Take 0.5 mg by mouth 2 (two) times daily as needed for anxiety.   Marland Kitchen aspirin 81 MG tablet Take 81 mg by mouth daily.  Marland Kitchen diltiazem 2 % GEL Apply 1 application topically 2 (two) times daily. (Patient taking differently: Apply 1 application topically as needed. )  . escitalopram (LEXAPRO) 20 MG tablet Take 20 mg by mouth daily.  Marland Kitchen letrozole (FEMARA) 2.5 MG tablet Take 1 tablet (2.5 mg total) by mouth daily.  Marland Kitchen lidocaine-prilocaine (EMLA) cream Apply 1 application topically as needed.  . meloxicam (MOBIC) 15 MG tablet Take 15 mg by  mouth daily.  . metoprolol succinate (TOPROL-XL) 25 MG 24 hr tablet Take 25 mg by mouth daily.  . miconazole (MICOTIN) 2 % powder Apply topically as needed for itching. Reported on 01/18/2016  . triamterene-hydrochlorothiazide  (MAXZIDE-25) 37.5-25 MG per tablet Take 1 tablet by mouth daily.   Facility-Administered Encounter Medications as of 05/10/2016  Medication  . fludeoxyglucose F - 18 (FDG) injection 12.1 milli Curie    Allergy:  Allergies  Allergen Reactions  . Vicodin [Hydrocodone-Acetaminophen] Anaphylaxis  . Codeine Nausea And Vomiting  . Penicillins Rash  . Percocet [Oxycodone-Acetaminophen] Itching  . Septra [Bactrim] Other (See Comments)    Heart races    Social Hx:   Social History   Social History  . Marital Status: Married    Spouse Name: N/A  . Number of Children: 1  . Years of Education: N/A   Occupational History  . Not on file.   Social History Main Topics  . Smoking status: Never Smoker   . Smokeless tobacco: Never Used  . Alcohol Use: No  . Drug Use: No  . Sexual Activity: Yes     Comment: menarche age 53, 8st live birth 42, hx of  BCP, menopause 9/13 but menstruated March 2014   Other Topics Concern  . Not on file   Social History Narrative    Past Surgical Hx:  Past Surgical History  Procedure Laterality Date  . Mastectomy, radical Right 04/08/2013  . Simple mastectomy with axillary sentinel node biopsy Left 04/08/2013  . Mastectomy modified radical Right 04/07/2013    Procedure: RIGHT MASTECTOMY MODIFIED RADICAL;  Surgeon: Shann Medal, MD;  Location: [facility];  Service: General;  Laterality: Right;  . Simple mastectomy with axillary sentinel node biopsy Left 04/07/2013    Procedure:  LEFT SMPLE MASTECTOMY WITH AXILLARY SENTINEL NODE BIOPSY;  Surgeon: Shann Medal, MD;  Location: [facility];  Service: General;  Laterality: Left;  . Portacath placement Left 04/07/2013    Procedure: INSERTION PORT-A-CATH;  Surgeon: Shann Medal, MD;  Location: [place];  Service: General;  Laterality: Left;    Past Medical Hx:  Past Medical History  Diagnosis Date  . Hypertension   . Anxiety   . Depression   . Headache(784.0)   . Sleep apnea     cpcp  . Shortness of breath   .  Breast cancer ([place]) 03/27/13    right  . Hx of radiation therapy 10/02/13- 11/21/13    right chest wall, right supraclav/axillary region 5040 cGy 28 sessions, right chest wall mastectomy scar boost 1000 cGy 5 sessions    Past Gynecological History:  SVD x 2  Patient's last menstrual period was 03/07/2016.  Family Hx:  Family History  Problem Relation Age of Onset  . Breast cancer Mother 39  . Breast cancer Maternal Grandmother 21  . Prostate cancer Paternal Grandfather 48    Review of Systems:  Constitutional  Feels well,    ENT Normal appearing ears and nares bilaterally Skin/Breast  No rash, sores, jaundice, itching, dryness Cardiovascular  No chest pain, shortness of breath, or edema  Pulmonary  No cough or wheeze.  Gastro Intestinal  No nausea, vomitting, or diarrhoea. No bright red blood per rectum, no abdominal pain, change in bowel movement, or constipation.  Genito Urinary  No frequency, urgency, dysuria, + bleeding in April 2017, + urinary incontinence Musculo Skeletal  + pain in thumbs and 1st fingers and in right clavicle Neurologic  No weakness, numbness, change in gait,  Psychology  No depression, anxiety, insomnia.   Vitals:  Blood pressure 143/94, pulse 72, temperature 98.5 F (36.9 C), temperature source Oral, resp. rate 18, height 5\' 2"  (1.575 m), weight 224 lb 1.6 oz (101.651 kg), last menstrual period 03/07/2016, SpO2 100 %.  Physical Exam: deferred   20 minutes of direct face to face counseling time was spent with the patient. This included discussion about prognosis, therapy recommendations and scan results.  Donaciano Eva, MD  05/10/2016, 2:18 PM

## 2016-05-10 NOTE — Telephone Encounter (Signed)
Patient contacted and updated with appointment for [facility]  [facility]: [phone] , address [place] , [address]nd floor) , has been moved up per [name] , APNP to Thursday June 15 , 2017 at 8 AM . The patient is to arrive at 7:45 AM with proof of insurance , photo ID and co-pay if applicable . Attempted to reach the patient , uneventful , left a detailed message with [facility] contact information and address. Call back requested to ensure the patient did receive the message of consultation date being changed, Melissa Cross, APNP is aware.

## 2016-05-11 ENCOUNTER — Telehealth: Payer: Self-pay | Admitting: Pharmacist

## 2016-05-11 NOTE — Telephone Encounter (Signed)
I s/w pt over phone re: Ibrance initiation. Pt has [place] Medicaid and per [facility] OP RX, cost exceeds limitations. We have samples available for pt to get her started on tx.   She is coming 6/21 for lab/inj and she will come for the samples then and also to sign enrollment forms for [place] Patient Assistance.  She will bring in tax return 1040 forms from 2015 since she has filed for extension on 2016 taxes. She asks that we defer counseling until 6/21.  Drug interaction noted between Ibrance (weak CYP3A4 inhibitor) and alprazolam and Lexapro (both are 3A4 substrates).  Ibrance may increase the serum concentration of both alprazolam and Lexapro.  Source: Lexicomp Drug Interactions, accessed 05/11/16.  Ibrance PI cautions that "greater care and possible dose reductions of 3A4 substrates w/ narrow therapeutic index may be needed."  Kennith Center, Pharm.D., CPP 05/11/2016@3 :[place]

## 2016-05-11 NOTE — Telephone Encounter (Signed)
I left vm on pts moblie # asking her to return call to [facility] re: Leslee Home Rx. The Ibrance RX exceeds the limitations of her Medicaid coverage. I will be able to supply pt w/ a 1 month supply sample of Ibrance 125 mg capsules. Pt also needs to come to [facility] to complete enrollment for [place] pt assistance. Kennith Center, Pharm.D., CPP 05/11/2016@10 :35 AM [facility]

## 2016-05-16 ENCOUNTER — Other Ambulatory Visit: Payer: Self-pay | Admitting: *Deleted

## 2016-05-16 DIAGNOSIS — C50411 Malignant neoplasm of upper-outer quadrant of right female breast: Secondary | ICD-10-CM

## 2016-05-17 ENCOUNTER — Ambulatory Visit (HOSPITAL_BASED_OUTPATIENT_CLINIC_OR_DEPARTMENT_OTHER): Payer: Self-pay

## 2016-05-17 ENCOUNTER — Telehealth: Payer: Self-pay | Admitting: Pharmacist

## 2016-05-17 ENCOUNTER — Other Ambulatory Visit: Payer: Self-pay | Admitting: *Deleted

## 2016-05-17 ENCOUNTER — Encounter: Payer: Self-pay | Admitting: *Deleted

## 2016-05-17 ENCOUNTER — Other Ambulatory Visit: Payer: Self-pay

## 2016-05-17 ENCOUNTER — Encounter: Payer: Self-pay | Admitting: Pharmacist

## 2016-05-17 VITALS — BP 143/103 | HR 67 | Temp 98.2°F | Resp 18

## 2016-05-17 DIAGNOSIS — C50411 Malignant neoplasm of upper-outer quadrant of right female breast: Secondary | ICD-10-CM

## 2016-05-17 DIAGNOSIS — Z5111 Encounter for antineoplastic chemotherapy: Secondary | ICD-10-CM

## 2016-05-17 MED ORDER — FULVESTRANT 250 MG/5ML IM SOLN
500.0000 mg | Freq: Once | INTRAMUSCULAR | Status: AC
  Administered 2016-05-17: 500 mg via INTRAMUSCULAR
  Filled 2016-05-17: qty 10

## 2016-05-17 NOTE — Telephone Encounter (Signed)
Pt brought in tax documents from [id number] to complete application for Ibrance patient assistance with [employer]. She also signed the enrollment forms.  I faxed to [place] Patient Assistance at [phone]. Fax confirmation received.

## 2016-05-17 NOTE — Progress Notes (Incomplete)
Oral Chemotherapy Pharmacist Encounter I spoke with patient for overview of new oral chemotherapy medication: Ibrance.  I also provided pt with written information regarding Ibrance.  Pt is feeling slightly overwhelmed with the number of recent appointments and amount of information she is receiving.    Pt was given one sample bottle of Ibrance 125mg  today.   Counseled patient on administration, dosing, side effects, safe handling, and monitoring. Side effects include but not limited to: Fatigue, low blood counts, hair loss, nausea, diarrhea and mouth sores. Ms. Moline voiced understanding and appreciation.   We also discussed the drug interactions between Ibrance and Xanax and Lexapro. She stated she used to be on Xanax TID and she is currently taking BID for about the last year. She would like to go back to TID. She understands the drug interaction. She feels she may need additional medications to help her anxiety. She will monitor. She will wait to see how she is affected by Leslee Home before requesting additional Xanax doses.  All questions answered.  Will follow up in 1-2 weeks for adherence and toxicity management.   Thank you,  Raul Del, PharmD, BCPS, [facility]  [phone]

## 2016-05-17 NOTE — Patient Instructions (Signed)

## 2016-05-17 NOTE — Progress Notes (Signed)
[name] [name] Social Work  Clinical Social Work was referred by pt  to review and complete healthcare advance directives.  Clinical Social Worker met with patient and her daughter, [name] in [place] office.  The patient designated [name] as their primary healthcare agent and [name] as their secondary agent.  Patient also completed healthcare living will.    Clinical Social Worker notarized documents and made copies for patient/family. Clinical Social Worker will send documents to medical records to be scanned into patient's chart. Clinical Social Worker encouraged patient/family to contact with any additional questions or concerns.  Loren Racer, [place] Worker [place]  [place] Phone: [phone] Fax: [phone]

## 2016-05-18 ENCOUNTER — Encounter: Payer: Self-pay | Admitting: Oncology

## 2016-05-18 NOTE — Progress Notes (Signed)
Per notes from dr. Jana Hakim the patient is applying for medicaid.

## 2016-05-22 ENCOUNTER — Encounter: Payer: Self-pay | Admitting: Oncology

## 2016-05-22 ENCOUNTER — Telehealth: Payer: Self-pay

## 2016-05-22 NOTE — Progress Notes (Signed)
See prev notes. No insurance. But she has applied for medicaid. Recd notice of no insurance.

## 2016-05-22 NOTE — Telephone Encounter (Signed)
Received a call from [employer] Patient Assistance that Ms [name] [name] has been approved for Ibrance drug assistance from 05/22/16 until 11/26/16.  Leslee Home will be provided to her from [place] and be sent to her home directly.  The prescription for Leslee Home is currently being processed and they will contact the patient to set up delivery.  Should we need to speak with them we can contact [place] Patient Assistance at [phone].  Thank you  Henreitta Leber, PharmD [facility]

## 2016-05-23 NOTE — Progress Notes (Unsigned)
Received notification from [place] that patient has been accepted into assistance plan to receive her Ibrance from the manufacturer.  Approved thru 11/26/16  Henreitta Leber, PharmD [facility]

## 2016-05-25 ENCOUNTER — Telehealth: Payer: Self-pay | Admitting: Pharmacist

## 2016-05-25 NOTE — Telephone Encounter (Signed)
Attempted to reach patient for follow up on oral medication: Ibrance. No answer. Left VM for patient to call back with any questions or issues.   Thank you, Kennith Center, Pharm.D., CPP 05/25/2016@4 :09 PM [facility]

## 2016-05-29 ENCOUNTER — Other Ambulatory Visit: Payer: Self-pay

## 2016-05-29 DIAGNOSIS — C50411 Malignant neoplasm of upper-outer quadrant of right female breast: Secondary | ICD-10-CM

## 2016-05-31 ENCOUNTER — Other Ambulatory Visit: Payer: Self-pay | Admitting: Oncology

## 2016-05-31 ENCOUNTER — Ambulatory Visit (HOSPITAL_BASED_OUTPATIENT_CLINIC_OR_DEPARTMENT_OTHER): Payer: Self-pay

## 2016-05-31 ENCOUNTER — Other Ambulatory Visit (HOSPITAL_BASED_OUTPATIENT_CLINIC_OR_DEPARTMENT_OTHER): Payer: Self-pay

## 2016-05-31 ENCOUNTER — Ambulatory Visit: Payer: Self-pay

## 2016-05-31 DIAGNOSIS — C50411 Malignant neoplasm of upper-outer quadrant of right female breast: Secondary | ICD-10-CM

## 2016-05-31 DIAGNOSIS — Z95828 Presence of other vascular implants and grafts: Secondary | ICD-10-CM

## 2016-05-31 DIAGNOSIS — Z5111 Encounter for antineoplastic chemotherapy: Secondary | ICD-10-CM

## 2016-05-31 LAB — COMPREHENSIVE METABOLIC PANEL
ALBUMIN: 3.5 g/dL (ref 3.5–5.0)
ALK PHOS: 64 U/L (ref 40–150)
ALT: 16 U/L (ref 0–55)
ANION GAP: 11 meq/L (ref 3–11)
AST: 15 U/L (ref 5–34)
BUN: 30.1 mg/dL — ABNORMAL HIGH (ref 7.0–26.0)
CALCIUM: 8.9 mg/dL (ref 8.4–10.4)
CO2: 25 mEq/L (ref 22–29)
Chloride: 101 mEq/L (ref 98–109)
Creatinine: 2.6 mg/dL — ABNORMAL HIGH (ref 0.6–1.1)
EGFR: 20 mL/min/{1.73_m2} — AB (ref >90)
Glucose: 103 mg/dl (ref 70–140)
Potassium: 3.9 mEq/L (ref 3.5–5.1)
Sodium: 136 mEq/L (ref 136–145)
TOTAL PROTEIN: 7.1 g/dL (ref 6.4–8.3)

## 2016-05-31 LAB — CBC WITH DIFFERENTIAL/PLATELET
BASO%: 1 % (ref 0.0–2.0)
Basophils Absolute: 0.1 10*3/uL (ref 0.0–0.1)
EOS ABS: 0.4 10*3/uL (ref 0.0–0.5)
EOS%: 5.4 % (ref 0.0–7.0)
HCT: 35.5 % (ref 34.8–46.6)
HGB: 11.9 g/dL (ref 11.6–15.9)
LYMPH%: 23.7 % (ref 14.0–49.7)
MCH: 30.8 pg (ref 25.1–34.0)
MCHC: 33.6 g/dL (ref 31.5–36.0)
MCV: 91.6 fL (ref 79.5–101.0)
MONO#: 0.2 10*3/uL (ref 0.1–0.9)
MONO%: 2.4 % (ref 0.0–14.0)
NEUT%: 67.5 % (ref 38.4–76.8)
NEUTROS ABS: 4.7 10*3/uL (ref 1.5–6.5)
PLATELETS: 149 10*3/uL (ref 145–400)
RBC: 3.88 10*6/uL (ref 3.70–5.45)
RDW: 14.2 % (ref 11.2–14.5)
WBC: 6.9 10*3/uL (ref 3.9–10.3)
lymph#: 1.6 10*3/uL (ref 0.9–3.3)

## 2016-05-31 MED ORDER — FULVESTRANT 250 MG/5ML IM SOLN
500.0000 mg | Freq: Once | INTRAMUSCULAR | Status: AC
  Administered 2016-05-31: 500 mg via INTRAMUSCULAR
  Filled 2016-05-31: qty 10

## 2016-05-31 MED ORDER — SODIUM CHLORIDE 0.9 % IJ SOLN
10.0000 mL | INTRAMUSCULAR | Status: DC | PRN
  Administered 2016-05-31: 10 mL via INTRAVENOUS
  Filled 2016-05-31: qty 10

## 2016-05-31 MED ORDER — HEPARIN SOD (PORK) LOCK FLUSH 100 UNIT/ML IV SOLN
500.0000 [IU] | Freq: Once | INTRAVENOUS | Status: AC | PRN
  Administered 2016-05-31: 500 [IU] via INTRAVENOUS
  Filled 2016-05-31: qty 5

## 2016-05-31 NOTE — Patient Instructions (Signed)

## 2016-05-31 NOTE — Progress Notes (Signed)
Injection given in flush room  

## 2016-06-01 ENCOUNTER — Telehealth: Payer: Self-pay | Admitting: Oncology

## 2016-06-01 ENCOUNTER — Telehealth: Payer: Self-pay | Admitting: *Deleted

## 2016-06-01 NOTE — Telephone Encounter (Signed)
Lab on 7/8 - WBC 6.9 ANC 4.7. Pt is currently day 13 of cycle 1 of Ibrance therapy.  Per call with pt no issues of concern.  Rescheduled appointment on 7/10 to 7/12 per day 21 of this cycle.  Pt is scheduled for lab/md on 7/19- day she is to resume next cycle.

## 2016-06-01 NOTE — Telephone Encounter (Signed)
let msg confirming 7/10 lab apt

## 2016-06-05 ENCOUNTER — Other Ambulatory Visit: Payer: Self-pay

## 2016-06-07 ENCOUNTER — Other Ambulatory Visit (HOSPITAL_BASED_OUTPATIENT_CLINIC_OR_DEPARTMENT_OTHER): Payer: Self-pay

## 2016-06-07 DIAGNOSIS — C50411 Malignant neoplasm of upper-outer quadrant of right female breast: Secondary | ICD-10-CM

## 2016-06-07 LAB — CBC WITH DIFFERENTIAL/PLATELET
BASO%: 2.3 % — ABNORMAL HIGH (ref 0.0–2.0)
Basophils Absolute: 0.1 10*3/uL (ref 0.0–0.1)
EOS%: 3.4 % (ref 0.0–7.0)
Eosinophils Absolute: 0.2 10*3/uL (ref 0.0–0.5)
HCT: 36.6 % (ref 34.8–46.6)
HGB: 12.1 g/dL (ref 11.6–15.9)
LYMPH%: 33.4 % (ref 14.0–49.7)
MCH: 30.7 pg (ref 25.1–34.0)
MCHC: 33.1 g/dL (ref 31.5–36.0)
MCV: 92.9 fL (ref 79.5–101.0)
MONO#: 0.1 10*3/uL (ref 0.1–0.9)
MONO%: 2.6 % (ref 0.0–14.0)
NEUT#: 3 10*3/uL (ref 1.5–6.5)
NEUT%: 58.3 % (ref 38.4–76.8)
Platelets: 101 10*3/uL — ABNORMAL LOW (ref 145–400)
RBC: 3.94 10*6/uL (ref 3.70–5.45)
RDW: 14.6 % — ABNORMAL HIGH (ref 11.2–14.5)
WBC: 5.1 10*3/uL (ref 3.9–10.3)
lymph#: 1.7 10*3/uL (ref 0.9–3.3)

## 2016-06-07 LAB — COMPREHENSIVE METABOLIC PANEL WITH GFR
ALT: 13 U/L (ref 0–55)
AST: 15 U/L (ref 5–34)
Albumin: 3.6 g/dL (ref 3.5–5.0)
Alkaline Phosphatase: 68 U/L (ref 40–150)
Anion Gap: 10 meq/L (ref 3–11)
BUN: 18.9 mg/dL (ref 7.0–26.0)
CO2: 25 meq/L (ref 22–29)
Calcium: 8.9 mg/dL (ref 8.4–10.4)
Chloride: 100 meq/L (ref 98–109)
Creatinine: 2.3 mg/dL — ABNORMAL HIGH (ref 0.6–1.1)
EGFR: 24 ml/min/1.73 m2 — ABNORMAL LOW
Glucose: 95 mg/dL (ref 70–140)
Potassium: 4.3 meq/L (ref 3.5–5.1)
Sodium: 135 meq/L — ABNORMAL LOW (ref 136–145)
Total Bilirubin: 0.42 mg/dL (ref 0.20–1.20)
Total Protein: 7.2 g/dL (ref 6.4–8.3)

## 2016-06-14 ENCOUNTER — Other Ambulatory Visit (HOSPITAL_BASED_OUTPATIENT_CLINIC_OR_DEPARTMENT_OTHER): Payer: Self-pay

## 2016-06-14 ENCOUNTER — Ambulatory Visit: Payer: Self-pay

## 2016-06-14 ENCOUNTER — Ambulatory Visit (HOSPITAL_BASED_OUTPATIENT_CLINIC_OR_DEPARTMENT_OTHER): Payer: Self-pay

## 2016-06-14 ENCOUNTER — Ambulatory Visit (HOSPITAL_BASED_OUTPATIENT_CLINIC_OR_DEPARTMENT_OTHER): Payer: Self-pay | Admitting: Oncology

## 2016-06-14 VITALS — BP 148/89 | HR 64 | Temp 98.7°F | Resp 18 | Ht 62.0 in | Wt 221.1 lb

## 2016-06-14 DIAGNOSIS — Z5111 Encounter for antineoplastic chemotherapy: Secondary | ICD-10-CM

## 2016-06-14 DIAGNOSIS — C7960 Secondary malignant neoplasm of unspecified ovary: Secondary | ICD-10-CM

## 2016-06-14 DIAGNOSIS — C7982 Secondary malignant neoplasm of genital organs: Secondary | ICD-10-CM

## 2016-06-14 DIAGNOSIS — C50411 Malignant neoplasm of upper-outer quadrant of right female breast: Secondary | ICD-10-CM

## 2016-06-14 DIAGNOSIS — C772 Secondary and unspecified malignant neoplasm of intra-abdominal lymph nodes: Secondary | ICD-10-CM

## 2016-06-14 DIAGNOSIS — C786 Secondary malignant neoplasm of retroperitoneum and peritoneum: Secondary | ICD-10-CM

## 2016-06-14 DIAGNOSIS — C7951 Secondary malignant neoplasm of bone: Secondary | ICD-10-CM

## 2016-06-14 DIAGNOSIS — C7989 Secondary malignant neoplasm of other specified sites: Secondary | ICD-10-CM

## 2016-06-14 LAB — COMPREHENSIVE METABOLIC PANEL
ALBUMIN: 3.6 g/dL (ref 3.5–5.0)
ALK PHOS: 73 U/L (ref 40–150)
ALT: 13 U/L (ref 0–55)
AST: 14 U/L (ref 5–34)
Anion Gap: 10 mEq/L (ref 3–11)
BILIRUBIN TOTAL: 0.3 mg/dL (ref 0.20–1.20)
BUN: 20.8 mg/dL (ref 7.0–26.0)
CO2: 25 meq/L (ref 22–29)
CREATININE: 2.2 mg/dL — AB (ref 0.6–1.1)
Calcium: 9.1 mg/dL (ref 8.4–10.4)
Chloride: 101 mEq/L (ref 98–109)
EGFR: 24 mL/min/{1.73_m2} — AB (ref >90)
GLUCOSE: 110 mg/dL (ref 70–140)
Potassium: 4 mEq/L (ref 3.5–5.1)
SODIUM: 135 meq/L — AB (ref 136–145)
TOTAL PROTEIN: 7.3 g/dL (ref 6.4–8.3)

## 2016-06-14 LAB — CBC WITH DIFFERENTIAL/PLATELET
BASO%: 1.2 % (ref 0.0–2.0)
Basophils Absolute: 0.1 10*3/uL (ref 0.0–0.1)
EOS ABS: 0.2 10*3/uL (ref 0.0–0.5)
EOS%: 2.8 % (ref 0.0–7.0)
HCT: 37.2 % (ref 34.8–46.6)
HEMOGLOBIN: 12.5 g/dL (ref 11.6–15.9)
LYMPH%: 28.2 % (ref 14.0–49.7)
MCH: 31.6 pg (ref 25.1–34.0)
MCHC: 33.4 g/dL (ref 31.5–36.0)
MCV: 94.6 fL (ref 79.5–101.0)
MONO#: 0.3 10*3/uL (ref 0.1–0.9)
MONO%: 5.4 % (ref 0.0–14.0)
NEUT%: 62.4 % (ref 38.4–76.8)
NEUTROS ABS: 3.6 10*3/uL (ref 1.5–6.5)
Platelets: 134 10*3/uL — ABNORMAL LOW (ref 145–400)
RBC: 3.94 10*6/uL (ref 3.70–5.45)
RDW: 16.5 % — AB (ref 11.2–14.5)
WBC: 5.7 10*3/uL (ref 3.9–10.3)
lymph#: 1.6 10*3/uL (ref 0.9–3.3)

## 2016-06-14 MED ORDER — FULVESTRANT 250 MG/5ML IM SOLN
500.0000 mg | Freq: Once | INTRAMUSCULAR | Status: AC
  Administered 2016-06-14: 500 mg via INTRAMUSCULAR
  Filled 2016-06-14: qty 10

## 2016-06-14 MED ORDER — ALTEPLASE 2 MG IJ SOLR
2.0000 mg | Freq: Once | INTRAMUSCULAR | Status: DC | PRN
  Filled 2016-06-14: qty 2

## 2016-06-14 NOTE — Progress Notes (Signed)
Labs were drawn peripherally. Port wasn't flush. Patient stated she wasn't prepared because she didn't put on numbing creme today.

## 2016-06-14 NOTE — Progress Notes (Signed)
Patient ID: [name] [name], female   DOB: 01-Dec-1960, 55 y.o.   MRN: [id number] ID: [name] [name] OB: 03-24-1961  MR#: [id number]  OFB#:[id number]  Patient Care Team: [name] [name]., MD as PCP - General (Unknown Physician Specialty) [name] Pel, MD as Consulting Physician (Orthopedic Surgery) [name] Cruel, MD as Consulting Physician (Hematology and Oncology) [name] Bombard, MD as Consulting Physician (Obstetrics and Gynecology) [name] Amber, MD as Consulting Physician (Obstetrics and Gynecology) [name] Frock, MD as Consulting Physician (Urology) [name] Maltese, MD as Referring Physician (Internal Medicine) [name] Overall, MD as Consulting Physician (General Surgery) [name] Reese, MD as Consulting Physician (Plastic Surgery) OTHER MD:  [name] Slim, MD  CHIEF COMPLAINT:  Hx of Bilateral Breast Cancers  CURRENT TREATMENT: fulvestrant, palbociclib  BREAST CANCER HISTORY: From the original intake note:  [name] had not been able to have mammography for several years because of cost issues. More recently, she had noted a mass in her right breast but thought this might be MRSA. The urinary tract infection took her to her physician's office, and she brought the breast mass to the attention of [name] Pisciotta NP. She set up the patient for bilateral diagnostic mammography and right breast ultrasound at the [facility] 03/27/2013, which showed a 4 cm irregular mass in the upper outer right breast, with a 9 mm cluster of calcifications in the posterior lower right breast and a few mildly enlarged level I right axillary lymph nodes. The breast mass was palpable and ultrasound showed it to measure 4.1 cm, with some of the level I right axillary lymph nodes noted to have lost their fatty hilum.  Biopsy of both the suspicious areas in the right breast as well as one of the suspicious lymph nodes was performed 03/27/2013 and showed ([id number]) the larger breast mass to consist of an invasive  ductal carcinoma with lobular features, grade 2, estrogen and progesterone receptor both 100% positive, with an MIB-1 of 15%, and no HER-2 amplification. 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 The sampled right axillary lymph node was also positive.  Bilateral breast MRI 04/01/2013 showed the larger right breast mass to measure 5.4 cm, with a satellite nodule immediately inferior to it measuring 8 mm. 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 There were multiple enlarged right axillary lymph nodes, the largest measuring 2.8 cm. In addition, in the left breast there were 2 foci on non-masslike enhancement consistent with DCIS measuring 1.3 cm and 2.2 cm. These weren't different quadrants.  The patient's subsequent history is as noted below  INTERVAL HISTORY: [name] [name] returns today for follow up of her estrogen receptor positive stage IV breast cancer, accompanied by her daughter [name] [name]. She was started on fulvestrant a month ago. Today she will receive her third dose, her first monthly dose. She is tolerating this remarkably well. In particular she doesn't have problems with hot flashes and tolerates the actual injection without difficulty.--She is also on palbociclib, which she started 05/18/2016.Marland Kitchen She completed a cycle at 125 mg daily in week ago, with no side effects that she is aware of. She is ready to start her second cycle later this week.  REVIEW OF SYSTEMS: [name] [name] describes herself is moderately fatigued. She has problems with ear drainage, a runny nose, sore throat, poor circulation, shortness of breath, nausea and vomiting, diarrhea, urinary incontinence, easy bruising, joint pain, difficulty walking,  occasional headaches, weakness, forgetfulness, anxiety, and depression. Now the symptoms are more persistent or intense than prior. A detailed review of systems today was otherwise  noncontributory  PAST MEDICAL HISTORY: Past Medical History  Diagnosis Date  . Hypertension   . Anxiety   . Depression   . Headache(784.0)   . Sleep apnea     cpcp  . Shortness of breath   . Breast cancer ([place]) 03/27/13    right  . Hx of radiation therapy 10/02/13- 11/21/13    right chest wall, right supraclav/axillary region 5040 cGy 28 sessions, right chest wall mastectomy scar boost 1000 cGy 5 sessions    PAST SURGICAL HISTORY: Past Surgical History  Procedure Laterality Date  . Mastectomy, radical Right 04/08/2013  . Simple mastectomy with axillary sentinel node biopsy Left 04/08/2013  . Mastectomy modified radical Right 04/07/2013    Procedure: RIGHT MASTECTOMY MODIFIED RADICAL;  Surgeon: Shann Medal, MD;  Location: [facility];  Service: General;  Laterality: Right;  . Simple mastectomy with axillary sentinel node biopsy Left 04/07/2013    Procedure:  LEFT SMPLE MASTECTOMY WITH AXILLARY SENTINEL NODE BIOPSY;  Surgeon: Shann Medal, MD;  Location: [facility];  Service: General;  Laterality: Left;  . Portacath placement Left 04/07/2013    Procedure: INSERTION PORT-A-CATH;  Surgeon: Shann Medal, MD;  Location: [facility];  Service: General;  Laterality: Left;    FAMILY HISTORY Family History  Problem Relation Age of Onset  . Breast cancer Mother 43  . Breast cancer Maternal Grandmother 60  . Prostate cancer Paternal Grandfather 40   the patient's father died at age 51 in an airplane crash (he was a Engineer, maintenance (IT) unsupervised). The patient's mother is alive at age 7. Her name is [name] [name] and she works for [employer]. [name] [name] has a history of breast cancer diagnosed at age 72. She had one sister who is alive and well. [name] [name]'s mother, the patient's grandmother, was diagnosed with breast cancer at age 69. She had 2 sisters, neither with breast cancer. [name] [name] herself had one brother, no sisters. There is no other history of breast or ovary cancer in the family.  GYNECOLOGIC HISTORY:    (updated 02/03/2014)  Menarche age 41, she is GX P74, first live birth age 73. The patient was taking birth control pills until September 2013. She stopped having periods at that time, but had one in late March 2014.  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 She is now unemployed. Her husband Mirakle Tomlin (goes by "[name]") is disabled secondary to diabetes. They were separated for the past 2 years, but [name] recently moved back in with [place]. [place]ley lives with her mother [name] [name] and 73 poodles. The patient's daughter [name] "[name]" Brubacher is 13 and also lives with the patient.    ADVANCED DIRECTIVES: In place. The patient has named her daughter [name] as her healthcare power of attorney, bypassing her husband.  HEALTH MAINTENANCE:  (Updated 02/03/2014) Social History  Substance Use Topics  . Smoking status: Never Smoker   . Smokeless tobacco: Never Used  . Alcohol Use: No     Colonoscopy: Never  PAP: Possibly 2006, Not on file  Bone density: Possibly 2004, Not on file  Lipid panel: Not on file  Allergies  Allergen Reactions  . Vicodin [Hydrocodone-Acetaminophen] Anaphylaxis  . Codeine Nausea And Vomiting  . Penicillins Rash  . Percocet [Oxycodone-Acetaminophen] Itching  . Septra [Bactrim] Other (See Comments)  Heart races    Current Outpatient Prescriptions  Medication Sig Dispense Refill  . acyclovir (ZOVIRAX) 400 MG tablet Take 1 tablet (400 mg total) by mouth 2 (two) times daily. 60 tablet 5  . ALPRAZolam (XANAX) 0.5 MG tablet Take 0.5 mg by mouth 2 (two) times daily as needed for anxiety.     Marland Kitchen aspirin 81 MG tablet Take 81 mg by mouth daily.    Marland Kitchen diltiazem 2 % GEL Apply 1 application topically 2 (two) times daily. (Patient taking differently: Apply 1 application topically as needed. ) 30 g 1  . escitalopram (LEXAPRO) 20 MG tablet Take 20 mg by mouth daily.    Marland Kitchen letrozole (FEMARA) 2.5 MG tablet Take 1 tablet (2.5 mg total)  by mouth daily. 90 tablet 3  . lidocaine-prilocaine (EMLA) cream Apply 1 application topically as needed. 30 g 1  . meloxicam (MOBIC) 15 MG tablet Take 15 mg by mouth daily.    . metoprolol succinate (TOPROL-XL) 25 MG 24 hr tablet Take 25 mg by mouth daily.    . miconazole (MICOTIN) 2 % powder Apply topically as needed for itching. Reported on 01/18/2016    . palbociclib (IBRANCE) 125 MG capsule Take 1 capsule (125 mg total) by mouth daily with breakfast. Take whole with food. 21 capsule 4  . triamterene-hydrochlorothiazide (MAXZIDE-25) 37.5-25 MG per tablet Take 1 tablet by mouth daily.     No current facility-administered medications for this visit.    Filed Vitals:   06/14/16 1437  BP: 148/89  Pulse: 64  Temp: 98.7 F (37.1 C)  Resp: 18     Body mass index is 40.43 kg/(m^2).    ECOG FS: 2 Filed Weights   06/14/16 1437  Weight: 221 lb 1.6 oz (100.29 kg)   Sclerae unicteric, EOMs intact Oropharynx clear, slightly dry No cervical or supraclavicular adenopathy Lungs no rales or rhonchi Heart regular rate and rhythm Abd soft, obese, nontender, positive bowel sounds MSK no focal spinal tenderness Neuro: nonfocal, well oriented, appropriate affect Breasts: Status post bilateral mastectomies.  LAB RESULTS:  Lab Results  Component Value Date   WBC 5.7 06/14/2016   NEUTROABS 3.6 06/14/2016   HGB 12.5 06/14/2016   HCT 37.2 06/14/2016   MCV 94.6 06/14/2016   PLT 134* 06/14/2016      Chemistry      Component Value Date/Time   NA 135* 06/14/2016 1343   NA 138 10/08/2013 1246   K 4.0 06/14/2016 1343   K 3.2* 10/08/2013 1246   CL 100 10/08/2013 1246   CL 103 05/20/2013 1404   CO2 25 06/14/2016 1343   CO2 20 10/08/2013 1246   BUN 20.8 06/14/2016 1343   BUN 13 10/08/2013 1246   CREATININE 2.2* 06/14/2016 1343   CREATININE 1.50* 10/08/2013 1246      Component Value Date/Time   CALCIUM 9.1 06/14/2016 1343   CALCIUM 9.8 10/08/2013 1246   ALKPHOS 73 06/14/2016 1343    ALKPHOS 63 04/17/2013 0741   AST 14 06/14/2016 1343   AST 23 04/17/2013 0741   ALT 13 06/14/2016 1343   ALT 26 04/17/2013 0741   BILITOT 0.30 06/14/2016 1343   BILITOT 0.2* 04/17/2013 0741       STUDIES: No results found.  ASSESSMENT: 55 y.o. 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 There were also 2 suspicious areas in the left breast noted by MRI.  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 Patient's testing pending  (5)  completed adjuvant chemotherapy, consisting of 6 cycles of docetaxel/ doxorubicin/ cyclophosphamide given on a Q. three-week basis, first dose  05/21/2013, last dose 09/03/2013   (6)  postmastectomy radiation therapy, completed 11/21/2013  (7) tamoxifen started December 2014, continued intermittently due to financial problems  (a) letrozole started September 2016, discontinued June 2017 with metastatic recurrence  METASTATIC DISEASE: May 2017 (8) PET scan 05/08/2016 findings hypermetabolic metastases in the uterus, left pelvis, peritoneum, upper abdomen adenopathy, and bones, with ascites and peritoneal disease but no evidence of liver or lung involvement  (a) cervical biopsy  05/23/2017shows metastatic lobular breast cancer with a signet ring component,E-cadherin negative, gross cystic disease fluid protein and estrogen receptor positive.  (9) fulvestrant started 05/17/2016, with palbociclib started 05/18/2016 at 125 mg daily (21//7)  (10) right hydronephrosis noted on PET scan 05/08/2016--  (a) evaluated by urology; recommended observation  PLAN:  Courtlynn is tolerating the palbociclib remarkably well. Her white cell count and platelet counts are holding up beautifully.  Accordingly we are not making any change in her dose and she is starting her second cycle of palbociclib tomorrow.  She is also doing very well with the Faslodex. She will receive her first monthly dose today and from here on we will see her every 28 days with labs, and Faslodex.  Once she completes 4 cycles she will be restaged with a PET scan.  I don't know why she would not qualify for Medicaid. She apparently has applied and been denied. She has incurable cancer and no income. I suggested that she talk to our social workers and also contact her Cabin crew.  She is going to return to see me in one month. She knows to call for any problems that may develop before then.    [name] Cruel, MD   06/14/2016

## 2016-06-14 NOTE — Patient Instructions (Signed)

## 2016-07-12 ENCOUNTER — Telehealth: Payer: Self-pay

## 2016-07-12 ENCOUNTER — Telehealth: Payer: Self-pay | Admitting: *Deleted

## 2016-07-12 NOTE — Telephone Encounter (Signed)
Pt had questions about multiple medications. Forwarded call to nurses phone.

## 2016-07-16 NOTE — Progress Notes (Signed)
Patient ID: [name] [name], female   DOB: 04-10-1961, 55 y.o.   MRN: [id number] ID: [name] OB: 04-01-1961  MR#: [id number]  TIW#:[id number]  Patient Care Team: Morton Peters., MD as PCP - General (Unknown Physician Specialty) Meredith Pel, MD as Consulting Physician (Orthopedic Surgery) Chauncey Cruel, MD as Consulting Physician (Hematology and Oncology) Shelly Bombard, MD as Consulting Physician (Obstetrics and Gynecology) Everitt Amber, MD as Consulting Physician (Obstetrics and Gynecology) Alexis Frock, MD as Consulting Physician (Urology) Perrin Maltese, MD as Referring Physician (Internal Medicine) Alphonsa Overall, MD as Consulting Physician (General Surgery) Crissie Reese, MD as Consulting Physician (Plastic Surgery) OTHER MD:  Cleone Slim, MD  CHIEF COMPLAINT:  Hx of Bilateral Breast Cancers  CURRENT TREATMENT: fulvestrant, palbociclib  BREAST CANCER HISTORY: From the original intake note:  [name] [name] had not been able to have mammography for several years because of cost issues. More recently, she had noted a mass in her right breast but thought this might be MRSA. The urinary tract infection took her to her physician's office, and she brought the breast mass to the attention of [name] Pisciotta NP. She set up the patient for bilateral diagnostic mammography and right breast ultrasound at the [facility] 03/27/2013, which showed a 4 cm irregular mass in the upper outer right breast, with a 9 mm cluster of calcifications in the posterior lower right breast and a few mildly enlarged level I right axillary lymph nodes. The breast mass was palpable and ultrasound showed it to measure 4.1 cm, with some of the level I right axillary lymph nodes noted to have lost their fatty hilum.  Biopsy of both the suspicious areas in the right breast as well as one of the suspicious lymph nodes was performed 03/27/2013 and showed ([id number]) the larger breast mass to consist of an invasive  ductal carcinoma with lobular features, grade 2, estrogen and progesterone receptor both 100% positive, with an MIB-1 of 15%, and no HER-2 amplification. 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 The sampled right axillary lymph node was also positive.  Bilateral breast MRI 04/01/2013 showed the larger right breast mass to measure 5.4 cm, with a satellite nodule immediately inferior to it measuring 8 mm. 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 There were multiple enlarged right axillary lymph nodes, the largest measuring 2.8 cm. In addition, in the left breast there were 2 foci on non-masslike enhancement consistent with DCIS measuring 1.3 cm and 2.2 cm. These weren't different quadrants.  The patient's subsequent history is as noted below  INTERVAL HISTORY: [name] [name] returns today for follow up of her estrogen receptor positive metastatic breast cancer, accompanied by her daughter [name] [name] and by the patient's mother. [name] [name] continues on fulvestrant monthly and was supposed to have had a dose 07/12/2016, but that was not scheduled. She is also on palbociclib at 125 mg daily, but she has not received her supply this month.  [name] [name] is very excited because she has her court date for her disability within the next week. She has had no income for a considerable period of time despite her multiple problems.  REVIEW OF SYSTEMS: Wave trying to climb over a dog barrier in the house, fell, and hit her right eye area. She has "a Economist" on the right. The family did not call, but they observe her overnight to make sure she was  easily aroused, and they have watched since for any dizziness, forgetfulness, sleepiness, headaches, nausea, or vomiting. She has had none of those problems. She has pain in her ribs at times which feels like a knife is stabbing her. It last 10-15 minutes. It is not clear what brings the  son but sometimes it can be positional. She has sinus problems, difficulty swallowing, shortness of breath particularly when walking up stairs, loose bowel movements, stress urinary incontinence, easy bruising, anxiety and depression. Hot flashes are moderate. A detailed review of systems is otherwise stable  PAST MEDICAL HISTORY: Past Medical History:  Diagnosis Date  . Anxiety   . Breast cancer ([place]) 03/27/13   right  . Depression   . Headache(784.0)   . Hx of radiation therapy 10/02/13- 11/21/13   right chest wall, right supraclav/axillary region 5040 cGy 28 sessions, right chest wall mastectomy scar boost 1000 cGy 5 sessions  . Hypertension   . Shortness of breath   . Sleep apnea    cpcp    PAST SURGICAL HISTORY: Past Surgical History:  Procedure Laterality Date  . MASTECTOMY MODIFIED RADICAL Right 04/07/2013   Procedure: RIGHT MASTECTOMY MODIFIED RADICAL;  Surgeon: Shann Medal, MD;  Location: [facility];  Service: General;  Laterality: Right;  . MASTECTOMY, RADICAL Right 04/08/2013  . PORTACATH PLACEMENT Left 04/07/2013   Procedure: INSERTION PORT-A-CATH;  Surgeon: Shann Medal, MD;  Location: [facility];  Service: General;  Laterality: Left;  . SIMPLE MASTECTOMY WITH AXILLARY SENTINEL NODE BIOPSY Left 04/08/2013  . SIMPLE MASTECTOMY WITH AXILLARY SENTINEL NODE BIOPSY Left 04/07/2013   Procedure:  LEFT SMPLE MASTECTOMY WITH AXILLARY SENTINEL NODE BIOPSY;  Surgeon: Shann Medal, MD;  Location: [facility];  Service: General;  Laterality: Left;    FAMILY HISTORY Family History  Problem Relation Age of Onset  . Breast cancer Mother 36  . Breast cancer Maternal Grandmother 65  . Prostate cancer Paternal Grandfather 72   the patient's father died at age 52 in an airplane crash (he was a Engineer, maintenance (IT) unsupervised). The patient's mother is alive at age 7. Her name is [name] [name] and she works for [employer]. [name] [name] has a history of breast cancer diagnosed at age 23. She had one sister who  is alive and well. [name] [name]'s mother, the patient's grandmother, was diagnosed with breast cancer at age 67. She had 2 sisters, neither with breast cancer. Rani herself had one brother, no sisters. There is no other history of breast or ovary cancer in the family.  GYNECOLOGIC HISTORY:   (updated 02/03/2014)  Menarche age 72, she is GX P62, first live birth age 37. The patient was taking birth control pills until September 2013. She stopped having periods at that time, but had one in late March 2014.  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 She is now unemployed. Her husband [name] [name] (goes by "[name] [name]") is disabled secondary to diabetes. They were separated for the past 2 years, but [name] [name] recently moved back in with [place]. [place]ara lives with her mother [name] [name] and 44 poodles. The patient's daughter [name] [name] is 81 and also lives with the patient.    ADVANCED DIRECTIVES: In place. The patient has named her daughter [name] [name] as her healthcare power of attorney, bypassing her husband.  HEALTH MAINTENANCE:  (Updated 02/03/2014) Social History  Substance Use Topics  . Smoking status: Never Smoker  . Smokeless tobacco: Never Used  . Alcohol use No  Colonoscopy: Never  PAP: Possibly 2006, Not on file  Bone density: Possibly 2004, Not on file  Lipid panel: Not on file  Allergies  Allergen Reactions  . Vicodin [Hydrocodone-Acetaminophen] Anaphylaxis  . Codeine Nausea And Vomiting  . Penicillins Rash  . Percocet [Oxycodone-Acetaminophen] Itching  . Septra [Bactrim] Other (See Comments)    Heart races    Current Outpatient Prescriptions  Medication Sig Dispense Refill  . acyclovir (ZOVIRAX) 400 MG tablet Take 1 tablet (400 mg total) by mouth 2 (two) times daily. 60 tablet 5  . ALPRAZolam (XANAX) 0.5 MG tablet Take 1 tablet (0.5 mg total) by mouth 3 (three) times daily as needed for anxiety. 90 tablet 3  . aspirin 81 MG tablet  Take 81 mg by mouth daily.    Marland Kitchen diltiazem 2 % GEL Apply 1 application topically 2 (two) times daily. (Patient taking differently: Apply 1 application topically as needed. ) 30 g 1  . escitalopram (LEXAPRO) 20 MG tablet Take 1 tablet (20 mg total) by mouth daily. 90 tablet 3  . gabapentin (NEURONTIN) 100 MG capsule Take 1 capsule (100 mg total) by mouth 3 (three) times daily as needed. 90 capsule 3  . lidocaine-prilocaine (EMLA) cream Apply 1 application topically as needed. 30 g 1  . meloxicam (MOBIC) 15 MG tablet Take 15 mg by mouth daily.    . metoprolol succinate (TOPROL-XL) 25 MG 24 hr tablet Take 25 mg by mouth daily.    . miconazole (MICOTIN) 2 % powder Apply topically as needed for itching. Reported on 01/18/2016    . palbociclib (IBRANCE) 125 MG capsule Take 1 capsule (125 mg total) by mouth daily with breakfast. Take whole with food. 21 capsule 4  . triamterene-hydrochlorothiazide (MAXZIDE-25) 37.5-25 MG per tablet Take 1 tablet by mouth daily.     Current Facility-Administered Medications  Medication Dose Route Frequency Provider Last Rate Last Dose  . fulvestrant (FASLODEX) injection 500 mg  500 mg Intramuscular Once Chauncey Cruel, MD        Vitals:   07/17/16 1136  BP: (!) 144/100  Pulse: 77  Resp: 18  Temp: 98.6 F (37 C)     Body mass index is 40.17 kg/m.    ECOG FS: 2 Filed Weights   07/17/16 1136  Weight: 219 lb 9.6 oz (99.6 kg)   Right periorbital ecchymosis but sclerae unicteric, pupils round and equal, EOMs intact Oropharynx clear and moist-- no thrush or other lesions No cervical or supraclavicular adenopathy Lungs no rales or rhonchi Heart regular rate and rhythm Abd soft, obese, nontender, positive bowel sounds MSK no focal spinal tenderness, Neuro: nonfocal, well oriented, appropriate affect Breasts: Status post bilateral mastectomies. No evidence of chest wall recurrence. Both axillae are benign.    LAB RESULTS:  Lab Results  Component Value Date    WBC 8.7 07/17/2016   NEUTROABS 5.0 07/17/2016   HGB 11.8 07/17/2016   HCT 34.5 (L) 07/17/2016   MCV 99.8 07/17/2016   PLT 186 07/17/2016      Chemistry      Component Value Date/Time   NA 135 (L) 07/17/2016 1107   K 4.0 07/17/2016 1107   CL 100 10/08/2013 1246   CL 103 05/20/2013 1404   CO2 25 07/17/2016 1107   BUN 21.0 07/17/2016 1107   CREATININE 1.9 (H) 07/17/2016 1107      Component Value Date/Time   CALCIUM 9.2 07/17/2016 1107   ALKPHOS 73 07/17/2016 1107   AST 20 07/17/2016 1107  ALT 17 07/17/2016 1107   BILITOT <0.30 07/17/2016 1107       STUDIES: No results found.  ASSESSMENT: 55 y.o. 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 There were also 2 suspicious areas in the left breast noted by MRI.  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 Patient's testing pending  (5)  completed adjuvant chemotherapy, consisting of 6 cycles of docetaxel/ doxorubicin/ cyclophosphamide given on a Q. three-week basis, first dose  05/21/2013, last dose 09/03/2013   (6)  postmastectomy radiation therapy, completed 11/21/2013  (7) tamoxifen started December 2014, continued intermittently due to financial  problems  (a) letrozole started September 2016, discontinued June 2017 with metastatic recurrence  METASTATIC DISEASE: May 2017 (8) PET scan 05/08/2016 findings hypermetabolic metastases in the uterus, left pelvis, peritoneum, upper abdomen adenopathy, and bones, with ascites and peritoneal disease but no evidence of liver or lung involvement  (a) cervical biopsy 05/23/2017shows metastatic lobular breast cancer with a signet ring component,E-cadherin negative, gross cystic disease fluid protein and estrogen receptor positive.  (9) fulvestrant started 05/17/2016, with palbociclib started 05/18/2016 at 125 mg daily (21//7)  (10) right hydronephrosis noted on PET scan 05/08/2016--  (a) evaluated by urology; recommended observation  PLAN:  Katerin is tolerating the fulvestrant without any unusual side effects and she also does very well with the palbociclib.  It is not clear to me why she was not scheduled for a treatment last week and why she did not receive the monthly palbociclib supplied. I have checked with our oral chemotherapy pharmacist and they will be making sure the palbociclib is mailed. I entered fulvestrant doses for the next 6 months.  The right periorbital ecchymosis is impressive, but the exam is benign and she and her family have been very careful to monitor for any dizziness, visual changes, nausea, vomiting, or other symptoms, none of which have been the case. Accordingly we are not proceeding with a head CT at this point.  I am going to see Rosene again with her October fulvestrant dose. A few days before that she will have a repeat PET scan to assess response.    Chauncey Cruel, MD   07/17/2016

## 2016-07-17 ENCOUNTER — Encounter: Payer: Self-pay | Admitting: Pharmacist

## 2016-07-17 ENCOUNTER — Telehealth: Payer: Self-pay | Admitting: Oncology

## 2016-07-17 ENCOUNTER — Ambulatory Visit (HOSPITAL_BASED_OUTPATIENT_CLINIC_OR_DEPARTMENT_OTHER): Payer: Self-pay

## 2016-07-17 ENCOUNTER — Other Ambulatory Visit (HOSPITAL_BASED_OUTPATIENT_CLINIC_OR_DEPARTMENT_OTHER): Payer: Self-pay

## 2016-07-17 ENCOUNTER — Ambulatory Visit (HOSPITAL_BASED_OUTPATIENT_CLINIC_OR_DEPARTMENT_OTHER): Payer: Self-pay | Admitting: Oncology

## 2016-07-17 VITALS — BP 144/100 | HR 77 | Temp 98.6°F | Resp 18 | Ht 62.0 in | Wt 219.6 lb

## 2016-07-17 DIAGNOSIS — C7951 Secondary malignant neoplasm of bone: Secondary | ICD-10-CM

## 2016-07-17 DIAGNOSIS — C50411 Malignant neoplasm of upper-outer quadrant of right female breast: Secondary | ICD-10-CM

## 2016-07-17 DIAGNOSIS — C7960 Secondary malignant neoplasm of unspecified ovary: Secondary | ICD-10-CM

## 2016-07-17 DIAGNOSIS — C7989 Secondary malignant neoplasm of other specified sites: Secondary | ICD-10-CM

## 2016-07-17 DIAGNOSIS — Z5111 Encounter for antineoplastic chemotherapy: Secondary | ICD-10-CM

## 2016-07-17 DIAGNOSIS — C7982 Secondary malignant neoplasm of genital organs: Secondary | ICD-10-CM

## 2016-07-17 DIAGNOSIS — C786 Secondary malignant neoplasm of retroperitoneum and peritoneum: Secondary | ICD-10-CM

## 2016-07-17 DIAGNOSIS — Z95828 Presence of other vascular implants and grafts: Secondary | ICD-10-CM

## 2016-07-17 LAB — CBC WITH DIFFERENTIAL/PLATELET
BASO%: 3 % — ABNORMAL HIGH (ref 0.0–2.0)
Basophils Absolute: 0.3 10*3/uL — ABNORMAL HIGH (ref 0.0–0.1)
EOS ABS: 0.3 10*3/uL (ref 0.0–0.5)
EOS%: 3 % (ref 0.0–7.0)
HCT: 34.5 % — ABNORMAL LOW (ref 34.8–46.6)
HGB: 11.8 g/dL (ref 11.6–15.9)
LYMPH%: 26.3 % (ref 14.0–49.7)
MCH: 34.1 pg — ABNORMAL HIGH (ref 25.1–34.0)
MCHC: 34.2 g/dL (ref 31.5–36.0)
MCV: 99.8 fL (ref 79.5–101.0)
MONO#: 0.9 10*3/uL (ref 0.1–0.9)
MONO%: 10 % (ref 0.0–14.0)
NEUT%: 57.7 % (ref 38.4–76.8)
NEUTROS ABS: 5 10*3/uL (ref 1.5–6.5)
Platelets: 186 10*3/uL (ref 145–400)
RBC: 3.45 10*6/uL — AB (ref 3.70–5.45)
RDW: 22.4 % — ABNORMAL HIGH (ref 11.2–14.5)
WBC: 8.7 10*3/uL (ref 3.9–10.3)
lymph#: 2.3 10*3/uL (ref 0.9–3.3)

## 2016-07-17 LAB — COMPREHENSIVE METABOLIC PANEL
ALK PHOS: 73 U/L (ref 40–150)
ALT: 17 U/L (ref 0–55)
ANION GAP: 9 meq/L (ref 3–11)
AST: 20 U/L (ref 5–34)
Albumin: 3.5 g/dL (ref 3.5–5.0)
BUN: 21 mg/dL (ref 7.0–26.0)
CO2: 25 meq/L (ref 22–29)
CREATININE: 1.9 mg/dL — AB (ref 0.6–1.1)
Calcium: 9.2 mg/dL (ref 8.4–10.4)
Chloride: 101 mEq/L (ref 98–109)
EGFR: 29 mL/min/{1.73_m2} — ABNORMAL LOW (ref >90)
GLUCOSE: 106 mg/dL (ref 70–140)
Potassium: 4 mEq/L (ref 3.5–5.1)
SODIUM: 135 meq/L — AB (ref 136–145)
TOTAL PROTEIN: 7.2 g/dL (ref 6.4–8.3)

## 2016-07-17 MED ORDER — FULVESTRANT 250 MG/5ML IM SOLN
500.0000 mg | Freq: Once | INTRAMUSCULAR | Status: AC
  Administered 2016-07-17: 500 mg via INTRAMUSCULAR
  Filled 2016-07-17: qty 10

## 2016-07-17 MED ORDER — SODIUM CHLORIDE 0.9 % IJ SOLN
10.0000 mL | INTRAMUSCULAR | Status: DC | PRN
  Administered 2016-07-17: 10 mL via INTRAVENOUS
  Filled 2016-07-17: qty 10

## 2016-07-17 MED ORDER — ALPRAZOLAM 0.5 MG PO TABS: 0.5000 mg | ORAL_TABLET | Freq: Three times a day (TID) | ORAL | 3 refills | Status: AC | PRN

## 2016-07-17 MED ORDER — ESCITALOPRAM OXALATE 20 MG PO TABS: 20.0000 mg | ORAL_TABLET | Freq: Every day | ORAL | 3 refills | Status: AC

## 2016-07-17 MED ORDER — HEPARIN SOD (PORK) LOCK FLUSH 100 UNIT/ML IV SOLN
500.0000 [IU] | Freq: Once | INTRAVENOUS | Status: AC | PRN
Start: 2016-07-17 — End: 2016-07-17
  Administered 2016-07-17: 500 [IU] via INTRAVENOUS
  Filled 2016-07-17: qty 5

## 2016-07-17 MED ORDER — GABAPENTIN 100 MG PO CAPS: 100.0000 mg | ORAL_CAPSULE | Freq: Three times a day (TID) | ORAL | 3 refills | Status: DC | PRN

## 2016-07-17 MED ORDER — PALBOCICLIB 125 MG PO CAPS: 125.0000 mg | ORAL_CAPSULE | Freq: Every day | ORAL | 4 refills | Status: DC

## 2016-07-17 NOTE — Progress Notes (Signed)
Oral Chemotherapy Pharmacist Encounter   Received message from Dr. Virgie Dad nurse about the status of patient's Ibrance prescription from [facility] patient assistance. Pt had reached out to RN's stating that she had not received next cycle of Ibrance.  I called [employer] at [phone] to inquire about any issues.  They stated they had not heard from patient that it was time to refill Rx. I requested to start the refill process at that time.  I spoke with pt in the lobby, relayed information to her that medication should be shipped out today to be received tomorrow 8/22 or Wednesday 8/23 at the latest. I informed pt to call [facility] on her "off" week to ensure she gets her refills on time.  Pt expressed gratitude and understanding. MD also made aware that pt will be receiving next Ibrance fill this week.  Thank you,  Johny Drilling, PharmD, BCPS [facility]

## 2016-07-17 NOTE — Telephone Encounter (Signed)
appt made and avs printed °

## 2016-07-17 NOTE — Patient Instructions (Signed)

## 2016-07-18 LAB — CANCER ANTIGEN 27.29: CA 27.29: 911.1 U/mL — ABNORMAL HIGH (ref 0.0–38.6)

## 2016-07-18 LAB — CA 125: CANCER ANTIGEN (CA) 125: 850.3 U/mL — AB (ref 0.0–38.1)

## 2016-07-25 ENCOUNTER — Encounter: Payer: Self-pay | Admitting: Oncology

## 2016-07-25 NOTE — Progress Notes (Signed)
left in my box °

## 2016-07-26 ENCOUNTER — Encounter: Payer: Self-pay | Admitting: Oncology

## 2016-07-26 NOTE — Progress Notes (Signed)
-  left in my box-form for atty left for dr. Jana Hakim to sign

## 2016-07-28 ENCOUNTER — Encounter: Payer: Self-pay | Admitting: Oncology

## 2016-07-28 NOTE — Progress Notes (Signed)
fmla forms for mom [name] [name] left in my box

## 2016-07-28 NOTE — Progress Notes (Signed)
See prev notes. Sent to medical recds and copy for patient

## 2016-07-28 NOTE — Progress Notes (Signed)
-  left in my box-form for atty left for dr. Jana Hakim to sign-faxed [phone] atty office

## 2016-08-01 ENCOUNTER — Encounter: Payer: Self-pay | Admitting: Oncology

## 2016-08-01 NOTE — Progress Notes (Signed)
fmla forms for mom [name] [name] left in my box- forms for [name] [name] - I Left for dr Jana Hakim to sign

## 2016-08-02 ENCOUNTER — Encounter: Payer: Self-pay | Admitting: Oncology

## 2016-08-02 NOTE — Progress Notes (Signed)
fmla forms for mom [name] [name] left in my box- forms for [name] [name] - I Left for dr Jana Hakim to sign- faxed to [phone] and sent to medical recrds and mailed hard copy to patient

## 2016-08-14 ENCOUNTER — Ambulatory Visit: Payer: Self-pay

## 2016-08-14 ENCOUNTER — Other Ambulatory Visit: Payer: Self-pay

## 2016-08-16 ENCOUNTER — Ambulatory Visit (HOSPITAL_BASED_OUTPATIENT_CLINIC_OR_DEPARTMENT_OTHER): Payer: Self-pay

## 2016-08-16 ENCOUNTER — Other Ambulatory Visit (HOSPITAL_BASED_OUTPATIENT_CLINIC_OR_DEPARTMENT_OTHER): Payer: Self-pay

## 2016-08-16 VITALS — BP 145/88 | HR 78 | Temp 98.2°F | Resp 20

## 2016-08-16 DIAGNOSIS — C786 Secondary malignant neoplasm of retroperitoneum and peritoneum: Secondary | ICD-10-CM

## 2016-08-16 DIAGNOSIS — C7982 Secondary malignant neoplasm of genital organs: Secondary | ICD-10-CM

## 2016-08-16 DIAGNOSIS — C7989 Secondary malignant neoplasm of other specified sites: Secondary | ICD-10-CM

## 2016-08-16 DIAGNOSIS — C50411 Malignant neoplasm of upper-outer quadrant of right female breast: Secondary | ICD-10-CM

## 2016-08-16 DIAGNOSIS — Z95828 Presence of other vascular implants and grafts: Secondary | ICD-10-CM

## 2016-08-16 DIAGNOSIS — C7960 Secondary malignant neoplasm of unspecified ovary: Secondary | ICD-10-CM

## 2016-08-16 DIAGNOSIS — Z5111 Encounter for antineoplastic chemotherapy: Secondary | ICD-10-CM

## 2016-08-16 LAB — COMPREHENSIVE METABOLIC PANEL
ALT: 15 U/L (ref 0–55)
AST: 18 U/L (ref 5–34)
Albumin: 3.4 g/dL — ABNORMAL LOW (ref 3.5–5.0)
Alkaline Phosphatase: 79 U/L (ref 40–150)
Anion Gap: 10 mEq/L (ref 3–11)
BUN: 17.8 mg/dL (ref 7.0–26.0)
CALCIUM: 9.1 mg/dL (ref 8.4–10.4)
CHLORIDE: 102 meq/L (ref 98–109)
CO2: 24 meq/L (ref 22–29)
CREATININE: 1.9 mg/dL — AB (ref 0.6–1.1)
EGFR: 30 mL/min/{1.73_m2} — ABNORMAL LOW (ref >90)
GLUCOSE: 104 mg/dL (ref 70–140)
Potassium: 4 mEq/L (ref 3.5–5.1)
Sodium: 137 mEq/L (ref 136–145)
Total Bilirubin: 0.49 mg/dL (ref 0.20–1.20)
Total Protein: 7.2 g/dL (ref 6.4–8.3)

## 2016-08-16 LAB — CBC WITH DIFFERENTIAL/PLATELET
BASO%: 3.5 % — AB (ref 0.0–2.0)
Basophils Absolute: 0.3 10*3/uL — ABNORMAL HIGH (ref 0.0–0.1)
EOS%: 3.8 % (ref 0.0–7.0)
Eosinophils Absolute: 0.3 10*3/uL (ref 0.0–0.5)
HEMATOCRIT: 32.3 % — AB (ref 34.8–46.6)
HGB: 11.4 g/dL — ABNORMAL LOW (ref 11.6–15.9)
LYMPH#: 2.4 10*3/uL (ref 0.9–3.3)
LYMPH%: 34 % (ref 14.0–49.7)
MCH: 35.8 pg — ABNORMAL HIGH (ref 25.1–34.0)
MCHC: 35.3 g/dL (ref 31.5–36.0)
MCV: 101.6 fL — ABNORMAL HIGH (ref 79.5–101.0)
MONO#: 0.6 10*3/uL (ref 0.1–0.9)
MONO%: 8.6 % (ref 0.0–14.0)
NEUT#: 3.5 10*3/uL (ref 1.5–6.5)
NEUT%: 50.1 % (ref 38.4–76.8)
Platelets: 126 10*3/uL — ABNORMAL LOW (ref 145–400)
RBC: 3.18 10*6/uL — ABNORMAL LOW (ref 3.70–5.45)
RDW: 18.8 % — AB (ref 11.2–14.5)
WBC: 7.1 10*3/uL (ref 3.9–10.3)

## 2016-08-16 MED ORDER — SODIUM CHLORIDE 0.9 % IJ SOLN
10.0000 mL | INTRAMUSCULAR | Status: DC | PRN
  Administered 2016-08-16: 10 mL via INTRAVENOUS
  Filled 2016-08-16: qty 10

## 2016-08-16 MED ORDER — HEPARIN SOD (PORK) LOCK FLUSH 100 UNIT/ML IV SOLN
500.0000 [IU] | Freq: Once | INTRAVENOUS | Status: AC | PRN
  Administered 2016-08-16: 500 [IU] via INTRAVENOUS
  Filled 2016-08-16: qty 5

## 2016-08-16 MED ORDER — FULVESTRANT 250 MG/5ML IM SOLN
500.0000 mg | Freq: Once | INTRAMUSCULAR | Status: AC
Start: 2016-08-16 — End: 2016-08-16
  Administered 2016-08-16: 500 mg via INTRAMUSCULAR
  Filled 2016-08-16: qty 10

## 2016-08-16 NOTE — Patient Instructions (Signed)

## 2016-08-17 LAB — CANCER ANTIGEN 27.29: CAN 27.29: 990.4 U/mL — AB (ref 0.0–38.6)

## 2016-08-17 LAB — CA 125: Cancer Antigen (CA) 125: 825.5 U/mL — ABNORMAL HIGH (ref 0.0–38.1)

## 2016-08-21 ENCOUNTER — Telehealth: Payer: Self-pay | Admitting: *Deleted

## 2016-08-22 ENCOUNTER — Telehealth: Payer: Self-pay | Admitting: *Deleted

## 2016-08-22 ENCOUNTER — Other Ambulatory Visit: Payer: Self-pay | Admitting: *Deleted

## 2016-08-22 NOTE — Telephone Encounter (Signed)
This RN called pt per home/cell number and obtained identified VM.  This RN requested return call to verify pt has received Ibrance and may restart when completed 7 days offs.

## 2016-09-10 NOTE — Progress Notes (Signed)
Patient ID: [name] [name], female   DOB: [birth year], 55 y.o.   MRN: [id number] ID: [name] OB: [birth year]  MR#: [id number]  DTO#:[id number]  Patient Care Team: Morton Peters., MD as PCP - General (Unknown Physician Specialty) Meredith Pel, MD as Consulting Physician (Orthopedic Surgery) Chauncey Cruel, MD as Consulting Physician (Hematology and Oncology) Shelly Bombard, MD as Consulting Physician (Obstetrics and Gynecology) Everitt Amber, MD as Consulting Physician (Obstetrics and Gynecology) Alexis Frock, MD as Consulting Physician (Urology) Perrin Maltese, MD as Referring Physician (Internal Medicine) Alphonsa Overall, MD as Consulting Physician (General Surgery) Crissie Reese, MD as Consulting Physician (Plastic Surgery) OTHER MD:  Cleone Slim, MD  CHIEF COMPLAINT:  Hx of Bilateral Breast Cancers  CURRENT TREATMENT: fulvestrant, palbociclib  BREAST CANCER HISTORY: From the original intake note:  Janelys had not been able to have mammography for several years because of cost issues. More recently, she had noted a mass in her right breast but thought this might be MRSA. The urinary tract infection took her to her physician's office, and she brought the breast mass to the attention of [name]. She set up the patient for bilateral diagnostic mammography and right breast ultrasound at the [facility] 03/27/2013, which showed a 4 cm irregular mass in the upper outer right breast, with a 9 mm cluster of calcifications in the posterior lower right breast and a few mildly enlarged level I right axillary lymph nodes. The breast mass was palpable and ultrasound showed it to measure 4.1 cm, with some of the level I right axillary lymph nodes noted to have lost their fatty hilum.  Biopsy of both the suspicious areas in the right breast as well as one of the suspicious lymph nodes was performed 03/27/2013 and showed ([id number]) the larger breast mass to consist of an invasive  ductal carcinoma with lobular features, grade 2, estrogen and progesterone receptor both 100% positive, with an MIB-1 of 15%, and no HER-2 amplification. 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 The sampled right axillary lymph node was also positive.  Bilateral breast MRI 04/01/2013 showed the larger right breast mass to measure 5.4 cm, with a satellite nodule immediately inferior to it measuring 8 mm. 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 There were multiple enlarged right axillary lymph nodes, the largest measuring 2.8 cm. In addition, in the left breast there were 2 foci on non-masslike enhancement consistent with DCIS measuring 1.3 cm and 2.2 cm. These weren't different quadrants.  The patient's subsequent history is as noted below  INTERVAL HISTORY: [name] [name] returns today for follow up of her stage IV breast cancer, accompanied by a friend. [name] is due for her every four-week fulvestrant dose today. She is also one polyp palbociclib, which she now has regularly mailed to her home. She has 2 days short of completing her current cycle  She was scheduled for a PET scan prior to today's visit but she had to cancel for lack of transportation  REVIEW OF SYSTEMS: [name] [name] was very tearful today. It just gets to be too much for her. On the plus side she is hopeful she may finally get her disability, which would be an enormous financial help. She had a tooth extraction in the left lower jaw but she wanted me to look at. She is tired all the time. Her abdomen is bloated and  she worries there may be cancer there. She has your drainage and a runny nose. She is short of breath with activity and consult a time. She has heartburn and diarrhea. Sometimes she has abdominal pain. She bruises easily. She has multiple joint pains which makes it difficult for her to walk. She is forgetful, anxious, and  depressed. A detailed review of systems today was otherwise noncontributory  PAST MEDICAL HISTORY: Past Medical History:  Diagnosis Date  . Anxiety   . Breast cancer ([place]) 03/27/13   right  . Depression   . Headache(784.0)   . Hx of radiation therapy 10/02/13- 11/21/13   right chest wall, right supraclav/axillary region 5040 cGy 28 sessions, right chest wall mastectomy scar boost 1000 cGy 5 sessions  . Hypertension   . Shortness of breath   . Sleep apnea    cpcp    PAST SURGICAL HISTORY: Past Surgical History:  Procedure Laterality Date  . MASTECTOMY MODIFIED RADICAL Right 04/07/2013   Procedure: RIGHT MASTECTOMY MODIFIED RADICAL;  Surgeon: Shann Medal, MD;  Location: [facility];  Service: General;  Laterality: Right;  . MASTECTOMY, RADICAL Right 04/08/2013  . PORTACATH PLACEMENT Left 04/07/2013   Procedure: INSERTION PORT-A-CATH;  Surgeon: Shann Medal, MD;  Location: [facility];  Service: General;  Laterality: Left;  . SIMPLE MASTECTOMY WITH AXILLARY SENTINEL NODE BIOPSY Left 04/08/2013  . SIMPLE MASTECTOMY WITH AXILLARY SENTINEL NODE BIOPSY Left 04/07/2013   Procedure:  LEFT SMPLE MASTECTOMY WITH AXILLARY SENTINEL NODE BIOPSY;  Surgeon: Shann Medal, MD;  Location: [facility];  Service: General;  Laterality: Left;    FAMILY HISTORY Family History  Problem Relation Age of Onset  . Breast cancer Mother 36  . Breast cancer Maternal Grandmother 45  . Prostate cancer Paternal Grandfather 65   the patient's father died at age 74 in an airplane crash (he was a Engineer, maintenance (IT) unsupervised). The patient's mother is alive at age 11. Her name is [name] [name] and she works for [employer]. [name] [name] has a history of breast cancer diagnosed at age 61. She had one sister who is alive and well. [name] [name]'s mother, the patient's grandmother, was diagnosed with breast cancer at age 70. She had 2 sisters, neither with breast cancer. Kaeya herself had one brother, no sisters. There is no other history of breast or  ovary cancer in the family.  GYNECOLOGIC HISTORY:   (updated 02/03/2014)  Menarche age 77, she is GX P106, first live birth age 85. The patient was taking birth control pills until September 2013. She stopped having periods at that time, but had one in late March 2014.  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 She is now unemployed. Her husband Somya Jauregui (goes by "[name]") is disabled secondary to diabetes. They were separated for the past 2 years, but [name] recently moved back in with [place]. Kenadi lives with her mother [name] [name] and 4 poodles. The patient's daughter [name] "[name]" Benavidez is 91 and also lives with the patient.    ADVANCED DIRECTIVES: In place. The patient has named her daughter [name] as her healthcare power of attorney, bypassing her husband.  HEALTH MAINTENANCE:  (Updated 02/03/2014) Social History  Substance Use Topics  . Smoking status: Never Smoker  . Smokeless tobacco: Never Used  . Alcohol use No     Colonoscopy: Never  PAP: Possibly 2006, Not on file  Bone density: Possibly 2004, Not on file  Lipid panel: Not on file  Allergies  Allergen Reactions  . Vicodin [Hydrocodone-Acetaminophen] Anaphylaxis  . Codeine Nausea And Vomiting  . Penicillins Rash  . Percocet [Oxycodone-Acetaminophen] Itching  . Septra [Bactrim] Other (See Comments)    Heart races    Current Outpatient Prescriptions  Medication Sig Dispense Refill  . acyclovir (ZOVIRAX) 400 MG tablet Take 1 tablet (400 mg total) by mouth 2 (two) times daily. 60 tablet 5  . ALPRAZolam (XANAX) 0.5 MG tablet Take 1 tablet (0.5 mg total) by mouth 3 (three) times daily as needed for anxiety. 90 tablet 3  . aspirin 81 MG tablet Take 81 mg by mouth daily.    Marland Kitchen diltiazem 2 % GEL Apply 1 application topically 2 (two) times daily. (Patient taking differently: Apply 1 application topically as needed. ) 30 g 1  . escitalopram (LEXAPRO) 20 MG tablet Take 1 tablet  (20 mg total) by mouth daily. 90 tablet 3  . gabapentin (NEURONTIN) 100 MG capsule Take 1 capsule (100 mg total) by mouth 3 (three) times daily as needed. 90 capsule 3  . lidocaine-prilocaine (EMLA) cream Apply 1 application topically as needed. 30 g 1  . meloxicam (MOBIC) 15 MG tablet Take 15 mg by mouth daily.    . metoprolol succinate (TOPROL-XL) 25 MG 24 hr tablet Take 25 mg by mouth daily.    . miconazole (MICOTIN) 2 % powder Apply topically as needed for itching. Reported on 01/18/2016    . palbociclib (IBRANCE) 125 MG capsule Take 1 capsule (125 mg total) by mouth daily with breakfast. Take whole with food. 21 capsule 4  . triamterene-hydrochlorothiazide (MAXZIDE-25) 37.5-25 MG per tablet Take 1 tablet by mouth daily.     No current facility-administered medications for this visit.     Vitals:   09/11/16 1142  BP: (!) 146/78  Pulse: 75  Resp: 18  Temp: 98.6 F (37 C)     Body mass index is 42.03 kg/m.    ECOG FS: 2 Filed Weights   09/11/16 1142  Weight: 229 lb 12.8 oz (104.2 kg)   Sclerae unicteric, pupils round and equal Oropharynx clear and moist-- no thrush or other lesions--The area in the left lower jaw posteriorly where she had a tooth extracted appears to be healing normally  No cervical or supraclavicular adenopathy Lungs no rales or rhonchi Heart regular rate and rhythm Abd obese, soft, positive bowel sounds MSK no focal spinal tenderness Neuro: nonfocal, well oriented, appropriate affect Breasts: Status post bilateral mastectomy. There is no evidence of chest wall recurrence.   LAB RESULTS:  Lab Results  Component Value Date   WBC 8.2 09/11/2016   NEUTROABS 5.9 09/11/2016   HGB 10.6 (L) 09/11/2016   HCT 29.4 (L) 09/11/2016   MCV 100.0 09/11/2016   PLT 117 (L) 09/11/2016      Chemistry      Component Value Date/Time   NA 129 (L) 09/11/2016 1034   K 3.7 09/11/2016 1034   CL 100 10/08/2013 1246   CL 103 05/20/2013 1404   CO2 23 09/11/2016 1034    BUN 18.8 09/11/2016 1034   CREATININE 2.0 (H) 09/11/2016 1034      Component Value Date/Time   CALCIUM 8.9 09/11/2016 1034   ALKPHOS 73 09/11/2016 1034   AST 15 09/11/2016 1034   ALT 7 09/11/2016 1034   BILITOT 0.38 09/11/2016 1034       STUDIES: No results found.  ASSESSMENT: 55 y.o. 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 There were also 2 suspicious areas in the left breast noted by MRI.  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 Patient's testing pending  (5)  completed adjuvant chemotherapy, consisting of 6 cycles of docetaxel/ doxorubicin/ cyclophosphamide given on a Q. three-week basis, first dose  05/21/2013, last dose 09/03/2013   (6)  postmastectomy radiation therapy, completed 11/21/2013  (7) tamoxifen started December 2014, continued intermittently due to financial problems  (a) letrozole started September 2016, discontinued June 2017 with metastatic recurrence  METASTATIC DISEASE: May 2017 (8) PET scan 05/08/2016 findings hypermetabolic metastases in the uterus, left pelvis, peritoneum, upper abdomen adenopathy, and bones, with ascites and peritoneal disease but no evidence of liver or  lung involvement  (a) cervical biopsy 05/23/2017shows metastatic lobular breast cancer with a signet ring component,E-cadherin negative, gross cystic disease fluid protein and estrogen receptor positive.  (9) fulvestrant started 05/17/2016, with palbociclib started 05/18/2016 at 125 mg daily (21//7)  (10) right hydronephrosis noted on PET scan 05/08/2016--  (a) evaluated by urology; recommended observation  PLAN:  Ying is now a little over 3 months into her current treatment and it would be useful to find out if she is responding. I am canceling her PET scan and setting her up for CT scans, which are much easier to reschedule, since she does have difficulty getting to appointments. She will then see me again in 4 weeks, with her next zolendronate dose, to discuss results in detail, but of course I will contact her once we have the studies in hand.  I'm hopeful she will be able to obtain her disability, which would be a great help to her.  She was very tearful today. She says she does not let herself crying per her daughter. She is already on his cytology gram and alprazolam. I am hopeful once we can get her some good news thinks may start looking better for her  She knows to call for any problems that may develop before her next visit.    Chauncey Cruel, MD   09/11/2016

## 2016-09-11 ENCOUNTER — Other Ambulatory Visit (HOSPITAL_BASED_OUTPATIENT_CLINIC_OR_DEPARTMENT_OTHER): Payer: Self-pay

## 2016-09-11 ENCOUNTER — Ambulatory Visit (HOSPITAL_BASED_OUTPATIENT_CLINIC_OR_DEPARTMENT_OTHER): Payer: Self-pay

## 2016-09-11 ENCOUNTER — Ambulatory Visit (HOSPITAL_BASED_OUTPATIENT_CLINIC_OR_DEPARTMENT_OTHER): Payer: Self-pay | Admitting: Oncology

## 2016-09-11 VITALS — BP 146/78 | HR 75 | Temp 98.6°F | Resp 18 | Ht 62.0 in | Wt 229.8 lb

## 2016-09-11 DIAGNOSIS — C7989 Secondary malignant neoplasm of other specified sites: Secondary | ICD-10-CM

## 2016-09-11 DIAGNOSIS — C7951 Secondary malignant neoplasm of bone: Secondary | ICD-10-CM

## 2016-09-11 DIAGNOSIS — Z170 Estrogen receptor positive status [ER+]: Principal | ICD-10-CM

## 2016-09-11 DIAGNOSIS — C7960 Secondary malignant neoplasm of unspecified ovary: Secondary | ICD-10-CM

## 2016-09-11 DIAGNOSIS — C786 Secondary malignant neoplasm of retroperitoneum and peritoneum: Secondary | ICD-10-CM

## 2016-09-11 DIAGNOSIS — C50411 Malignant neoplasm of upper-outer quadrant of right female breast: Secondary | ICD-10-CM

## 2016-09-11 DIAGNOSIS — Z5111 Encounter for antineoplastic chemotherapy: Secondary | ICD-10-CM

## 2016-09-11 DIAGNOSIS — Z95828 Presence of other vascular implants and grafts: Secondary | ICD-10-CM

## 2016-09-11 DIAGNOSIS — C7982 Secondary malignant neoplasm of genital organs: Secondary | ICD-10-CM

## 2016-09-11 LAB — CBC WITH DIFFERENTIAL/PLATELET
BASO%: 2.4 % — AB (ref 0.0–2.0)
BASOS ABS: 0.2 10*3/uL — AB (ref 0.0–0.1)
EOS%: 3.3 % (ref 0.0–7.0)
Eosinophils Absolute: 0.3 10*3/uL (ref 0.0–0.5)
HCT: 29.4 % — ABNORMAL LOW (ref 34.8–46.6)
HGB: 10.6 g/dL — ABNORMAL LOW (ref 11.6–15.9)
LYMPH%: 19.1 % (ref 14.0–49.7)
MCH: 36.1 pg — AB (ref 25.1–34.0)
MCHC: 36.1 g/dL — ABNORMAL HIGH (ref 31.5–36.0)
MCV: 100 fL (ref 79.5–101.0)
MONO#: 0.3 10*3/uL (ref 0.1–0.9)
MONO%: 3 % (ref 0.0–14.0)
NEUT#: 5.9 10*3/uL (ref 1.5–6.5)
NEUT%: 72.2 % (ref 38.4–76.8)
Platelets: 117 10*3/uL — ABNORMAL LOW (ref 145–400)
RBC: 2.94 10*6/uL — AB (ref 3.70–5.45)
RDW: 15.5 % — AB (ref 11.2–14.5)
WBC: 8.2 10*3/uL (ref 3.9–10.3)
lymph#: 1.6 10*3/uL (ref 0.9–3.3)
nRBC: 0 % (ref 0–0)

## 2016-09-11 LAB — COMPREHENSIVE METABOLIC PANEL
ALBUMIN: 3.1 g/dL — AB (ref 3.5–5.0)
ALK PHOS: 73 U/L (ref 40–150)
ALT: 7 U/L (ref 0–55)
ANION GAP: 10 meq/L (ref 3–11)
AST: 15 U/L (ref 5–34)
BILIRUBIN TOTAL: 0.38 mg/dL (ref 0.20–1.20)
BUN: 18.8 mg/dL (ref 7.0–26.0)
CALCIUM: 8.9 mg/dL (ref 8.4–10.4)
CO2: 23 mEq/L (ref 22–29)
CREATININE: 2 mg/dL — AB (ref 0.6–1.1)
Chloride: 95 mEq/L — ABNORMAL LOW (ref 98–109)
EGFR: 28 mL/min/{1.73_m2} — ABNORMAL LOW (ref >90)
Glucose: 101 mg/dl (ref 70–140)
Potassium: 3.7 mEq/L (ref 3.5–5.1)
SODIUM: 129 meq/L — AB (ref 136–145)
TOTAL PROTEIN: 7 g/dL (ref 6.4–8.3)

## 2016-09-11 MED ORDER — FULVESTRANT 250 MG/5ML IM SOLN
500.0000 mg | Freq: Once | INTRAMUSCULAR | Status: AC
Start: 2016-09-11 — End: 2016-09-11
  Administered 2016-09-11: 500 mg via INTRAMUSCULAR
  Filled 2016-09-11: qty 10

## 2016-09-11 MED ORDER — SODIUM CHLORIDE 0.9 % IJ SOLN
10.0000 mL | INTRAMUSCULAR | Status: DC | PRN
  Administered 2016-09-11: 10 mL via INTRAVENOUS
  Filled 2016-09-11: qty 10

## 2016-09-11 MED ORDER — HEPARIN SOD (PORK) LOCK FLUSH 100 UNIT/ML IV SOLN
500.0000 [IU] | Freq: Once | INTRAVENOUS | Status: AC | PRN
  Administered 2016-09-11: 500 [IU] via INTRAVENOUS
  Filled 2016-09-11: qty 5

## 2016-09-11 NOTE — Patient Instructions (Signed)

## 2016-09-12 ENCOUNTER — Other Ambulatory Visit: Payer: Self-pay | Admitting: *Deleted

## 2016-09-12 LAB — CANCER ANTIGEN 27.29

## 2016-09-12 MED ORDER — LIDOCAINE-PRILOCAINE 2.5-2.5 % EX CREA: 1.0000 "application " | TOPICAL_CREAM | CUTANEOUS | 1 refills | Status: AC | PRN

## 2016-09-13 ENCOUNTER — Telehealth: Payer: Self-pay

## 2016-09-13 NOTE — Telephone Encounter (Signed)
Form for continued use of Ibrance faxed back to [employer] patient assistance program.

## 2016-09-19 ENCOUNTER — Ambulatory Visit (HOSPITAL_COMMUNITY): Payer: Self-pay

## 2016-09-21 ENCOUNTER — Telehealth: Payer: Self-pay | Admitting: *Deleted

## 2016-09-21 ENCOUNTER — Other Ambulatory Visit: Payer: Self-pay | Admitting: *Deleted

## 2016-09-21 DIAGNOSIS — C50411 Malignant neoplasm of upper-outer quadrant of right female breast: Secondary | ICD-10-CM

## 2016-09-21 DIAGNOSIS — Z170 Estrogen receptor positive status [ER+]: Principal | ICD-10-CM

## 2016-09-21 MED ORDER — ONDANSETRON 8 MG PO TBDP: 8.0000 mg | ORAL_TABLET | Freq: Two times a day (BID) | ORAL | 1 refills | Status: DC | PRN

## 2016-09-21 MED ORDER — PROCHLORPERAZINE MALEATE 10 MG PO TABS: 10.0000 mg | ORAL_TABLET | Freq: Four times a day (QID) | ORAL | 0 refills | Status: AC | PRN

## 2016-09-21 NOTE — Telephone Encounter (Signed)
Pt called requesting antiemetics - stated she has been vomiting for the last 2 weeks.  Had to reschedule CT scan from 10/24 to 10/31 due to pt unable to drink contrast due to nausea.  Pt is currently taking Ibrance.  Stated she is able to drink po fluids fine; has constipation but using stool softener/laxatives to help with BMs. Dr. Jana Hakim notified. Scripts for antiemetics sent to [facility] on [address] in [place]. Pt's    Phone     [phone].

## 2016-09-25 ENCOUNTER — Other Ambulatory Visit: Payer: Self-pay

## 2016-09-25 DIAGNOSIS — C50411 Malignant neoplasm of upper-outer quadrant of right female breast: Secondary | ICD-10-CM

## 2016-09-26 ENCOUNTER — Telehealth: Payer: Self-pay | Admitting: *Deleted

## 2016-09-26 ENCOUNTER — Ambulatory Visit (HOSPITAL_COMMUNITY)
Admission: RE | Admit: 2016-09-26 | Discharge: 2016-09-26 | Disposition: A | Discharge status: Home / Self Care | Payer: Medicare Other | Source: Ambulatory Visit | Attending: Oncology | Admitting: Oncology

## 2016-09-26 DIAGNOSIS — N1330 Unspecified hydronephrosis: Secondary | ICD-10-CM | POA: Insufficient documentation

## 2016-09-26 DIAGNOSIS — C7951 Secondary malignant neoplasm of bone: Secondary | ICD-10-CM | POA: Diagnosis not present

## 2016-09-26 DIAGNOSIS — R188 Other ascites: Secondary | ICD-10-CM | POA: Diagnosis not present

## 2016-09-26 DIAGNOSIS — C7982 Secondary malignant neoplasm of genital organs: Secondary | ICD-10-CM | POA: Insufficient documentation

## 2016-09-26 DIAGNOSIS — Z170 Estrogen receptor positive status [ER+]: Secondary | ICD-10-CM | POA: Insufficient documentation

## 2016-09-26 DIAGNOSIS — C786 Secondary malignant neoplasm of retroperitoneum and peritoneum: Secondary | ICD-10-CM | POA: Insufficient documentation

## 2016-09-26 DIAGNOSIS — C7989 Secondary malignant neoplasm of other specified sites: Secondary | ICD-10-CM

## 2016-09-26 DIAGNOSIS — C7960 Secondary malignant neoplasm of unspecified ovary: Secondary | ICD-10-CM | POA: Diagnosis not present

## 2016-09-26 DIAGNOSIS — C50411 Malignant neoplasm of upper-outer quadrant of right female breast: Secondary | ICD-10-CM | POA: Insufficient documentation

## 2016-09-26 NOTE — Telephone Encounter (Signed)
"  I'm having a hard time keeping the contrast down but I'm on the way."  Called CT scan department.  "Tell her don't worry about it and to come on in."

## 2016-10-03 ENCOUNTER — Other Ambulatory Visit: Payer: Self-pay | Admitting: *Deleted

## 2016-10-03 DIAGNOSIS — C7989 Secondary malignant neoplasm of other specified sites: Secondary | ICD-10-CM

## 2016-10-03 DIAGNOSIS — C50411 Malignant neoplasm of upper-outer quadrant of right female breast: Secondary | ICD-10-CM

## 2016-10-03 DIAGNOSIS — Z170 Estrogen receptor positive status [ER+]: Principal | ICD-10-CM

## 2016-10-06 ENCOUNTER — Ambulatory Visit (HOSPITAL_COMMUNITY)
Admission: RE | Admit: 2016-10-06 | Discharge: 2016-10-06 | Disposition: A | Discharge status: Home / Self Care | Payer: Medicare Other | Source: Ambulatory Visit | Attending: Oncology | Admitting: Oncology

## 2016-10-06 DIAGNOSIS — C50411 Malignant neoplasm of upper-outer quadrant of right female breast: Secondary | ICD-10-CM | POA: Insufficient documentation

## 2016-10-06 DIAGNOSIS — Z170 Estrogen receptor positive status [ER+]: Secondary | ICD-10-CM | POA: Insufficient documentation

## 2016-10-06 DIAGNOSIS — R188 Other ascites: Secondary | ICD-10-CM | POA: Diagnosis not present

## 2016-10-06 DIAGNOSIS — C7989 Secondary malignant neoplasm of other specified sites: Secondary | ICD-10-CM | POA: Insufficient documentation

## 2016-10-06 LAB — GRAM STAIN

## 2016-10-06 NOTE — Procedures (Signed)
Ultrasound-guided diagnostic and therapeutic paracentesis performed yielding 4.8  liters of yellow fluid. No immediate complications. A portion of the fluid was submitted to the lab for preordered studies.

## 2016-10-09 ENCOUNTER — Ambulatory Visit (HOSPITAL_BASED_OUTPATIENT_CLINIC_OR_DEPARTMENT_OTHER): Payer: Medicare Other | Admitting: Oncology

## 2016-10-09 ENCOUNTER — Other Ambulatory Visit (HOSPITAL_BASED_OUTPATIENT_CLINIC_OR_DEPARTMENT_OTHER): Payer: Medicare Other

## 2016-10-09 ENCOUNTER — Ambulatory Visit (HOSPITAL_BASED_OUTPATIENT_CLINIC_OR_DEPARTMENT_OTHER): Payer: Medicare Other

## 2016-10-09 VITALS — BP 136/60 | HR 62 | Temp 98.4°F | Resp 18 | Ht 62.0 in | Wt 228.1 lb

## 2016-10-09 DIAGNOSIS — Z5111 Encounter for antineoplastic chemotherapy: Secondary | ICD-10-CM

## 2016-10-09 DIAGNOSIS — C50411 Malignant neoplasm of upper-outer quadrant of right female breast: Secondary | ICD-10-CM

## 2016-10-09 DIAGNOSIS — C786 Secondary malignant neoplasm of retroperitoneum and peritoneum: Secondary | ICD-10-CM

## 2016-10-09 DIAGNOSIS — C7951 Secondary malignant neoplasm of bone: Secondary | ICD-10-CM

## 2016-10-09 DIAGNOSIS — C772 Secondary and unspecified malignant neoplasm of intra-abdominal lymph nodes: Secondary | ICD-10-CM

## 2016-10-09 DIAGNOSIS — C7989 Secondary malignant neoplasm of other specified sites: Secondary | ICD-10-CM | POA: Diagnosis not present

## 2016-10-09 DIAGNOSIS — C7982 Secondary malignant neoplasm of genital organs: Secondary | ICD-10-CM

## 2016-10-09 DIAGNOSIS — Z95828 Presence of other vascular implants and grafts: Secondary | ICD-10-CM

## 2016-10-09 DIAGNOSIS — Z170 Estrogen receptor positive status [ER+]: Principal | ICD-10-CM

## 2016-10-09 DIAGNOSIS — C7960 Secondary malignant neoplasm of unspecified ovary: Secondary | ICD-10-CM

## 2016-10-09 LAB — COMPREHENSIVE METABOLIC PANEL
ALT: 11 U/L (ref 0–55)
ANION GAP: 12 meq/L — AB (ref 3–11)
AST: 16 U/L (ref 5–34)
Albumin: 2.4 g/dL — ABNORMAL LOW (ref 3.5–5.0)
Alkaline Phosphatase: 77 U/L (ref 40–150)
BILIRUBIN TOTAL: 0.3 mg/dL (ref 0.20–1.20)
BUN: 25.7 mg/dL (ref 7.0–26.0)
CALCIUM: 8.9 mg/dL (ref 8.4–10.4)
CHLORIDE: 96 meq/L — AB (ref 98–109)
CO2: 24 mEq/L (ref 22–29)
CREATININE: 2 mg/dL — AB (ref 0.6–1.1)
EGFR: 27 mL/min/{1.73_m2} — ABNORMAL LOW (ref >90)
Glucose: 97 mg/dl (ref 70–140)
Potassium: 3.6 mEq/L (ref 3.5–5.1)
Sodium: 132 mEq/L — ABNORMAL LOW (ref 136–145)
TOTAL PROTEIN: 6.2 g/dL — AB (ref 6.4–8.3)

## 2016-10-09 LAB — CBC WITH DIFFERENTIAL/PLATELET
BASO%: 3.6 % — AB (ref 0.0–2.0)
Basophils Absolute: 0.2 10*3/uL — ABNORMAL HIGH (ref 0.0–0.1)
EOS%: 3.7 % (ref 0.0–7.0)
Eosinophils Absolute: 0.2 10*3/uL (ref 0.0–0.5)
HEMATOCRIT: 33.7 % — AB (ref 34.8–46.6)
HEMOGLOBIN: 11.3 g/dL — AB (ref 11.6–15.9)
LYMPH#: 1.7 10*3/uL (ref 0.9–3.3)
LYMPH%: 28.5 % (ref 14.0–49.7)
MCH: 35.4 pg — ABNORMAL HIGH (ref 25.1–34.0)
MCHC: 33.6 g/dL (ref 31.5–36.0)
MCV: 105.4 fL — ABNORMAL HIGH (ref 79.5–101.0)
MONO#: 0.2 10*3/uL (ref 0.1–0.9)
MONO%: 4.2 % (ref 0.0–14.0)
NEUT%: 60 % (ref 38.4–76.8)
NEUTROS ABS: 3.5 10*3/uL (ref 1.5–6.5)
PLATELETS: 133 10*3/uL — AB (ref 145–400)
RBC: 3.19 10*6/uL — ABNORMAL LOW (ref 3.70–5.45)
RDW: 15.9 % — AB (ref 11.2–14.5)
WBC: 5.9 10*3/uL (ref 3.9–10.3)

## 2016-10-09 MED ORDER — FULVESTRANT 250 MG/5ML IM SOLN
500.0000 mg | Freq: Once | INTRAMUSCULAR | Status: AC
  Administered 2016-10-09: 500 mg via INTRAMUSCULAR
  Filled 2016-10-09: qty 10

## 2016-10-09 MED ORDER — HEPARIN SOD (PORK) LOCK FLUSH 100 UNIT/ML IV SOLN
500.0000 [IU] | Freq: Once | INTRAVENOUS | Status: AC | PRN
  Administered 2016-10-09: 500 [IU] via INTRAVENOUS
  Filled 2016-10-09: qty 5

## 2016-10-09 MED ORDER — SODIUM CHLORIDE 0.9 % IJ SOLN
10.0000 mL | INTRAMUSCULAR | Status: DC | PRN
  Administered 2016-10-09: 10 mL via INTRAVENOUS
  Filled 2016-10-09: qty 10

## 2016-10-09 MED ORDER — MELOXICAM 15 MG PO TABS: 15.0000 mg | ORAL_TABLET | Freq: Every day | ORAL | 3 refills | Status: AC

## 2016-10-09 MED ORDER — ANASTROZOLE 1 MG PO TABS: 1.0000 mg | ORAL_TABLET | Freq: Every day | ORAL | 6 refills | Status: DC

## 2016-10-09 NOTE — Progress Notes (Signed)
Patient ID: [name] [name], female   DOB: [birth year], 55 y.o.   MRN: [id number] ID: [name] [name] OB: 03-22-1961  MR#: [id number]  LSL#:[id number]  Patient Care Team: Morton Peters., MD as PCP - General (Unknown Physician Specialty) Meredith Pel, MD as Consulting Physician (Orthopedic Surgery) Chauncey Cruel, MD as Consulting Physician (Hematology and Oncology) Shelly Bombard, MD as Consulting Physician (Obstetrics and Gynecology) Everitt Amber, MD as Consulting Physician (Obstetrics and Gynecology) Alexis Frock, MD as Consulting Physician (Urology) Perrin Maltese, MD as Referring Physician (Internal Medicine) Alphonsa Overall, MD as Consulting Physician (General Surgery) Crissie Reese, MD as Consulting Physician (Plastic Surgery) OTHER MD:  Cleone Slim, MD  CHIEF COMPLAINT:  Hx of Bilateral Breast Cancers  CURRENT TREATMENT: fulvestrant, palbociclib  BREAST CANCER HISTORY: From the original intake note:  [name] had not been able to have mammography for several years because of cost issues. More recently, she had noted a mass in her right breast but thought this might be MRSA. The urinary tract infection took her to her physician's office, and she brought the breast mass to the attention of [name]. She set up the patient for bilateral diagnostic mammography and right breast ultrasound at the [facility] 03/27/2013, which showed a 4 cm irregular mass in the upper outer right breast, with a 9 mm cluster of calcifications in the posterior lower right breast and a few mildly enlarged level I right axillary lymph nodes. The breast mass was palpable and ultrasound showed it to measure 4.1 cm, with some of the level I right axillary lymph nodes noted to have lost their fatty hilum.  Biopsy of both the suspicious areas in the right breast as well as one of the suspicious lymph nodes was performed 03/27/2013 and showed ([id number]) the larger breast mass to consist of an invasive  ductal carcinoma with lobular features, grade 2, estrogen and progesterone receptor both 100% positive, with an MIB-1 of 15%, and no HER-2 amplification. 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 The sampled right axillary lymph node was also positive.  Bilateral breast MRI 04/01/2013 showed the larger right breast mass to measure 5.4 cm, with a satellite nodule immediately inferior to it measuring 8 mm. 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 There were multiple enlarged right axillary lymph nodes, the largest measuring 2.8 cm. In addition, in the left breast there were 2 foci on non-masslike enhancement consistent with DCIS measuring 1.3 cm and 2.2 cm. These weren't different quadrants.  The patient's subsequent history is as noted below  INTERVAL HISTORY: [name] returns today for follow up of her estrogen receptor positive metastatic breast cancer, accompanied by a friend. (The patient's daughter was not able to come because she had to take care of the patient's husband.). [name] continues on fulvestrant, with a dose due today. She is also on palbociclib, currently on the third week of her 20 once a day cycle, after which she will be off for 1 week.  Since her last visit here she had rescheduling CT scans of the chest abdomen and pelvis as well as the brain. There was no evidence of liver, lung, or brain involvement. The measurable disease in the pelvis was stable. However ascites was significantly increased and she went ultrasound-guided paracentesis of more than 4 L of ascitic fluid 10/06/2016. She had significant relief from this procedure, able  to eat better and breathing better and generally do more.  REVIEW OF SYSTEMS: [name] is very encouraged by the improvement she experienced after paracentesis. She still describes herself is severely fatigued. She aches everywhere. She has some ear  pain. She is short of breath even at rest. She keeps a dry cough. She is severely constipated. She has stress urinary incontinence. Of course she continues to have back and joint pain and is forgetful, anxious and depressed. On the plus side apparently her disability came through and she at least has some financial support at this point. A detailed review of systems was otherwise stable  PAST MEDICAL HISTORY: Past Medical History:  Diagnosis Date  . Anxiety   . Breast cancer ([facility]) 03/27/13   right  . Depression   . Headache(784.0)   . Hx of radiation therapy 10/02/13- 11/21/13   right chest wall, right supraclav/axillary region 5040 cGy 28 sessions, right chest wall mastectomy scar boost 1000 cGy 5 sessions  . Hypertension   . Shortness of breath   . Sleep apnea    cpcp    PAST SURGICAL HISTORY: Past Surgical History:  Procedure Laterality Date  . MASTECTOMY MODIFIED RADICAL Right 04/07/2013   Procedure: RIGHT MASTECTOMY MODIFIED RADICAL;  Surgeon: Shann Medal, MD;  Location: [facility];  Service: General;  Laterality: Right;  . MASTECTOMY, RADICAL Right 04/08/2013  . PORTACATH PLACEMENT Left 04/07/2013   Procedure: INSERTION PORT-A-CATH;  Surgeon: Shann Medal, MD;  Location: [facility];  Service: General;  Laterality: Left;  . SIMPLE MASTECTOMY WITH AXILLARY SENTINEL NODE BIOPSY Left 04/08/2013  . SIMPLE MASTECTOMY WITH AXILLARY SENTINEL NODE BIOPSY Left 04/07/2013   Procedure:  LEFT SMPLE MASTECTOMY WITH AXILLARY SENTINEL NODE BIOPSY;  Surgeon: Shann Medal, MD;  Location: [facility];  Service: General;  Laterality: Left;    FAMILY HISTORY Family History  Problem Relation Age of Onset  . Breast cancer Mother 40  . Breast cancer Maternal Grandmother 3  . Prostate cancer Paternal Grandfather 52   the patient's father died at age 47 in an airplane crash (he was a Engineer, maintenance (IT) unsupervised). The patient's mother is alive at age 37. Her name is [name] [name] and she works for [employer].  [name] [name] has a history of breast cancer diagnosed at age 62. She had one sister who is alive and well. [name]'s mother, the patient's grandmother, was diagnosed with breast cancer at age 58. She had 2 sisters, neither with breast cancer. Udell herself had one brother, no sisters. There is no other history of breast or ovary cancer in the family.  GYNECOLOGIC HISTORY:   (updated 02/03/2014)  Menarche age 54, she is GX P67, first live birth age 42. The patient was taking birth control pills until September 2013. She stopped having periods at that time, but had one in late March 2014.  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 She is now unemployed. Her husband Kiaira Pointer (goes by "[name]") is disabled secondary to diabetes. They were separated for the past 2 years, but [name] recently moved back in with [place]. Chihiro lives with her mother [name] [name] and 67 poodles. The patient's daughter [name] "[name]" Horstman is 31 and also lives with the patient.    ADVANCED DIRECTIVES: In place. The patient has named her daughter [name] as her healthcare power of attorney, bypassing her husband.  HEALTH MAINTENANCE:  (Updated 02/03/2014) Social History  Substance Use Topics  . Smoking status: Never  Smoker  . Smokeless tobacco: Never Used  . Alcohol use No     Colonoscopy: Never  PAP: Possibly 2006, Not on file  Bone density: Possibly 2004, Not on file  Lipid panel: Not on file  Allergies  Allergen Reactions  . Vicodin [Hydrocodone-Acetaminophen] Anaphylaxis  . Codeine Nausea And Vomiting  . Penicillins Rash  . Percocet [Oxycodone-Acetaminophen] Itching  . Septra [Bactrim] Other (See Comments)    Heart races    Current Outpatient Prescriptions  Medication Sig Dispense Refill  . acyclovir (ZOVIRAX) 400 MG tablet Take 1 tablet (400 mg total) by mouth 2 (two) times daily. 60 tablet 5  . ALPRAZolam (XANAX) 0.5 MG tablet Take 1 tablet (0.5 mg total) by mouth  3 (three) times daily as needed for anxiety. 90 tablet 3  . anastrozole (ARIMIDEX) 1 MG tablet Take 1 tablet (1 mg total) by mouth daily. 90 tablet 6  . aspirin 81 MG tablet Take 81 mg by mouth daily.    Marland Kitchen diltiazem 2 % GEL Apply 1 application topically 2 (two) times daily. (Patient taking differently: Apply 1 application topically as needed. ) 30 g 1  . escitalopram (LEXAPRO) 20 MG tablet Take 1 tablet (20 mg total) by mouth daily. 90 tablet 3  . lidocaine-prilocaine (EMLA) cream Apply 1 application topically as needed. 30 g 1  . meloxicam (MOBIC) 15 MG tablet Take 1 tablet (15 mg total) by mouth daily. 30 tablet 3  . metoprolol succinate (TOPROL-XL) 25 MG 24 hr tablet Take 25 mg by mouth daily.    . miconazole (MICOTIN) 2 % powder Apply topically as needed for itching. Reported on 01/18/2016    . ondansetron (ZOFRAN ODT) 8 MG disintegrating tablet Take 1 tablet (8 mg total) by mouth every 12 (twelve) hours as needed for nausea or vomiting. Put on the tongue to dissolve. 30 tablet 1  . palbociclib (IBRANCE) 125 MG capsule Take 1 capsule (125 mg total) by mouth daily with breakfast. Take whole with food. 21 capsule 4  . prochlorperazine (COMPAZINE) 10 MG tablet Take 1 tablet (10 mg total) by mouth every 6 (six) hours as needed for nausea or vomiting. Can cause drowsiness. 30 tablet 0  . triamterene-hydrochlorothiazide (MAXZIDE-25) 37.5-25 MG per tablet Take 1 tablet by mouth daily.     No current facility-administered medications for this visit.     Vitals:   10/09/16 1256  BP: 136/60  Pulse: 62  Resp: 18  Temp: 98.4 F (36.9 C)     Body mass index is 41.72 kg/m.    ECOG FS: 2 Filed Weights   10/09/16 1256  Weight: 228 lb 1.6 oz (103.5 kg)   Middle-aged white woman examined in a wheelchairor Sclerae unicteric, EOMs intact Oropharynx clear, dentition in poor No cervical or supraclavicular adenopathy Lungs no rales or rhonchi Heart regular rate and rhythm Abd soft, obese, nontender,  positive bowel sounds MSK no focal spinal tenderness Neuro: nonfocal, well oriented, appropriate affect Breasts: Status post bilateral mastectomies. There is no evidence of chest wall recurrence.    LAB RESULTS:  Lab Results  Component Value Date   WBC 5.9 10/09/2016   NEUTROABS 3.5 10/09/2016   HGB 11.3 (L) 10/09/2016   HCT 33.7 (L) 10/09/2016   MCV 105.4 (H) 10/09/2016   PLT 133 (L) 10/09/2016      Chemistry      Component Value Date/Time   NA 132 (L) 10/09/2016 1227   K 3.6 10/09/2016 1227   CL 100 10/08/2013 1246  CL 103 05/20/2013 1404   CO2 24 10/09/2016 1227   BUN 25.7 10/09/2016 1227   CREATININE 2.0 (H) 10/09/2016 1227      Component Value Date/Time   CALCIUM 8.9 10/09/2016 1227   ALKPHOS 77 10/09/2016 1227   AST 16 10/09/2016 1227   ALT 11 10/09/2016 1227   BILITOT 0.30 10/09/2016 1227       STUDIES: Ct Abdomen Pelvis Wo Contrast  Result Date: 09/26/2016 CLINICAL DATA:  Breast cancer with metastasis. Bone metastasis, ascites. Renal insufficiency. EXAM: CT CHEST, ABDOMEN AND PELVIS WITHOUT CONTRAST TECHNIQUE: Multidetector CT imaging of the chest, abdomen and pelvis was performed following the standard protocol without IV contrast. COMPARISON:  PET-CT 05/08/2016 FINDINGS: CT CHEST FINDINGS Cardiovascular: Aortic calcifications.  No pericardial fluid. Mediastinum/Nodes: No axillary adenopathy. Lymphadenectomy clips in the RIGHT axilla post mastectomy anatomy. LEFT chest wall port . No mediastinal hilar adenopathy and noncontrast Lungs/Pleura: No suspicious pulmonary nodularity. Mild pleural thickening at lung bases. Musculoskeletal: No aggressive osseous lesion CT ABDOMEN PELVIS FINDINGS Hepatobiliary: No focal hepatic lesions noncontrast exam. Liver contour is smooth. Large volume ascites expands the peritoneal space. Gallbladder collapsed. Pancreas: Pancreatic parenchyma appears normal. No duct dilatation or mass. Spleen: Normal spleen Adrenals/Urinary Tract:  Adrenal glands are normal. Chronic hydronephrosis of the RIGHT kidney. Ureters appear normal. Bladder is difficult to define background of large volume ascites. Stomach/Bowel: Stomach, and duodenum, small-bowel cecum normal. Colon rectosigmoid colon are normal. No evidence of bowel obstruction. Vascular/Lymphatic: Abdominal aortic normal caliber. No retroperitoneal periportal lymphadenopathy. No pelvic lymphadenopathy. Reproductive: Uterus uterus grossly normal measuring 7.3 by 4.9 cm. Anterior to the uterus, there is a masses noted within the peritoneal ascites measuring 10.1 by 8.5 cm. This compares to 11.5 x 8.6 cm for no significant change. Other: No omental nodularity evident Musculoskeletal: Hypermetabolic skeletal metastasis seen on comparison exam are not well demonstrated seat IMPRESSION: Chest Impression: 1. No evidence of thoracic metastasis. 2. Coronary calcifications Abdomen / Pelvis Impression: 1. Large volume ascites expands peritoneal space and increased from comparison exam. 2. Dominant mass anterior to the uterus is not significantly changed from comparison exam. 3. Chronic RIGHT hydronephrosis without obstructing lesion identified. 4. Skeletal metastasis not well appreciated on CT . Electronically Signed   By: Suzy Bouchard M.D.   On: 09/26/2016 17:04   Ct Head Wo Contrast  Result Date: 09/26/2016 CLINICAL DATA:  Metastatic breast cancer.  Restaging. EXAM: CT HEAD WITHOUT CONTRAST TECHNIQUE: Contiguous axial images were obtained from the base of the skull through the vertex without intravenous contrast. COMPARISON:  None. FINDINGS: Brain: There is no evidence of acute cortical infarct, intracranial hemorrhage, mass, midline shift, or extra-axial fluid collection. Ventricles and sulci are normal. Mild chronic small vessel ischemic disease suspected in the cerebral white matter. Vascular: Mild calcified atherosclerosis involving the carotid siphons. Skull: No fracture or suspicious osseous  lesion. Sinuses/Orbits: Visualized paranasal sinuses are clear. Small to moderate right and small left mastoid effusions. Unremarkable orbits. Other: None. IMPRESSION: No evidence of acute intracranial abnormality or metastatic disease within limitations of unenhanced technique. Electronically Signed   By: Logan Bores M.D.   On: 09/26/2016 14:56   Ct Chest Wo Contrast  Result Date: 09/26/2016 CLINICAL DATA:  Breast cancer with metastasis. Bone metastasis, ascites. Renal insufficiency. EXAM: CT CHEST, ABDOMEN AND PELVIS WITHOUT CONTRAST TECHNIQUE: Multidetector CT imaging of the chest, abdomen and pelvis was performed following the standard protocol without IV contrast. COMPARISON:  PET-CT 05/08/2016 FINDINGS: CT CHEST FINDINGS Cardiovascular: Aortic calcifications.  No pericardial  fluid. Mediastinum/Nodes: No axillary adenopathy. Lymphadenectomy clips in the RIGHT axilla post mastectomy anatomy. LEFT chest wall port . No mediastinal hilar adenopathy and noncontrast Lungs/Pleura: No suspicious pulmonary nodularity. Mild pleural thickening at lung bases. Musculoskeletal: No aggressive osseous lesion CT ABDOMEN PELVIS FINDINGS Hepatobiliary: No focal hepatic lesions noncontrast exam. Liver contour is smooth. Large volume ascites expands the peritoneal space. Gallbladder collapsed. Pancreas: Pancreatic parenchyma appears normal. No duct dilatation or mass. Spleen: Normal spleen Adrenals/Urinary Tract: Adrenal glands are normal. Chronic hydronephrosis of the RIGHT kidney. Ureters appear normal. Bladder is difficult to define background of large volume ascites. Stomach/Bowel: Stomach, and duodenum, small-bowel cecum normal. Colon rectosigmoid colon are normal. No evidence of bowel obstruction. Vascular/Lymphatic: Abdominal aortic normal caliber. No retroperitoneal periportal lymphadenopathy. No pelvic lymphadenopathy. Reproductive: Uterus uterus grossly normal measuring 7.3 by 4.9 cm. Anterior to the uterus, there  is a masses noted within the peritoneal ascites measuring 10.1 by 8.5 cm. This compares to 11.5 x 8.6 cm for no significant change. Other: No omental nodularity evident Musculoskeletal: Hypermetabolic skeletal metastasis seen on comparison exam are not well demonstrated seat IMPRESSION: Chest Impression: 1. No evidence of thoracic metastasis. 2. Coronary calcifications Abdomen / Pelvis Impression: 1. Large volume ascites expands peritoneal space and increased from comparison exam. 2. Dominant mass anterior to the uterus is not significantly changed from comparison exam. 3. Chronic RIGHT hydronephrosis without obstructing lesion identified. 4. Skeletal metastasis not well appreciated on CT . Electronically Signed   By: Suzy Bouchard M.D.   On: 09/26/2016 17:04   US Paracentesis  Result Date: 10/06/2016 INDICATION: Metastatic breast cancer, ascites. Request made for diagnostic and therapeutic paracentesis. EXAM: ULTRASOUND GUIDED DIAGNOSTIC AND THERAPEUTIC PARACENTESIS MEDICATIONS: None. COMPLICATIONS: None immediate. PROCEDURE: Informed written consent was obtained from the patient after a discussion of the risks, benefits and alternatives to treatment. A timeout was performed prior to the initiation of the procedure. Initial ultrasound scanning demonstrates a large amount of ascites within the left lower abdominal quadrant. The left lower abdomen was prepped and draped in the usual sterile fashion. 1% lidocaine was used for local anesthesia. Following this, a Yueh catheter was introduced. An ultrasound image was saved for documentation purposes. The paracentesis was performed. The catheter was removed and a dressing was applied. The patient tolerated the procedure well without immediate post procedural complication. FINDINGS: A total of approximately 4.8 liters of yellow fluid was removed. Samples were sent to the laboratory as requested by the clinical team. IMPRESSION: Successful ultrasound-guided  diagnostic and therapeutic paracentesis yielding 4.8 liters of peritoneal fluid. Read by: Rowe Robert, PA-C Electronically Signed   By: Jerilynn Mages.  Shick M.D.   On: 10/06/2016 14:38    ASSESSMENT: 55 y.o. 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 There were also 2 suspicious areas in the left breast noted by MRI.  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 Patient's testing pending  (5)  completed adjuvant chemotherapy, consisting of 6 cycles of docetaxel/ doxorubicin/ cyclophosphamide given on a Q. three-week basis, first dose  05/21/2013, last dose 09/03/2013   (6)  postmastectomy radiation therapy, completed 11/21/2013  (7) tamoxifen started December 2014, continued intermittently due to financial problems  (a) letrozole started September 2016, discontinued June 2017 with metastatic recurrence  METASTATIC DISEASE: May 2017 (8) PET scan 05/08/2016 findings hypermetabolic metastases in the uterus, left pelvis, peritoneum, upper abdomen adenopathy, and bones, with ascites and peritoneal disease but no evidence of liver or lung involvement  (a) cervical biopsy 05/23/2017shows metastatic lobular breast cancer with a signet  ring component,E-cadherin negative, gross cystic disease fluid protein and estrogen receptor positive.  (9) fulvestrant started 05/17/2016, with palbociclib started 05/18/2016 at 125 mg daily (21//7)  (a) anastrozole added 10/09/2016   (10) right hydronephrosis noted on PET scan 05/08/2016--  (a) evaluated by urology; recommended observation  PLAN:  Rozelle is tolerating the fulvestrant well, and the measurable disease is stable.  Clinically she had some deterioration in that she had increased ascites. I am scheduling her for a repeat paracentesis 10/17/2016 in preparation for the upcoming holidays.  More importantly I have asked her to try to eat no salt. This is very difficult because they are currently not cooking at home but, bringing in all the food from send out. She understands if she is not able to control her salt intake paracentesis will become more frequent and she may need a drain placed so that she can do her own drainage at home.  We also discussed her weighing herself every morning and bringing those numbers when she returns to see me.   Finally, while we can argue that her cancer is relatively stable on the current treatment, we need to intensify therapy in some way and the simplest one I can think of is to add anastrozole. We are doing that today. We discussed the possible toxicities side effects and complications of this agent which however is generally well-tolerated.  She wishes to change her treatment days to Tuesdays so her next fulvestrant dose will be December 12. She will see me that same day. She knows to call for any problems that may develop before that visit.  Chauncey Cruel, MD   10/09/2016

## 2016-10-09 NOTE — Patient Instructions (Signed)

## 2016-10-10 LAB — CANCER ANTIGEN 27.29: CA 27.29: 1440.3 U/mL — ABNORMAL HIGH (ref 0.0–38.6)

## 2016-10-11 ENCOUNTER — Other Ambulatory Visit: Payer: Self-pay | Admitting: *Deleted

## 2016-10-11 LAB — CULTURE, BODY FLUID-BOTTLE: CULTURE: NO GROWTH

## 2016-10-11 LAB — CULTURE, BODY FLUID W GRAM STAIN -BOTTLE

## 2016-10-16 ENCOUNTER — Encounter: Payer: Self-pay | Admitting: Oncology

## 2016-10-16 NOTE — Progress Notes (Signed)
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  Pt didn't complete her portion or sign the application.  Requested she return my call.

## 2016-10-17 ENCOUNTER — Ambulatory Visit (HOSPITAL_COMMUNITY): Payer: Medicare Other

## 2016-10-17 ENCOUNTER — Ambulatory Visit (HOSPITAL_BASED_OUTPATIENT_CLINIC_OR_DEPARTMENT_OTHER): Payer: Medicare Other | Admitting: Nurse Practitioner

## 2016-10-17 ENCOUNTER — Encounter: Payer: Self-pay | Admitting: Oncology

## 2016-10-17 ENCOUNTER — Ambulatory Visit (HOSPITAL_BASED_OUTPATIENT_CLINIC_OR_DEPARTMENT_OTHER): Payer: Medicare Other

## 2016-10-17 ENCOUNTER — Other Ambulatory Visit: Payer: Self-pay | Admitting: *Deleted

## 2016-10-17 ENCOUNTER — Encounter: Payer: Self-pay | Admitting: Nurse Practitioner

## 2016-10-17 ENCOUNTER — Telehealth: Payer: Self-pay | Admitting: *Deleted

## 2016-10-17 VITALS — BP 146/94 | HR 76 | Temp 98.8°F | Resp 17 | Ht 62.0 in

## 2016-10-17 DIAGNOSIS — C50411 Malignant neoplasm of upper-outer quadrant of right female breast: Secondary | ICD-10-CM | POA: Diagnosis present

## 2016-10-17 DIAGNOSIS — R197 Diarrhea, unspecified: Secondary | ICD-10-CM

## 2016-10-17 DIAGNOSIS — E860 Dehydration: Secondary | ICD-10-CM

## 2016-10-17 DIAGNOSIS — Z170 Estrogen receptor positive status [ER+]: Principal | ICD-10-CM

## 2016-10-17 DIAGNOSIS — E46 Unspecified protein-calorie malnutrition: Secondary | ICD-10-CM | POA: Insufficient documentation

## 2016-10-17 DIAGNOSIS — E8809 Other disorders of plasma-protein metabolism, not elsewhere classified: Secondary | ICD-10-CM

## 2016-10-17 DIAGNOSIS — C7960 Secondary malignant neoplasm of unspecified ovary: Secondary | ICD-10-CM | POA: Diagnosis not present

## 2016-10-17 DIAGNOSIS — R180 Malignant ascites: Secondary | ICD-10-CM | POA: Diagnosis not present

## 2016-10-17 DIAGNOSIS — K602 Anal fissure, unspecified: Secondary | ICD-10-CM

## 2016-10-17 DIAGNOSIS — R188 Other ascites: Secondary | ICD-10-CM | POA: Insufficient documentation

## 2016-10-17 LAB — COMPREHENSIVE METABOLIC PANEL
ALBUMIN: 2.6 g/dL — AB (ref 3.5–5.0)
ALK PHOS: 84 U/L (ref 40–150)
ALT: 10 U/L (ref 0–55)
ANION GAP: 12 meq/L — AB (ref 3–11)
AST: 19 U/L (ref 5–34)
BILIRUBIN TOTAL: 0.45 mg/dL (ref 0.20–1.20)
BUN: 28.4 mg/dL — ABNORMAL HIGH (ref 7.0–26.0)
CO2: 24 meq/L (ref 22–29)
Calcium: 9.1 mg/dL (ref 8.4–10.4)
Chloride: 93 mEq/L — ABNORMAL LOW (ref 98–109)
Creatinine: 2.2 mg/dL — ABNORMAL HIGH (ref 0.6–1.1)
EGFR: 25 mL/min/{1.73_m2} — AB (ref >90)
Glucose: 91 mg/dl (ref 70–140)
POTASSIUM: 3.9 meq/L (ref 3.5–5.1)
Sodium: 130 mEq/L — ABNORMAL LOW (ref 136–145)
TOTAL PROTEIN: 6.5 g/dL (ref 6.4–8.3)

## 2016-10-17 LAB — CBC WITH DIFFERENTIAL/PLATELET
BASO%: 1.7 % (ref 0.0–2.0)
Basophils Absolute: 0.1 10*3/uL (ref 0.0–0.1)
EOS%: 1.5 % (ref 0.0–7.0)
Eosinophils Absolute: 0.1 10*3/uL (ref 0.0–0.5)
HEMATOCRIT: 32.9 % — AB (ref 34.8–46.6)
HEMOGLOBIN: 11.4 g/dL — AB (ref 11.6–15.9)
LYMPH#: 1.8 10*3/uL (ref 0.9–3.3)
LYMPH%: 21.8 % (ref 14.0–49.7)
MCH: 35.4 pg — ABNORMAL HIGH (ref 25.1–34.0)
MCHC: 34.7 g/dL (ref 31.5–36.0)
MCV: 102.2 fL — ABNORMAL HIGH (ref 79.5–101.0)
MONO#: 0.4 10*3/uL (ref 0.1–0.9)
MONO%: 4.2 % (ref 0.0–14.0)
NEUT%: 70.8 % (ref 38.4–76.8)
NEUTROS ABS: 5.9 10*3/uL (ref 1.5–6.5)
Platelets: 192 10*3/uL (ref 145–400)
RBC: 3.22 10*6/uL — ABNORMAL LOW (ref 3.70–5.45)
RDW: 14.8 % — AB (ref 11.2–14.5)
WBC: 8.4 10*3/uL (ref 3.9–10.3)

## 2016-10-17 LAB — MAGNESIUM: Magnesium: 1.8 mg/dl (ref 1.5–2.5)

## 2016-10-17 MED ORDER — SODIUM CHLORIDE 0.9 % IV SOLN
INTRAVENOUS | Status: DC
  Administered 2016-10-17: 16:00:00 via INTRAVENOUS

## 2016-10-17 MED ORDER — DIPHENOXYLATE-ATROPINE 2.5-0.025 MG PO TABS: 2.0000 | ORAL_TABLET | Freq: Four times a day (QID) | ORAL | 0 refills | Status: DC | PRN

## 2016-10-17 NOTE — Assessment & Plan Note (Signed)
Patient has a history of a chronic rectal fissure/tear.  She states that having the diarrhea has aggravated the rectal tear.  Patient states that she has been too fatigued/week to take either a shower or bath recently; and is requesting a prescription for a shower chair.  Patient was encouraged to take a shower or bath and to keep the rectal fissure area clean to avoid infection.  She was given a prescription for a shower chair today.  This provider then called and gave the printed information to the patient and her family regarding to home medical supply stores that will be open tomorrow, so patient can obtain a shower chair.

## 2016-10-17 NOTE — Progress Notes (Signed)
Per rep from [employer], Lidocaine-Prilocaine is not covered under their benefits.

## 2016-10-17 NOTE — Assessment & Plan Note (Signed)
States that she's been having diarrhea for the past 6 days.  She's been taking intermittent Imodium with minimal effectiveness.  Patient appears mildly dehydrated today, but vital signs are stable.  Patient is also afebrile.  Long discussion with both patient and her family regarding the use of Lomotil alternating with Imodium.  Patient was given a Lomotil prescription today.  Also, patient will be given a stool for C. difficile kit to take home and return to the lab tomorrow for evaluation.  Patient was also advised she may need to consider a clear liquid diet until she is better able to tolerate a bland diet, and her diarrhea clears.

## 2016-10-17 NOTE — Assessment & Plan Note (Signed)
Patient has malignant ovarian metastasis; and receives chronic paracentesis therapeutically.  Her last paracentesis was approximately 2 weeks ago where they removed 4.8 L of fluid.  Patient presented to the [facility] today with ascites.  Once again.  She stated that she feels very full and bloated.  Patient states that she was scheduled for paracentesis today; but felt too bad to come to the paracentesis appointment today.  She was rescheduled to the paracentesis for 20-Nov-2016.  Given patient's pronounced ascites today-this provider was able to obtain a paracentesis appointment in interventional radiology for tomorrow 10/18/2016 at 11:15 AM.  Reviewed all with Dr. Jana Hakim; and he recommended the patient has no maximum amount of fluid removed during her paracentesis tomorrow.  This provider will call the interventional radiology physician assistant to inform him of the removal of the maximum limit of abdominal fluid.  Also, this provider encouraged both the patient and her family to make sure that they make the paracentesis appointment that is scheduled for tomorrow.

## 2016-10-17 NOTE — Assessment & Plan Note (Addendum)
Patient's been having diarrhea for the past 6 days and admits that she's had minimal appetite and very poor oral intake.  Sodium was decreased to 130 and creatinine was increased to 2.2.  Patient was given approximately 750 ML's normal saline IV fluid rehydration while at the cancer Center today.

## 2016-10-17 NOTE — Progress Notes (Signed)
SYMPTOM MANAGEMENT CLINIC    Chief Complaint: Dehydration, diarrhea, ascites.  HPI:  [name] [name] 55 y.o. female diagnosed with breast cancer with ovarian metastasis.  Currently undergoing anastrozole and Ibrance oral therapy.    No history exists.    Review of Systems  Constitutional: Positive for malaise/fatigue.  Gastrointestinal: Positive for diarrhea.  Neurological: Positive for weakness.  All other systems reviewed and are negative.   Past Medical History:  Diagnosis Date  . Anxiety   . Breast cancer ([place]) 03/27/13   right  . Depression   . Headache(784.0)   . Hx of radiation therapy 10/02/13- 11/21/13   right chest wall, right supraclav/axillary region 5040 cGy 28 sessions, right chest wall mastectomy scar boost 1000 cGy 5 sessions  . Hypertension   . Shortness of breath   . Sleep apnea    cpcp    Past Surgical History:  Procedure Laterality Date  . MASTECTOMY MODIFIED RADICAL Right 04/07/2013   Procedure: RIGHT MASTECTOMY MODIFIED RADICAL;  Surgeon: Shann Medal, MD;  Location: [facility];  Service: General;  Laterality: Right;  . MASTECTOMY, RADICAL Right 04/08/2013  . PORTACATH PLACEMENT Left 04/07/2013   Procedure: INSERTION PORT-A-CATH;  Surgeon: Shann Medal, MD;  Location: [facility];  Service: General;  Laterality: Left;  . SIMPLE MASTECTOMY WITH AXILLARY SENTINEL NODE BIOPSY Left 04/08/2013  . SIMPLE MASTECTOMY WITH AXILLARY SENTINEL NODE BIOPSY Left 04/07/2013   Procedure:  LEFT SMPLE MASTECTOMY WITH AXILLARY SENTINEL NODE BIOPSY;  Surgeon: Shann Medal, MD;  Location: [facility];  Service: General;  Laterality: Left;    has Obstructive sleep apnea; Allergic reaction; Leucocytosis; ARF (acute renal failure) ([place]); HTN (hypertension); Rectal fissure; Anemia, unspecified; Breast cancer of upper-outer quadrant of right female breast ([place]); Elevated serum creatinine; Elevated glucose; History of breast cancer; Drainage from ear, left; Genetic testing; Port catheter in  place; 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    is allergic to vicodin [hydrocodone-acetaminophen]; codeine; penicillins; percocet [oxycodone-acetaminophen]; and septra [bactrim].    Medication List       Accurate as of 10/17/16  6:08 PM. Always use your most recent med list.          acyclovir 400 MG tablet Commonly known as:  ZOVIRAX Take 1 tablet (400 mg total) by mouth 2 (two) times daily.   ALPRAZolam 0.5 MG tablet Commonly known as:  XANAX Take 1 tablet (0.5 mg total) by mouth 3 (three) times daily as needed for anxiety.   anastrozole 1 MG tablet Commonly known as:  ARIMIDEX Take 1 tablet (1 mg total) by mouth daily.   aspirin 81 MG tablet Take 81 mg by mouth daily.   diltiazem 2 % Gel Apply 1 application topically 2 (two) times daily.   diphenoxylate-atropine 2.5-0.025 MG tablet Commonly known as:  LOMOTIL Take 2 tablets by mouth 4 (four) times daily as needed for diarrhea or loose stools.   escitalopram 20 MG tablet Commonly known as:  LEXAPRO Take 1 tablet (20 mg total) by mouth daily.   lidocaine-prilocaine cream Commonly known as:  EMLA Apply 1 application topically as needed.   meloxicam 15 MG tablet Commonly known as:  MOBIC Take 1 tablet (15 mg total) by mouth  daily.   metoprolol succinate 25 MG 24 hr tablet Commonly known as:  TOPROL-XL Take 25 mg by mouth daily.   miconazole 2 % powder Commonly known as:  MICOTIN Apply topically as needed for itching. Reported on 01/18/2016   ondansetron 8 MG disintegrating tablet Commonly known as:  ZOFRAN ODT Take 1 tablet (8 mg total)  by mouth every 12 (twelve) hours as needed for nausea or vomiting. Put on the tongue to dissolve.   palbociclib 125 MG capsule Commonly known as:  IBRANCE Take 1 capsule (125 mg total) by mouth daily with breakfast. Take whole with food.   prochlorperazine 10 MG tablet Commonly known as:  COMPAZINE Take 1 tablet (10 mg total) by mouth every 6 (six) hours as needed for nausea or vomiting. Can cause drowsiness.   triamterene-hydrochlorothiazide 37.5-25 MG tablet Commonly known as:  MAXZIDE-25 Take 1 tablet by mouth daily.        PHYSICAL EXAMINATION  Oncology Vitals 10/17/2016 10/09/2016  Height 158 cm 158 cm  Weight - 103.465 kg  Weight (lbs) - 228 lbs 2 oz  BMI (kg/m2) - 41.72 kg/m2  Temp 98.8 98.4  Pulse 76 62  Resp 17 18  SpO2 99 100  BSA (m2) - 2.13 m2   BP Readings from Last 2 Encounters:  10/17/16 (!) 146/94  10/09/16 136/60    Physical Exam  Constitutional: She is oriented to person, place, and time and well-developed, well-nourished, and in no distress.  HENT:  Head: Normocephalic and atraumatic.  Mouth/Throat: Oropharynx is clear and moist.  Eyes: Conjunctivae and EOM are normal. Pupils are equal, round, and reactive to light. Right eye exhibits no discharge. Left eye exhibits no discharge. No scleral icterus.  Neck: Normal range of motion. Neck supple. No JVD present. No tracheal deviation present. No thyromegaly present.  Cardiovascular: Normal rate, regular rhythm, normal heart sounds and intact distal pulses.   Pulmonary/Chest: Effort normal and breath sounds normal. No respiratory distress. She has no wheezes. She has no rales. She exhibits no tenderness.  Abdominal: Soft. Bowel sounds are normal. She exhibits distension. She exhibits no mass. There is no tenderness. There is no rebound and no guarding.  Significant ascites noted.  Genitourinary:  Genitourinary Comments: No rectal exam performed.  Musculoskeletal: Normal range of motion. She exhibits no  edema or tenderness.  Lymphadenopathy:    She has no cervical adenopathy.  Neurological: She is alert and oriented to person, place, and time. Gait normal.  Skin: Skin is warm and dry. No rash noted. No erythema. There is pallor.  Psychiatric: Affect normal.  Nursing note and vitals reviewed.   LABORATORY DATA:. Appointment on 10/17/2016  Component Date Value Ref Range Status  . Sodium 10/17/2016 130* 136 - 145 mEq/L Final  . Potassium 10/17/2016 3.9  3.5 - 5.1 mEq/L Final  . Chloride 10/17/2016 93* 98 - 109 mEq/L Final  . CO2 10/17/2016 24  22 - 29 mEq/L Final  . Glucose 10/17/2016 91  70 - 140 mg/dl Final  . BUN 10/17/2016 28.4* 7.0 - 26.0 mg/dL Final  . Creatinine 10/17/2016 2.2* 0.6 - 1.1 mg/dL Final  . Total Bilirubin 10/17/2016 0.45  0.20 - 1.20 mg/dL Final  . Alkaline Phosphatase 10/17/2016 84  40 - 150 U/L Final  . AST 10/17/2016 19  5 - 34 U/L Final  . ALT 10/17/2016 10  0 - 55 U/L Final  . Total Protein 10/17/2016 6.5  6.4 - 8.3 g/dL Final  . Albumin 10/17/2016 2.6* 3.5 -  5.0 g/dL Final  . Calcium 10/17/2016 9.1  8.4 - 10.4 mg/dL Final  . Anion Gap 10/17/2016 12* 3 - 11 mEq/L Final  . EGFR 10/17/2016 25* >90 ml/min/1.73 m2 Final  . Magnesium 10/17/2016 1.8  1.5 - 2.5 mg/dl Final  . WBC 10/17/2016 8.4  3.9 - 10.3 10e3/uL Final  . NEUT# 10/17/2016 5.9  1.5 - 6.5 10e3/uL Final  . HGB 10/17/2016 11.4* 11.6 - 15.9 g/dL Final  . HCT 10/17/2016 32.9* 34.8 - 46.6 % Final  . Platelets 10/17/2016 192  145 - 400 10e3/uL Final  . MCV 10/17/2016 102.2* 79.5 - 101.0 fL Final  . MCH 10/17/2016 35.4* 25.1 - 34.0 pg Final  . MCHC 10/17/2016 34.7  31.5 - 36.0 g/dL Final  . RBC 10/17/2016 3.22* 3.70 - 5.45 10e6/uL Final  . RDW 10/17/2016 14.8* 11.2 - 14.5 % Final  . lymph# 10/17/2016 1.8  0.9 - 3.3 10e3/uL Final  . MONO# 10/17/2016 0.4  0.1 - 0.9 10e3/uL Final  . Eosinophils Absolute 10/17/2016 0.1  0.0 - 0.5 10e3/uL Final  . Basophils Absolute 10/17/2016 0.1  0.0 - 0.1 10e3/uL Final   . NEUT% 10/17/2016 70.8  38.4 - 76.8 % Final  . LYMPH% 10/17/2016 21.8  14.0 - 49.7 % Final  . MONO% 10/17/2016 4.2  0.0 - 14.0 % Final  . EOS% 10/17/2016 1.5  0.0 - 7.0 % Final  . BASO% 10/17/2016 1.7  0.0 - 2.0 % Final    RADIOGRAPHIC STUDIES: No results found.  ASSESSMENT/PLAN:    Rectal fissure Patient has a history of a chronic rectal fissure/tear.  She states that having the diarrhea has aggravated the rectal tear.  Patient states that she has been too fatigued/week to take either a shower or bath recently; and is requesting a prescription for a shower chair.  Patient was encouraged to take a shower or bath and to keep the rectal fissure area clean to avoid infection.  She was given a prescription for a shower chair today.  This provider then called and gave the printed information to the patient and her family regarding to home medical supply stores that will be open tomorrow, so patient can obtain a shower chair.  Hypoalbuminemia due to protein-calorie malnutrition ([place]) Patient's albumin continues low at 2.6.  Patient was  advised to push protein in her diet is much as possible.  Diarrhea States that she's been having diarrhea for the past 6 days.  She's been taking intermittent Imodium with minimal effectiveness.  Patient appears mildly dehydrated today, but vital signs are stable.  Patient is also afebrile.  Long discussion with both patient and her family regarding the use of Lomotil alternating with Imodium.  Patient was given a Lomotil prescription today.  Also, patient will be given a stool for C. difficile kit to take home and return to the lab tomorrow for evaluation.  Patient was also advised she may need to consider a clear liquid diet until she is better able to tolerate a bland diet, and her diarrhea clears.  Dehydration Patient's been having diarrhea for the past 6 days and admits that she's had minimal appetite and very poor oral intake.  Sodium was decreased  to 130 and creatinine was increased to 2.2.  Patient was given approximately 750 ML's normal saline IV fluid rehydration while at the cancer Center today.  Breast cancer of upper-outer quadrant of right female breast [facility]) Patient has been diagnosed with right breast cancer that has metastasized to the ovaries.  She continues to take anastrozole oral therapy; as well as Ibrance oral therapy.  See further notes for details of today's visit.  Labs obtained today were all essentially within normal limits with the exception of hyponatremia.  Patient is scheduled to return on 11/07/2016 for labs, visit, and an injection.  Ascites Patient has malignant ovarian metastasis; and receives chronic paracentesis therapeutically.  Her last paracentesis was approximately 2 weeks ago where they removed 4.8 L of fluid.  Patient presented to the [facility] today with ascites.  Once again.  She stated that she feels very full and bloated.  Patient states that she was scheduled for paracentesis today; but felt too bad to come to the paracentesis appointment today.  She was rescheduled to the paracentesis for 11/08/16.  Given patient's pronounced ascites today-this provider was able to obtain a paracentesis appointment in interventional radiology for tomorrow 10/18/2016 at 11:15 AM.  Reviewed all with Dr. Jana Hakim; and he recommended the patient has no maximum amount of fluid removed during her paracentesis tomorrow.  This provider will call the interventional radiology physician assistant to inform him of the removal of the maximum limit of abdominal fluid.  Also, this provider encouraged both the patient and her family to make sure that they make the paracentesis appointment that is scheduled for tomorrow.   Patient stated understanding of all instructions; and was in agreement with this plan of care. The patient knows to call the clinic with any problems, questions or concerns.   Total time spent with  patient was 40 minutes;  with greater than 75 percent of that time spent in face to face counseling regarding patient's symptoms,  and coordination of care and follow up.  Disclaimer:This dictation was prepared with Dragon/digital dictation along with [employer]. Any transcriptional errors that result from this process are unintentional.  Drue Second, NP 10/17/2016

## 2016-10-17 NOTE — Assessment & Plan Note (Signed)
Patient's albumin continues low at 2.6.  Patient was  advised to push protein in her diet is much as possible.

## 2016-10-17 NOTE — Telephone Encounter (Signed)
This RN spoke to pt per communication stating onset of diarrhea x 4 days with incontinence.  Per inquiry with pt's dtr - [name] [name] - " Momma was very constipated last week and took a bunch of laxatives and then ate a can of pears and some prunes - then she started having diarrhea and it hasn't stopped "  Pt is having incontinence primarily at night due to tiredness " she just sleeps thru pooping "  Per concerns with dehydration, electrolyte imbalance and possible C diff this RN requested for pt to come in for symptom management.  [name] [name] was very reluctant due to " not feeling good " and " I haven't taken a shower ".  This RN explained to [name] [name] above may be happening due to medications being given from this office and if not treated properly could become very serious with pt having to go to the ER and be admitted.   Due to holiday coming up and MD out of the office - Dr Jana Hakim is requesting pt to come in today for [facility].  Pt agreed to above - her mother was called by this RN to inform and provide transportation.

## 2016-10-17 NOTE — Assessment & Plan Note (Signed)
Patient has been diagnosed with right breast cancer that has metastasized to the ovaries.  She continues to take anastrozole oral therapy; as well as Ibrance oral therapy.  See further notes for details of today's visit.  Labs obtained today were all essentially within normal limits with the exception of hyponatremia.  Patient is scheduled to return on 11/07/2016 for labs, visit, and an injection.

## 2016-10-17 NOTE — Patient Instructions (Signed)

## 2016-10-18 ENCOUNTER — Ambulatory Visit (HOSPITAL_COMMUNITY)
Admission: RE | Admit: 2016-10-18 | Discharge: 2016-10-18 | Disposition: A | Discharge status: Home / Self Care | Payer: Medicare Other | Source: Ambulatory Visit | Attending: Interventional Radiology | Admitting: Interventional Radiology

## 2016-10-18 ENCOUNTER — Other Ambulatory Visit (HOSPITAL_COMMUNITY)
Admission: RE | Admit: 2016-10-18 | Discharge: 2016-10-18 | Disposition: A | Discharge status: Home / Self Care | Payer: Medicare Other | Source: Ambulatory Visit | Attending: Nurse Practitioner | Admitting: Nurse Practitioner

## 2016-10-18 ENCOUNTER — Telehealth: Payer: Self-pay | Admitting: Nurse Practitioner

## 2016-10-18 ENCOUNTER — Other Ambulatory Visit: Payer: Self-pay | Admitting: Oncology

## 2016-10-18 ENCOUNTER — Other Ambulatory Visit: Payer: Self-pay | Admitting: Nurse Practitioner

## 2016-10-18 ENCOUNTER — Ambulatory Visit: Payer: Medicare Other

## 2016-10-18 ENCOUNTER — Other Ambulatory Visit: Payer: Self-pay | Admitting: *Deleted

## 2016-10-18 DIAGNOSIS — R188 Other ascites: Secondary | ICD-10-CM | POA: Diagnosis present

## 2016-10-18 DIAGNOSIS — C786 Secondary malignant neoplasm of retroperitoneum and peritoneum: Secondary | ICD-10-CM

## 2016-10-18 DIAGNOSIS — C50411 Malignant neoplasm of upper-outer quadrant of right female breast: Secondary | ICD-10-CM

## 2016-10-18 DIAGNOSIS — Z170 Estrogen receptor positive status [ER+]: Principal | ICD-10-CM

## 2016-10-18 DIAGNOSIS — C7960 Secondary malignant neoplasm of unspecified ovary: Secondary | ICD-10-CM

## 2016-10-18 DIAGNOSIS — Z853 Personal history of malignant neoplasm of breast: Secondary | ICD-10-CM | POA: Insufficient documentation

## 2016-10-18 DIAGNOSIS — C7982 Secondary malignant neoplasm of genital organs: Secondary | ICD-10-CM

## 2016-10-18 DIAGNOSIS — C7989 Secondary malignant neoplasm of other specified sites: Secondary | ICD-10-CM

## 2016-10-18 DIAGNOSIS — R197 Diarrhea, unspecified: Secondary | ICD-10-CM

## 2016-10-18 NOTE — Procedures (Signed)
Patient presents today for schedule US guided paracentesis.  Successful paracentesis from the right mid abdomen.  Patient with elevated Cr: 2.2.  After discussion with MD, will limit to 6L.  6.0 L of clear, yellow fluid removed.   Brynda Greathouse, MS RD PA-C

## 2016-10-18 NOTE — Telephone Encounter (Signed)
Called and spoke to interventional radiology physician assistant Rowe Robert in regards to patient's planned paracentesis later this morning.  Informed Lennette Bihari that there was no maximum amount of abdominal fluid that should be removed per Dr. Jana Hakim.

## 2016-10-20 ENCOUNTER — Telehealth: Payer: Self-pay | Admitting: *Deleted

## 2016-10-20 NOTE — Telephone Encounter (Signed)
Received call from pt stating that she was returning a call from someone.  Noted Beth RN/Symptom  management had called pt to check on.  Pt reports she is doing great & has no diarrhea.  She reports some gas but is eating & feels much better after having drainage.

## 2016-10-20 NOTE — Telephone Encounter (Signed)
TCT patient. No answer but was able to leave VM message inquiring about pt's status regarding diarrhea and how she was doing post paracentesis on 10/18/16.  Advised pt via vm that she could call if needed @ [phone].

## 2016-10-23 ENCOUNTER — Telehealth: Payer: Self-pay

## 2016-10-23 DIAGNOSIS — C50411 Malignant neoplasm of upper-outer quadrant of right female breast: Secondary | ICD-10-CM

## 2016-10-23 DIAGNOSIS — Z170 Estrogen receptor positive status [ER+]: Principal | ICD-10-CM

## 2016-10-23 MED ORDER — ONDANSETRON 8 MG PO TBDP: 8.0000 mg | ORAL_TABLET | Freq: Two times a day (BID) | ORAL | 1 refills | Status: DC | PRN

## 2016-10-23 NOTE — Telephone Encounter (Signed)
Pt called for zofran ODT refill. Done per protocol.

## 2016-10-23 NOTE — Telephone Encounter (Signed)
Error, needed to open new telephone encounter for zofran refill

## 2016-10-25 ENCOUNTER — Encounter: Payer: Self-pay | Admitting: Oncology

## 2016-10-25 NOTE — Progress Notes (Signed)
Faxed completed [place] re enrollment application for [phone] along w/ required docs needed for processing today.

## 2016-10-26 ENCOUNTER — Ambulatory Visit (HOSPITAL_COMMUNITY): Admission: RE | Admit: 2016-10-26 | Payer: Medicare Other | Source: Ambulatory Visit

## 2016-10-26 ENCOUNTER — Other Ambulatory Visit: Payer: Self-pay | Admitting: *Deleted

## 2016-10-26 MED ORDER — PALBOCICLIB 125 MG PO CAPS: 125.0000 mg | ORAL_CAPSULE | Freq: Every day | ORAL | 4 refills | Status: DC

## 2016-10-30 ENCOUNTER — Telehealth: Payer: Self-pay | Admitting: Pharmacist

## 2016-11-02 NOTE — Telephone Encounter (Signed)
Received fax notification from [place] patient assistance program that Ibrance 125mg , 21 capsules - has been shipped to the patient's home address on October 30, 2016.  Please call [phone] with any questions.  Raul Del, PharmD, BCPS, BCOP [facility] [phone]

## 2016-11-06 ENCOUNTER — Ambulatory Visit: Payer: Self-pay

## 2016-11-06 ENCOUNTER — Other Ambulatory Visit: Payer: Self-pay

## 2016-11-06 NOTE — Progress Notes (Signed)
Patient ID: [name] [name], female   DOB: [birth year], 55 y.o.   MRN: [id number] ID: [name] [name] OB: Oct 22, 1961  MR#: [id number]  OVF#:[id number]  Patient Care Team: Marcine Matar., MD as PCP - General (Unknown Physician Specialty) Cammy Copa, MD as Consulting Physician (Orthopedic Surgery) Lowella Dell, MD as Consulting Physician (Hematology and Oncology) Brock Bad, MD as Consulting Physician (Obstetrics and Gynecology) Adolphus Birchwood, MD as Consulting Physician (Obstetrics and Gynecology) Sebastian Ache, MD as Consulting Physician (Urology) Margaretann Loveless, MD as Referring Physician (Internal Medicine) Ovidio Kin, MD as Consulting Physician (General Surgery) Etter Sjogren, MD as Consulting Physician (Plastic Surgery) Billey Co, RN as Registered Nurse OTHER MD:  Romie Levee, MD  CHIEF COMPLAINT:  Hx of Bilateral Breast Cancers  CURRENT TREATMENT: fulvestrant, palbociclib, anastrozole  BREAST CANCER HISTORY: From the original intake note:  [name] [name] had not been able to have mammography for several years because of cost issues. More recently, she had noted a mass in her right breast but thought this might be MRSA. The urinary tract infection took her to her physician's office, and she brought the breast mass to the attention of [name]. She set up the patient for bilateral diagnostic mammography and right breast ultrasound at the [facility] 03/27/2013, which showed a 4 cm irregular mass in the upper outer right breast, with a 9 mm cluster of calcifications in the posterior lower right breast and a few mildly enlarged level I right axillary lymph nodes. The breast mass was palpable and ultrasound showed it to measure 4.1 cm, with some of the level I right axillary lymph nodes noted to have lost their fatty hilum.  Biopsy of both the suspicious areas in the right breast as well as one of the suspicious lymph nodes was performed 03/27/2013 and showed ([id number])  the larger breast mass to consist of an invasive ductal carcinoma with lobular features, grade 2, estrogen and progesterone receptor both 100% positive, with an MIB-1 of 15%, and no HER-2 amplification. 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 The sampled right axillary lymph node was also positive.  Bilateral breast MRI 04/01/2013 showed the larger right breast mass to measure 5.4 cm, with a satellite nodule immediately inferior to it measuring 8 mm. 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 There were multiple enlarged right axillary lymph nodes, the largest measuring 2.8 cm. In addition, in the left breast there were 2 foci on non-masslike enhancement consistent with DCIS measuring 1.3 cm and 2.2 cm. These weren't different quadrants.  The patient's subsequent history is as noted below  INTERVAL HISTORY: Shene returns today for follow up of her estrogen receptor positive metastatic breast cancer, accompanied by her daughter and a friend: The patient and her daughter live in the upstairs portion of this friend's home together with the patient's husband and multiple dogs. For this reason they tell me it is not possible for home health nursing currently to come visit, as the dogs would not allow it.  Jerrye is receiving fulvestrant monthly, with good tolerance. She is also on palbociclib. They were able to tell me that she is in the second week of the current 21 day cycle. He received 21 tablets at a time and they mark when they need to resume the next cycle accurately. The patient's daughter is mostly in charge of her mother's  medication. In general [name] [name] gives me no side effects from either the palbociclib only anastrozole that she is receiving  REVIEW OF SYSTEMS: [name] [name] tells me she is very constipated. She also tells me that she has diarrhea. Usually she has a bowel movement right about bed  time. In fact she can only have bowel movements while lying in bed, she says. They do not have a bed pan but they use a diaper. [name] [name] tells me she is not sleeping she tells me her belly is filling up with fluid again. She has uncontrolled pain which she describes as being "deep inside my butt". She tells me she is allergic to all narcotics except morphine. She describes her self is severely fatigued. She has blurred vision. She has problems with her years. She has chest wall pain all the time. She is short of breath even at rest. She has nausea and heartburn issues. She has stress urinary incontinence. She also has pain in her back and joints. She is forgetful, anxious, and depressed. Aside from these issues a detailed review of systems today was stable  PAST MEDICAL HISTORY: Past Medical History:  Diagnosis Date  . Anxiety   . Breast cancer ([facility]) 03/27/13   right  . Depression   . Headache(784.0)   . Hx of radiation therapy 10/02/13- 11/21/13   right chest wall, right supraclav/axillary region 5040 cGy 28 sessions, right chest wall mastectomy scar boost 1000 cGy 5 sessions  . Hypertension   . Shortness of breath   . Sleep apnea    cpcp    PAST SURGICAL HISTORY: Past Surgical History:  Procedure Laterality Date  . MASTECTOMY MODIFIED RADICAL Right 04/07/2013   Procedure: RIGHT MASTECTOMY MODIFIED RADICAL;  Surgeon: Shann Medal, MD;  Location: [facility];  Service: General;  Laterality: Right;  . MASTECTOMY, RADICAL Right 04/08/2013  . PORTACATH PLACEMENT Left 04/07/2013   Procedure: INSERTION PORT-A-CATH;  Surgeon: Shann Medal, MD;  Location: [facility];  Service: General;  Laterality: Left;  . SIMPLE MASTECTOMY WITH AXILLARY SENTINEL NODE BIOPSY Left 04/08/2013  . SIMPLE MASTECTOMY WITH AXILLARY SENTINEL NODE BIOPSY Left 04/07/2013   Procedure:  LEFT SMPLE MASTECTOMY WITH AXILLARY SENTINEL NODE BIOPSY;  Surgeon: Shann Medal, MD;  Location: [facility];  Service: General;  Laterality: Left;    FAMILY  HISTORY Family History  Problem Relation Age of Onset  . Breast cancer Mother 56  . Breast cancer Maternal Grandmother 25  . Prostate cancer Paternal Grandfather 11   the patient's father died at age 4 in an airplane crash (he was a Engineer, maintenance (IT) unsupervised). The patient's mother is alive at age 42. Her name is [name] and she works for [employer]. [name] has a history of breast cancer diagnosed at age 45. She had one sister who is alive and well. [name]'s mother, the patient's grandmother, was diagnosed with breast cancer at age 63. She had 2 sisters, neither with breast cancer. Tangia herself had one brother, no sisters. There is no other history of breast or ovary cancer in the family.  GYNECOLOGIC HISTORY:   (updated 02/03/2014)  Menarche age 29, she is GX P78, first live birth age 49. The patient was taking birth control pills until September 2013. She stopped having periods at that time, but had one in late March 2014.  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 She is now unemployed. Her husband Takya Vandivier (goes by "[name]") is disabled secondary  to diabetes. They were separated for the past 2 years, but [name] recently moved back in with Elmer. Millee lives with her mother [name] and 75 poodles. The patient's daughter [name] "[name]" Centanni is 99 and also lives with the patient.    ADVANCED DIRECTIVES: In place. The patient has named her daughter [name] as her healthcare power of attorney, bypassing her husband.  HEALTH MAINTENANCE:  (Updated 02/03/2014) Social History  Substance Use Topics  . Smoking status: Never Smoker  . Smokeless tobacco: Never Used  . Alcohol use No     Colonoscopy: Never  PAP: Possibly 2006, Not on file  Bone density: Possibly 2004, Not on file  Lipid panel: Not on file  Allergies  Allergen Reactions  . Vicodin [Hydrocodone-Acetaminophen] Anaphylaxis  . Codeine Nausea And Vomiting  .  Penicillins Rash  . Percocet [Oxycodone-Acetaminophen] Itching  . Septra [Bactrim] Other (See Comments)    Heart races    Current Outpatient Prescriptions  Medication Sig Dispense Refill  . acyclovir (ZOVIRAX) 400 MG tablet Take 1 tablet (400 mg total) by mouth 2 (two) times daily. 60 tablet 5  . ALPRAZolam (XANAX) 0.5 MG tablet Take 1 tablet (0.5 mg total) by mouth 3 (three) times daily as needed for anxiety. 90 tablet 3  . anastrozole (ARIMIDEX) 1 MG tablet Take 1 tablet (1 mg total) by mouth daily. 90 tablet 6  . aspirin 81 MG tablet Take 81 mg by mouth daily.    Marland Kitchen diltiazem 2 % GEL Apply 1 application topically 2 (two) times daily. (Patient taking differently: Apply 1 application topically as needed. ) 30 g 1  . escitalopram (LEXAPRO) 20 MG tablet Take 1 tablet (20 mg total) by mouth daily. 90 tablet 3  . lidocaine-prilocaine (EMLA) cream Apply 1 application topically as needed. 30 g 1  . meloxicam (MOBIC) 15 MG tablet Take 1 tablet (15 mg total) by mouth daily. 30 tablet 3  . metoprolol succinate (TOPROL-XL) 25 MG 24 hr tablet Take 25 mg by mouth daily.    . miconazole (MICOTIN) 2 % powder Apply topically as needed for itching. Reported on 01/18/2016    . palbociclib (IBRANCE) 125 MG capsule Take 1 capsule (125 mg total) by mouth daily with breakfast. Take whole with food. 21 capsule 4  . prochlorperazine (COMPAZINE) 10 MG tablet Take 1 tablet (10 mg total) by mouth every 6 (six) hours as needed for nausea or vomiting. Can cause drowsiness. 30 tablet 0  . traMADol (ULTRAM) 50 MG tablet Take 1-2 tablets (50-100 mg total) by mouth every 6 (six) hours as needed. 120 tablet 0  . triamterene-hydrochlorothiazide (MAXZIDE-25) 37.5-25 MG per tablet Take 1 tablet by mouth daily.     No current facility-administered medications for this visit.     Middle-aged white woman examined in a wheelchair  Vitals:   11/07/16 1430  BP: 134/89  Pulse: 94  Resp: 18  Temp: 98.6 F (37 C)     There is  no height or weight on file to calculate BMI.    ECOG FS: 2 Filed Weights   Sclerae unicteric, pupils round and equal Oropharynx shows no thrush or other lesions No cervical or supraclavicular adenopathy Lungs no rales or rhonchi Heart regular rate and rhythm Abd soft, distended, obese, nontender, positive bowel sounds Neuro: nonfocal, well oriented, depressed affect Breasts: Deferred  LAB RESULTS:  Lab Results  Component Value Date   WBC 9.4 11/07/2016   NEUTROABS 7.7 (H) 11/07/2016   HGB 10.8 (L) 11/07/2016  HCT 31.6 (L) 11/07/2016   MCV 102.9 (H) 11/07/2016   PLT 225 11/07/2016      Chemistry      Component Value Date/Time   NA 134 (L) 11/07/2016 1359   K 3.9 11/07/2016 1359   CL 100 10/08/2013 1246   CL 103 05/20/2013 1404   CO2 25 11/07/2016 1359   BUN 42.2 (H) 11/07/2016 1359   CREATININE 2.7 (H) 11/07/2016 1359      Component Value Date/Time   CALCIUM 9.5 11/07/2016 1359   ALKPHOS 107 11/07/2016 1359   AST 17 11/07/2016 1359   ALT 10 11/07/2016 1359   BILITOT 0.66 11/07/2016 1359      Ref Range & Units 4wk ago 63mo ago 34mo ago 19mo ago    CA 27.29 0.0 - 38.6 U/mL 1,440.3   1,265.2CM   990.4CM   911.1CM    Comments: Specimen was diluted in      STUDIES: US Paracentesis  Result Date: 10/18/2016 INDICATION: Patient with history of metastatic breast cancer and recurrent ascites presents for therapeutic US guided paracentesis EXAM: ULTRASOUND GUIDED THERAPEUTIC PARACENTESIS MEDICATIONS: None. COMPLICATIONS: None immediate. PROCEDURE: Informed written consent was obtained from the patient after a discussion of the risks, benefits and alternatives to treatment. A timeout was performed prior to the initiation of the procedure. Initial ultrasound scanning demonstrates a large amount of ascites within the right mid abdomen. The right mid abdomen was prepped and draped in the usual sterile fashion. 1% lidocaine was used for local anesthesia. Following this, a Yueh  catheter was introduced. An ultrasound image was saved for documentation purposes. The paracentesis was performed. The catheter was removed and a dressing was applied. The patient tolerated the procedure well without immediate post procedural complication. FINDINGS: A total of approximately 6.0 liters of clear, yellow fluid was removed. IMPRESSION: Successful ultrasound-guided therapeutic paracentesis yielding 6.0 liters of peritoneal fluid. Read by Loyce Dys PA-C Electronically Signed   By: Jolaine Click M.D.   On: 10/18/2016 13:37    ASSESSMENT: 55 y.o. 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 There were also 2 suspicious areas in the left breast noted by MRI.  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 Patient's testing pending  (5)  completed adjuvant chemotherapy, consisting of 6 cycles of docetaxel/ doxorubicin/ cyclophosphamide given on a Q. three-week basis, first dose  05/21/2013, last dose 09/03/2013   (6)  postmastectomy radiation therapy, completed 11/21/2013  (7) tamoxifen started December 2014, continued intermittently due to financial problems  (a) letrozole started September 2016, discontinued June  2017 with metastatic recurrence  METASTATIC DISEASE: May 2017 (8) PET scan 05/08/2016 findings hypermetabolic metastases in the uterus, left pelvis, peritoneum, upper abdomen adenopathy, and bones, with ascites and peritoneal disease  but no evidence of liver or lung involvement  (a) cervical biopsy 05/23/2017shows metastatic lobular breast cancer with a signet ring component,E-cadherin negative, gross cystic disease fluid protein and estrogen receptor positive.  (b) ascitic fluid from 10/06/2016 paracentesis confirms adenocarcinoma cells in fluid  (9) fulvestrant started 05/17/2016, with palbociclib started 05/18/2016 at 125 mg daily (21//7)  (a) anastrozole added 10/09/2016   (10) right hydronephrosis noted on PET scan 05/08/2016--  (a) evaluated by urology; recommended observation  PLAN:  Opal does well with her treatments and if they could control her symptoms we certainly could continue with these indefinitely. I am concerned that her CA-27-29 has been rising (it was repeated today, results pending). Also she is continuing to require repeat thoracentesis and had relatively brief intervals.  Accordingly we discussed the possibility of discontinuing treatment and moving on to a comfort care approach under hospice. The problem is the family feels at this point either hospice nor home health would be able to visit because of the dogs in the house. The daughter tells me now that Anjuli has disability they may be able to afford moving to a one floor apartment and at that point home health might be able to visit.  In the meantime Barbera would benefit from repeat paracentesis and I have gone ahead and enter the order to see if that can be done this week. I have requested that a Pleurx be inserted and left 10. That way she could be more easily drained and we could do that in the office if she cannot do it at home. Eventually the family could potentially learn how to do this.  I will see her again in a  month. At that time we will consider restaging studies and if there is evidence of progression on the current treatment we will consider CMF other chemotherapy agents    Chauncey Cruel, MD   11/07/2016

## 2016-11-07 ENCOUNTER — Ambulatory Visit (HOSPITAL_BASED_OUTPATIENT_CLINIC_OR_DEPARTMENT_OTHER): Payer: Medicare Other | Admitting: Oncology

## 2016-11-07 ENCOUNTER — Ambulatory Visit (HOSPITAL_BASED_OUTPATIENT_CLINIC_OR_DEPARTMENT_OTHER): Payer: Medicare Other

## 2016-11-07 ENCOUNTER — Other Ambulatory Visit (HOSPITAL_BASED_OUTPATIENT_CLINIC_OR_DEPARTMENT_OTHER): Payer: Medicare Other

## 2016-11-07 VITALS — BP 134/89 | HR 94 | Temp 98.6°F | Resp 18 | Ht 62.0 in

## 2016-11-07 DIAGNOSIS — C786 Secondary malignant neoplasm of retroperitoneum and peritoneum: Secondary | ICD-10-CM

## 2016-11-07 DIAGNOSIS — Z170 Estrogen receptor positive status [ER+]: Principal | ICD-10-CM

## 2016-11-07 DIAGNOSIS — C7989 Secondary malignant neoplasm of other specified sites: Secondary | ICD-10-CM

## 2016-11-07 DIAGNOSIS — C773 Secondary and unspecified malignant neoplasm of axilla and upper limb lymph nodes: Secondary | ICD-10-CM

## 2016-11-07 DIAGNOSIS — Z5111 Encounter for antineoplastic chemotherapy: Secondary | ICD-10-CM | POA: Diagnosis present

## 2016-11-07 DIAGNOSIS — C7982 Secondary malignant neoplasm of genital organs: Secondary | ICD-10-CM

## 2016-11-07 DIAGNOSIS — C50411 Malignant neoplasm of upper-outer quadrant of right female breast: Secondary | ICD-10-CM

## 2016-11-07 DIAGNOSIS — C7960 Secondary malignant neoplasm of unspecified ovary: Secondary | ICD-10-CM

## 2016-11-07 LAB — CBC WITH DIFFERENTIAL/PLATELET
BASO%: 0.5 % (ref 0.0–2.0)
Basophils Absolute: 0.1 10*3/uL (ref 0.0–0.1)
EOS%: 0.5 % (ref 0.0–7.0)
Eosinophils Absolute: 0.1 10*3/uL (ref 0.0–0.5)
HCT: 31.6 % — ABNORMAL LOW (ref 34.8–46.6)
HEMOGLOBIN: 10.8 g/dL — AB (ref 11.6–15.9)
LYMPH#: 1.2 10*3/uL (ref 0.9–3.3)
LYMPH%: 12.8 % — AB (ref 14.0–49.7)
MCH: 35.2 pg — ABNORMAL HIGH (ref 25.1–34.0)
MCHC: 34.2 g/dL (ref 31.5–36.0)
MCV: 102.9 fL — ABNORMAL HIGH (ref 79.5–101.0)
MONO#: 0.4 10*3/uL (ref 0.1–0.9)
MONO%: 4.4 % (ref 0.0–14.0)
NEUT%: 81.8 % — ABNORMAL HIGH (ref 38.4–76.8)
NEUTROS ABS: 7.7 10*3/uL — AB (ref 1.5–6.5)
PLATELETS: 225 10*3/uL (ref 145–400)
RBC: 3.07 10*6/uL — AB (ref 3.70–5.45)
RDW: 15 % — AB (ref 11.2–14.5)
WBC: 9.4 10*3/uL (ref 3.9–10.3)

## 2016-11-07 LAB — COMPREHENSIVE METABOLIC PANEL
ALBUMIN: 2.2 g/dL — AB (ref 3.5–5.0)
ALT: 10 U/L (ref 0–55)
ANION GAP: 13 meq/L — AB (ref 3–11)
AST: 17 U/L (ref 5–34)
Alkaline Phosphatase: 107 U/L (ref 40–150)
BILIRUBIN TOTAL: 0.66 mg/dL (ref 0.20–1.20)
BUN: 42.2 mg/dL — ABNORMAL HIGH (ref 7.0–26.0)
CO2: 25 meq/L (ref 22–29)
CREATININE: 2.7 mg/dL — AB (ref 0.6–1.1)
Calcium: 9.5 mg/dL (ref 8.4–10.4)
Chloride: 97 mEq/L — ABNORMAL LOW (ref 98–109)
EGFR: 20 mL/min/{1.73_m2} — ABNORMAL LOW (ref >90)
GLUCOSE: 99 mg/dL (ref 70–140)
Potassium: 3.9 mEq/L (ref 3.5–5.1)
Sodium: 134 mEq/L — ABNORMAL LOW (ref 136–145)
TOTAL PROTEIN: 6.8 g/dL (ref 6.4–8.3)

## 2016-11-07 MED ORDER — FULVESTRANT 250 MG/5ML IM SOLN
500.0000 mg | Freq: Once | INTRAMUSCULAR | Status: AC
  Administered 2016-11-07: 500 mg via INTRAMUSCULAR
  Filled 2016-11-07: qty 10

## 2016-11-07 MED ORDER — TRAMADOL HCL 50 MG PO TABS
50.0000 mg | ORAL_TABLET | Freq: Four times a day (QID) | ORAL | 0 refills | Status: AC | PRN
Start: 2016-11-07 — End: ?

## 2016-11-07 NOTE — Patient Instructions (Signed)

## 2016-11-08 ENCOUNTER — Other Ambulatory Visit: Payer: Self-pay | Admitting: Radiology

## 2016-11-08 ENCOUNTER — Other Ambulatory Visit: Payer: Self-pay | Admitting: General Surgery

## 2016-11-08 ENCOUNTER — Other Ambulatory Visit: Payer: Self-pay | Admitting: *Deleted

## 2016-11-08 DIAGNOSIS — C50411 Malignant neoplasm of upper-outer quadrant of right female breast: Secondary | ICD-10-CM

## 2016-11-08 DIAGNOSIS — Z5189 Encounter for other specified aftercare: Secondary | ICD-10-CM

## 2016-11-08 DIAGNOSIS — Z170 Estrogen receptor positive status [ER+]: Principal | ICD-10-CM

## 2016-11-08 DIAGNOSIS — R180 Malignant ascites: Secondary | ICD-10-CM

## 2016-11-08 DIAGNOSIS — C786 Secondary malignant neoplasm of retroperitoneum and peritoneum: Secondary | ICD-10-CM

## 2016-11-08 LAB — CANCER ANTIGEN 27.29: CA 27.29: 1353.2 U/mL — ABNORMAL HIGH (ref 0.0–38.6)

## 2016-11-09 ENCOUNTER — Ambulatory Visit (HOSPITAL_COMMUNITY)
Admission: RE | Admit: 2016-11-09 | Discharge: 2016-11-09 | Disposition: A | Discharge status: Home / Self Care | Payer: Medicare Other | Source: Ambulatory Visit | Attending: Oncology | Admitting: Oncology

## 2016-11-09 ENCOUNTER — Encounter (HOSPITAL_COMMUNITY): Payer: Self-pay

## 2016-11-09 ENCOUNTER — Other Ambulatory Visit: Payer: Self-pay | Admitting: Oncology

## 2016-11-09 ENCOUNTER — Ambulatory Visit (HOSPITAL_COMMUNITY): Payer: Medicare Other

## 2016-11-09 DIAGNOSIS — C7989 Secondary malignant neoplasm of other specified sites: Secondary | ICD-10-CM

## 2016-11-09 DIAGNOSIS — C50911 Malignant neoplasm of unspecified site of right female breast: Secondary | ICD-10-CM | POA: Diagnosis not present

## 2016-11-09 DIAGNOSIS — C7982 Secondary malignant neoplasm of genital organs: Secondary | ICD-10-CM

## 2016-11-09 DIAGNOSIS — Z170 Estrogen receptor positive status [ER+]: Secondary | ICD-10-CM

## 2016-11-09 DIAGNOSIS — Z923 Personal history of irradiation: Secondary | ICD-10-CM | POA: Insufficient documentation

## 2016-11-09 DIAGNOSIS — Z7982 Long term (current) use of aspirin: Secondary | ICD-10-CM | POA: Diagnosis not present

## 2016-11-09 DIAGNOSIS — C786 Secondary malignant neoplasm of retroperitoneum and peritoneum: Secondary | ICD-10-CM

## 2016-11-09 DIAGNOSIS — Z803 Family history of malignant neoplasm of breast: Secondary | ICD-10-CM | POA: Insufficient documentation

## 2016-11-09 DIAGNOSIS — G4730 Sleep apnea, unspecified: Secondary | ICD-10-CM | POA: Insufficient documentation

## 2016-11-09 DIAGNOSIS — F329 Major depressive disorder, single episode, unspecified: Secondary | ICD-10-CM | POA: Insufficient documentation

## 2016-11-09 DIAGNOSIS — I10 Essential (primary) hypertension: Secondary | ICD-10-CM | POA: Diagnosis not present

## 2016-11-09 DIAGNOSIS — Z880 Allergy status to penicillin: Secondary | ICD-10-CM | POA: Insufficient documentation

## 2016-11-09 DIAGNOSIS — C50411 Malignant neoplasm of upper-outer quadrant of right female breast: Secondary | ICD-10-CM

## 2016-11-09 DIAGNOSIS — R180 Malignant ascites: Secondary | ICD-10-CM | POA: Diagnosis present

## 2016-11-09 DIAGNOSIS — F419 Anxiety disorder, unspecified: Secondary | ICD-10-CM | POA: Diagnosis not present

## 2016-11-09 DIAGNOSIS — C7960 Secondary malignant neoplasm of unspecified ovary: Secondary | ICD-10-CM

## 2016-11-09 DIAGNOSIS — Z8042 Family history of malignant neoplasm of prostate: Secondary | ICD-10-CM | POA: Diagnosis not present

## 2016-11-09 HISTORY — PX: IR GENERIC HISTORICAL: IMG1180011

## 2016-11-09 LAB — PROTIME-INR
INR: 1.16
Prothrombin Time: 14.8 seconds (ref 11.4–15.2)

## 2016-11-09 LAB — CBC WITH DIFFERENTIAL/PLATELET
Basophils Absolute: 0.1 10*3/uL (ref 0.0–0.1)
Basophils Relative: 1 %
Eosinophils Absolute: 0.2 10*3/uL (ref 0.0–0.7)
Eosinophils Relative: 2 %
HCT: 27.9 % — ABNORMAL LOW (ref 36.0–46.0)
Hemoglobin: 9.6 g/dL — ABNORMAL LOW (ref 12.0–15.0)
Lymphocytes Relative: 12 %
Lymphs Abs: 1 10*3/uL (ref 0.7–4.0)
MCH: 35.2 pg — ABNORMAL HIGH (ref 26.0–34.0)
MCHC: 34.4 g/dL (ref 30.0–36.0)
MCV: 102.2 fL — ABNORMAL HIGH (ref 78.0–100.0)
Monocytes Absolute: 0.5 10*3/uL (ref 0.1–1.0)
Monocytes Relative: 6 %
Neutro Abs: 6.8 10*3/uL (ref 1.7–7.7)
Neutrophils Relative %: 79 %
Platelets: 188 10*3/uL (ref 150–400)
RBC: 2.73 MIL/uL — ABNORMAL LOW (ref 3.87–5.11)
RDW: 15.2 % (ref 11.5–15.5)
WBC: 8.7 10*3/uL (ref 4.0–10.5)

## 2016-11-09 LAB — COMPREHENSIVE METABOLIC PANEL
ALT: 12 U/L — ABNORMAL LOW (ref 14–54)
AST: 19 U/L (ref 15–41)
Albumin: 2.8 g/dL — ABNORMAL LOW (ref 3.5–5.0)
Alkaline Phosphatase: 83 U/L (ref 38–126)
Anion gap: 10 (ref 5–15)
BUN: 40 mg/dL — ABNORMAL HIGH (ref 6–20)
CO2: 27 mmol/L (ref 22–32)
Calcium: 8.6 mg/dL — ABNORMAL LOW (ref 8.9–10.3)
Chloride: 99 mmol/L — ABNORMAL LOW (ref 101–111)
Creatinine, Ser: 2.26 mg/dL — ABNORMAL HIGH (ref 0.44–1.00)
GFR calc Af Amer: 27 mL/min — ABNORMAL LOW (ref >60)
GFR calc non Af Amer: 24 mL/min — ABNORMAL LOW (ref >60)
Glucose, Bld: 109 mg/dL — ABNORMAL HIGH (ref 65–99)
Potassium: 3.5 mmol/L (ref 3.5–5.1)
Sodium: 136 mmol/L (ref 135–145)
Total Bilirubin: 1 mg/dL (ref 0.3–1.2)
Total Protein: 6.5 g/dL (ref 6.5–8.1)

## 2016-11-09 MED ORDER — SODIUM CHLORIDE 0.9 % IV SOLN
INTRAVENOUS | Status: DC
  Administered 2016-11-09: 12:00:00 via INTRAVENOUS

## 2016-11-09 MED ORDER — LIDOCAINE HCL 1 % IJ SOLN
INTRAMUSCULAR | Status: AC | PRN
  Administered 2016-11-09: 10 mL via INTRADERMAL

## 2016-11-09 MED ORDER — FENTANYL CITRATE (PF) 100 MCG/2ML IJ SOLN
INTRAMUSCULAR | Status: AC | PRN
  Administered 2016-11-09: 50 ug via INTRAVENOUS

## 2016-11-09 MED ORDER — HEPARIN SOD (PORK) LOCK FLUSH 100 UNIT/ML IV SOLN
500.0000 [IU] | INTRAVENOUS | Status: AC | PRN
  Administered 2016-11-09: 500 [IU]
  Filled 2016-11-09: qty 5

## 2016-11-09 MED ORDER — CLINDAMYCIN PHOSPHATE 600 MG/50ML IV SOLN
600.0000 mg | Freq: Once | INTRAVENOUS | Status: AC
  Administered 2016-11-09: 600 mg via INTRAVENOUS
  Filled 2016-11-09: qty 50

## 2016-11-09 MED ORDER — MIDAZOLAM HCL 2 MG/2ML IJ SOLN
INTRAMUSCULAR | Status: AC | PRN
  Administered 2016-11-09: 1 mg via INTRAVENOUS

## 2016-11-09 MED ORDER — FENTANYL CITRATE (PF) 100 MCG/2ML IJ SOLN
INTRAMUSCULAR | Status: AC
  Filled 2016-11-09: qty 4

## 2016-11-09 MED ORDER — MIDAZOLAM HCL 2 MG/2ML IJ SOLN
INTRAMUSCULAR | Status: AC
  Filled 2016-11-09: qty 6

## 2016-11-09 NOTE — H&P (Signed)
Chief Complaint: Patient was seen in consultation today for abdominal PleurX catheter insertion  Referring Physician(s):  Dr. Lurline Del  Supervising Physician: Corrie Mckusick  Patient Status: [facility] - Out-pt  History of Present Illness: [name] [name] is a 55 y.o. female with history of metastatic breast cancer and recurrent ascites presents for placement of an abdominal PleurX catheter.  She is followed by Dr. Jana Hakim for her chemotherapy and cancer care.  Daughter states the cancer center will be assisted in drawing fluid from the PleurX until patient has home health set up.  Of note, patient appears weak and uncomfortable today.  State she is having a lot of pain in her rectum and is very fatigued. She did not take her BP medication this AM.  She has been NPO as of last evening.  She is not on any blood thinners.   Past Medical History:  Diagnosis Date  . Anxiety   . Breast cancer ([place]) 03/27/13   right  . Depression   . Headache(784.0)   . Hx of radiation therapy 10/02/13- 11/21/13   right chest wall, right supraclav/axillary region 5040 cGy 28 sessions, right chest wall mastectomy scar boost 1000 cGy 5 sessions  . Hypertension   . Shortness of breath   . Sleep apnea    cpcp    Past Surgical History:  Procedure Laterality Date  . MASTECTOMY MODIFIED RADICAL Right 04/07/2013   Procedure: RIGHT MASTECTOMY MODIFIED RADICAL;  Surgeon: Shann Medal, MD;  Location: [place];  Service: General;  Laterality: Right;  . MASTECTOMY, RADICAL Right 04/08/2013  . PORTACATH PLACEMENT Left 04/07/2013   Procedure: INSERTION PORT-A-CATH;  Surgeon: Shann Medal, MD;  Location: [place];  Service: General;  Laterality: Left;  . SIMPLE MASTECTOMY WITH AXILLARY SENTINEL NODE BIOPSY Left 04/08/2013  . SIMPLE MASTECTOMY WITH AXILLARY SENTINEL NODE BIOPSY Left 04/07/2013   Procedure:  LEFT SMPLE MASTECTOMY WITH AXILLARY SENTINEL NODE BIOPSY;  Surgeon: Shann Medal, MD;  Location: [place];  Service:  General;  Laterality: Left;    Allergies: Vicodin [hydrocodone-acetaminophen]; Codeine; Penicillins; Percocet [oxycodone-acetaminophen]; and Septra [bactrim]  Medications: Prior to Admission medications   Medication Sig Start Date End Date Taking? Authorizing Provider  ALPRAZolam Duanne Moron) 0.5 MG tablet Take 1 tablet (0.5 mg total) by mouth 3 (three) times daily as needed for anxiety. 07/17/16  Yes Chauncey Cruel, MD  anastrozole (ARIMIDEX) 1 MG tablet Take 1 tablet (1 mg total) by mouth daily. 10/09/16  Yes Chauncey Cruel, MD  aspirin 81 MG tablet Take 81 mg by mouth daily.   Yes Historical Provider, MD  escitalopram (LEXAPRO) 20 MG tablet Take 1 tablet (20 mg total) by mouth daily. 07/17/16  Yes Chauncey Cruel, MD  lidocaine-prilocaine (EMLA) cream Apply 1 application topically as needed. 09/12/16  Yes Chauncey Cruel, MD  meloxicam (MOBIC) 15 MG tablet Take 1 tablet (15 mg total) by mouth daily. 10/09/16  Yes Chauncey Cruel, MD  metoprolol succinate (TOPROL-XL) 25 MG 24 hr tablet Take 25 mg by mouth daily.   Yes Historical Provider, MD  palbociclib (IBRANCE) 125 MG capsule Take 1 capsule (125 mg total) by mouth daily with breakfast. Take whole with food. 10/26/16  Yes Chauncey Cruel, MD  prochlorperazine (COMPAZINE) 10 MG tablet Take 1 tablet (10 mg total) by mouth every 6 (six) hours as needed for nausea or vomiting. Can cause drowsiness. 09/21/16  Yes Chauncey Cruel, MD  traMADol (ULTRAM) 50 MG tablet Take 1-2  tablets (50-100 mg total) by mouth every 6 (six) hours as needed. 11/07/16  Yes Chauncey Cruel, MD  triamterene-hydrochlorothiazide (MAXZIDE-25) 37.5-25 MG per tablet Take 1 tablet by mouth daily.   Yes Historical Provider, MD  acyclovir (ZOVIRAX) 400 MG tablet Take 1 tablet (400 mg total) by mouth 2 (two) times daily. 01/18/16   Laurie Panda, NP  diltiazem 2 % GEL Apply 1 application topically 2 (two) times daily. Patient taking differently: Apply 1  application topically as needed.  05/28/13   Amy Milda Smart, PA-C  miconazole (MICOTIN) 2 % powder Apply topically as needed for itching. Reported on 01/18/2016    Historical Provider, MD     Family History  Problem Relation Age of Onset  . Breast cancer Mother 88  . Breast cancer Maternal Grandmother 35  . Prostate cancer Paternal Grandfather 33    Social History   Social History  . Marital status: Married    Spouse name: N/A  . Number of children: 1  . Years of education: N/A   Social History Main Topics  . Smoking status: Never Smoker  . Smokeless tobacco: Never Used  . Alcohol use No  . Drug use: No  . Sexual activity: Yes     Comment: menarche age 28, 63st live birth 49, hx of  BCP, menopause 9/13 but menstruated March 2014   Other Topics Concern  . None   Social History Narrative  . None    Review of Systems  Constitutional: Positive for activity change and fatigue.  Respiratory: Negative for cough and shortness of breath.   Cardiovascular: Negative for chest pain.  Gastrointestinal: Positive for abdominal pain, constipation and nausea.  Psychiatric/Behavioral: Negative for behavioral problems and confusion.    Vital Signs: BP (!) 166/100 (BP Location: Right Leg)   Pulse 94   Temp 99 F (37.2 C) (Oral)   Resp 18   LMP 03/07/2016   SpO2 96%   Physical Exam  Constitutional: She is oriented to person, place, and time. She appears well-developed.  Cardiovascular: Normal rate, regular rhythm and normal heart sounds.   Pulmonary/Chest: Effort normal and breath sounds normal.  Abdominal: She exhibits distension.  Neurological: She is oriented to person, place, and time.  lethargic  Skin: Skin is warm and dry.  Psychiatric: She has a normal mood and affect. Her behavior is normal. Judgment and thought content normal.  Nursing note and vitals reviewed.   Mallampati Score:  MD Evaluation Airway: WNL Heart: WNL Abdomen: WNL Chest/ Lungs: WNL ASA   Classification: 3 Mallampati/Airway Score: Two  Imaging: US Paracentesis  Result Date: 10/18/2016 INDICATION: Patient with history of metastatic breast cancer and recurrent ascites presents for therapeutic US guided paracentesis EXAM: ULTRASOUND GUIDED THERAPEUTIC PARACENTESIS MEDICATIONS: None. COMPLICATIONS: None immediate. PROCEDURE: Informed written consent was obtained from the patient after a discussion of the risks, benefits and alternatives to treatment. A timeout was performed prior to the initiation of the procedure. Initial ultrasound scanning demonstrates a large amount of ascites within the right mid abdomen. The right mid abdomen was prepped and draped in the usual sterile fashion. 1% lidocaine was used for local anesthesia. Following this, a Yueh catheter was introduced. An ultrasound image was saved for documentation purposes. The paracentesis was performed. The catheter was removed and a dressing was applied. The patient tolerated the procedure well without immediate post procedural complication. FINDINGS: A total of approximately 6.0 liters of clear, yellow fluid was removed. IMPRESSION: Successful ultrasound-guided therapeutic paracentesis yielding 6.0  liters of peritoneal fluid. Read by Brynda Greathouse PA-C Electronically Signed   By: Marybelle Killings M.D.   On: 10/18/2016 13:37    Labs:  CBC:  Recent Labs  10/09/16 1227 10/17/16 1528 11/07/16 1359 11/09/16 1205  WBC 5.9 8.4 9.4 8.7  HGB 11.3* 11.4* 10.8* 9.6*  HCT 33.7* 32.9* 31.6* 27.9*  PLT 133* 192 225 188    COAGS:  Recent Labs  11/09/16 1205  INR 1.16    BMP:  Recent Labs  10/09/16 1227 10/17/16 1528 11/07/16 1359 11/09/16 1205  NA 132* 130* 134* 136  K 3.6 3.9 3.9 3.5  CL  --   --   --  99*  CO2 24 24 25 27   GLUCOSE 97 91 99 109*  BUN 25.7 28.4* 42.2* 40*  CALCIUM 8.9 9.1 9.5 8.6*  CREATININE 2.0* 2.2* 2.7* 2.26*  GFRNONAA  --   --   --  24*  GFRAA  --   --   --  27*    LIVER FUNCTION  TESTS:  Recent Labs  10/09/16 1227 10/17/16 1528 11/07/16 1359 11/09/16 1205  BILITOT 0.30 0.45 0.66 1.0  AST 16 19 17 19   ALT 11 10 10  12*  ALKPHOS 77 84 107 83  PROT 6.2* 6.5 6.8 6.5  ALBUMIN 2.4* 2.6* 2.2* 2.8*    TUMOR MARKERS: No results for input(s): AFPTM, CEA, CA199, CHROMGRNA in the last 8760 hours.  Assessment and Plan: Metastatic breast cancer with recurrent ascites Patient with recurrent ascites presents for abdominal PleurX catheter placement at the request of Dr. Jana Hakim.  Patient is with family today. She is alert, but lethargic and uncomfortable. Reviewed procedure with patient and her family.  She is agreeable to proceed.  Risks and benefits discussed with the patient and her daughter including, but not limited to bleeding, infection, or damage to adjacent structures. All of the patient's and patient's daughter's questions were answered, patient and her daughter are agreeable to proceed. Consent signed and in chart.  Thank you for this interesting consult.  I greatly enjoyed meeting [name] [name] and look forward to participating in their care.  A copy of this report was sent to the requesting provider on this date.  Electronically Signed: Docia Barrier 11/09/2016, 1:03 PM   I spent a total of  15 Minutes in face to face in clinical consultation, greater than 50% of which was counseling/coordinating care for metastatic breast cancer with recurrent ascites.

## 2016-11-09 NOTE — Procedures (Signed)
Interventional Radiology Procedure Note  Procedure: Placement of a right abdominal tunneled peritoneal drainage catheter.  Tip is positioned inferiorly in the abd/pelvis and is ready for immediate use.  Complications: None Recommendations:  - Ok to use catheter. - Do not submerge for 7 days - Routine wound care   Signed,  Dulcy Fanny. Earleen Newport, DO

## 2016-11-09 NOTE — Discharge Instructions (Signed)
Moderate Conscious Sedation, Adult, Care After These instructions provide you with information about caring for yourself after your procedure. Your health care provider may also give you more specific instructions. Your treatment has been planned according to current medical practices, but problems sometimes occur. Call your health care provider if you have any problems or questions after your procedure. What can I expect after the procedure? After your procedure, it is common:  To feel sleepy for several hours.  To feel clumsy and have poor balance for several hours.  To have poor judgment for several hours.  To vomit if you eat too soon. Follow these instructions at home: For at least 24 hours after the procedure:   Do not:  Participate in activities where you could fall or become injured.  Drive.  Use heavy machinery.  Drink alcohol.  Take sleeping pills or medicines that cause drowsiness.  Make important decisions or sign legal documents.  Take care of children on your own.  Rest. Eating and drinking  Follow the diet recommended by your health care provider.  If you vomit:  Drink water, juice, or soup when you can drink without vomiting.  Make sure you have little or no nausea before eating solid foods. General instructions  Have a responsible adult stay with you until you are awake and alert.  Take over-the-counter and prescription medicines only as told by your health care provider.  If you smoke, do not smoke without supervision.  Keep all follow-up visits as told by your health care provider. This is important. Contact a health care provider if:  You keep feeling nauseous or you keep vomiting.  You feel light-headed.  You develop a rash.  You have a fever. Get help right away if:  You have trouble breathing. This information is not intended to replace advice given to you by your health care provider. Make sure you discuss any questions you have  with your health care provider. Document Released: 09/03/2013 Document Revised: 04/17/2016 Document Reviewed: 03/04/2016 Elsevier Interactive Patient Education  2017 [place] Catheter Insertion, Care After Refer to this sheet in the next few weeks. These instructions provide you with information about caring for yourself after your procedure. Your health care provider may also give you more specific instructions. Your treatment has been planned according to current medical practices, but problems sometimes occur. Call your health care provider if you have any problems or questions after your procedure. What can I expect after the procedure? After the procedure, it is common to have:  Some mild redness, swelling, and pain around your catheter site.  A small amount of blood or clear fluid coming from your incisions. Follow these instructions at home: Incision care  Check your incision areas every day for signs of infection. Check for:  More redness, swelling, or pain.  More fluid or blood.  Warmth.  Pus or a bad smell.  Follow instructions from your health care provider about how to take care of your incisions. Make sure you:  Wash your hands with soap and water before you change your bandages (dressings). If soap and water are not available, use hand sanitizer.  Change your dressings as told by your health care provider. Wash the area around your incisions with a germ-killing (antiseptic) solution when you change your dressing, as told by your health care provider.  Leave stitches (sutures), skin glue, or adhesive strips in place. These skin closures may need to stay in place for 2 weeks or  longer. If adhesive strip edges start to loosen and curl up, you may trim the loose edges. Do not remove adhesive strips completely unless your health care provider tells you to do that. Catheter Care   Wash your hands with soap and water before and after caring for your  catheter. If soap and water are not available, use hand sanitizer.  Keep your catheter site and your dressings clean and dry.  Apply an antibiotic ointment to your catheter site as told by your health care provider.  Flush your catheter as told by your health care provider. This helps prevent it from becoming clogged.  Do not open the caps on the ends of the catheter.  Do not pull on your catheter.  If your catheter is in your arm:  Avoid wearing tight clothes or tight jewelry on your arm that has the catheter.  Do not sleep with your head on the arm that has the catheter.  Do not allow your blood pressure to be taken on the arm that has the catheter.  Do not allow your blood to be drawn from the arm that has the catheter, except through the catheter itself. Medicines  Take over-the-counter and prescription medicines only as told by your health care provider.  If you were prescribed an antibiotic medicine, take it as told by your health care provider. Do not stop taking the antibiotic even if you start to feel better. Activity  Return to your normal activities as told by your health care provider. Ask your health care provider what activities are safe for you.  Do not lift anything that is heavier than 10 lb (4.5 kg) for 3 weeks or as long as told by your health care provider. Driving  Do not drive until your health care provider approves.  Do not drive or operate heavy machinery while taking prescription pain medicine. General instructions  Follow your health care provider's specific instructions for the type of catheter that you have.  Do not take baths, swim, or use a hot tub until your health care provider approves.  Follow instructions from your health care provider about eating or drinking restrictions.  Wear compression stockings as told by your health care provider. These stockings help to prevent blood clots and reduce swelling in your legs.  Keep all follow-up  visits as told by your health care provider. This is important. Contact a health care provider if:  You have more fluid or blood coming from your incisions.  You have more redness, swelling, or pain at your incisions or around the area where your catheter is inserted.  Your incisions feel warm to the touch.  You feel unusually weak.  You feel nauseous.  Your catheter is not working properly.  You have blood or fluid draining from your catheter.  You are unable to flush your catheter. Get help right away if:  Your catheter breaks.  A hole develops in your catheter.  Your catheter comes loose or gets pulled completely out. If this happens, press on your catheter site firmly with your hand or a clean cloth until you get medical help.  Your catheter becomes blocked.  You have swelling in your arm, shoulder, neck, or face.  You develop chest pain.  You have difficulty breathing.  You feel dizzy or light-headed.  You have pus or a bad smell coming from your incisions.  You have a fever.  You develop bleeding from your catheter or your insertion site, and your bleeding does not  stop. This information is not intended to replace advice given to you by your health care provider. Make sure you discuss any questions you have with your health care provider. Document Released: 10/30/2012 Document Revised: 07/16/2016 Document Reviewed: 08/09/2015 Elsevier Interactive Patient Education  2017 [employer].

## 2016-11-10 ENCOUNTER — Emergency Department
Admission: EM | Admit: 2016-11-10 | Discharge: 2016-11-10 | Disposition: A | Discharge status: Home / Self Care | Payer: Medicare Other | Attending: Emergency Medicine | Admitting: Emergency Medicine

## 2016-11-10 ENCOUNTER — Telehealth: Payer: Self-pay | Admitting: *Deleted

## 2016-11-10 ENCOUNTER — Encounter: Payer: Self-pay | Admitting: Emergency Medicine

## 2016-11-10 DIAGNOSIS — Z853 Personal history of malignant neoplasm of breast: Secondary | ICD-10-CM | POA: Insufficient documentation

## 2016-11-10 DIAGNOSIS — Y828 Other medical devices associated with adverse incidents: Secondary | ICD-10-CM | POA: Diagnosis not present

## 2016-11-10 DIAGNOSIS — I10 Essential (primary) hypertension: Secondary | ICD-10-CM | POA: Insufficient documentation

## 2016-11-10 DIAGNOSIS — Z79899 Other long term (current) drug therapy: Secondary | ICD-10-CM | POA: Diagnosis not present

## 2016-11-10 DIAGNOSIS — T85691A Other mechanical complication of intraperitoneal dialysis catheter, initial encounter: Secondary | ICD-10-CM | POA: Diagnosis present

## 2016-11-10 NOTE — Discharge Instructions (Signed)
Please follow-up with your doctor on Monday for re-check/reevaluation. Return to the emergency department for any further catheter issues. Return to the emergency department for any increased abdominal pain or fever.

## 2016-11-10 NOTE — ED Notes (Signed)
Pt lying in bed now, catheter was clamped, Dr. Kerman Passey placed new tubing from where it was cut. No longer draining at this point. Pleurex draining kit called for by Gaspar Bidding, RN and [facility] bringing it to ED for Dr. Kerman Passey.   Family given shoes and socks per request. Placed in bag by Lenny Pastel, ED medic since they are soaked.

## 2016-11-10 NOTE — Telephone Encounter (Signed)
This RN was contacted by [facility] patient care manager - Sharee Pimple stating pt has declined home health services. Per this RN's request Sharee Pimple contacted pt's mother and was informed pt and family do not want services in the home at this time.  This RN contacted [name], pt's mother, and discussed understanding of home care with pleurX catheter - per drainage and catheter insertion/dressing site.  [name] states per visit yesterday at [facility] with insertion - care was discussed as well as video watched by her and pt's daughter, [name], at " we are comfortable with all the care needed in the home "  Supplies were also given to pt for home care.  Per above- [name] verbalized understanding of home care needed as well as to contact this office if needed, including if family feels home health would be beneficial.  No other needs at this time.

## 2016-11-10 NOTE — ED Triage Notes (Signed)
Pt comes into the ED via POV c/o catheter problems after cutting the catheter.  Patient has pleurex catheter placed to help with paracentesis.  Patient had paracentesis yesterday where 8.5L was drained.  Patient states she was trying to cut her underwear off when she accidentally cut the catheter.  Patient in NAD at this time.  H/o of metastatic breast cancer.  Patient c/o uterine and abdominal pain.

## 2016-11-10 NOTE — Telephone Encounter (Signed)
No entry 

## 2016-11-10 NOTE — ED Notes (Signed)
Pt given ice chips and blue scrub pants to wear out.

## 2016-11-10 NOTE — ED Provider Notes (Signed)
[facility] Emergency Department Provider Note  Time seen: 9:20 PM  I have reviewed the triage vital signs and the nursing notes.   HISTORY  Chief Complaint catheter problem    HPI [name] [name] is a 55 y.o. female with a past medical history of metastatic cancer with ascites accumulation. Patient had a Pleurx peritoneal catheter placed several days ago. It is only used as needed, has only been used once since its placement. Family states the patient was attempting to cut her underwear to make a slit for where the catheter sits, however the patient accidentally cut the catheter. Patient denies any increased abdominal pain. Denies fever. Family denies as well. Patient does have chronic pain but she states no worse than baseline. Pleurx catheter was leaking so they came to the emergency department for evaluation.  Past Medical History:  Diagnosis Date  . Anxiety   . Breast cancer (HCC) 03/27/13   right  . Depression   . Headache(784.0)   . Hx of radiation therapy 10/02/13- 11/21/13   right chest wall, right supraclav/axillary region 5040 cGy 28 sessions, right chest wall mastectomy scar boost 1000 cGy 5 sessions  . Hypertension   . Shortness of breath   . Sleep apnea    cpcp    Patient Active Problem List   Diagnosis Date Noted  . Dehydration 10/17/2016  . Diarrhea 10/17/2016  . Ascites 10/17/2016  . Hypoalbuminemia due to protein-calorie malnutrition (HCC) 10/17/2016  . 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 Hydroureter, right 05/10/2016  . 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 Morbid obesity with BMI of 40.0-44.9, adult (HCC) 05/03/2016  . Port catheter in place 04/10/2016  . Genetic testing 08/27/2015  . Elevated serum creatinine  02/03/2014  . Elevated glucose 02/03/2014  . History of breast cancer 02/03/2014  . Drainage from ear, left 02/03/2014  . Breast cancer of upper-outer quadrant of right female breast (HCC) 07/29/2013  . Anemia, unspecified 07/02/2013  . Rectal fissure 05/28/2013  . Allergic reaction 04/17/2013  . Leucocytosis 04/17/2013  . ARF (acute renal failure) (HCC) 04/17/2013  . HTN (hypertension) 04/17/2013  . Obstructive sleep apnea 11/12/2011    Past Surgical History:  Procedure Laterality Date  . IR GENERIC HISTORICAL  11/09/2016   IR PERC TUN PERIT CATH WO PORT S&I Judi Cong 11/09/2016 Gilmer Mor, DO [facility]-INTERV RAD  . MASTECTOMY MODIFIED RADICAL Right 04/07/2013   Procedure: RIGHT MASTECTOMY MODIFIED RADICAL;  Surgeon: Kandis Cocking, MD;  Location: [facility] OR;  Service: General;  Laterality: Right;  . MASTECTOMY, RADICAL Right 04/08/2013  . PORTACATH PLACEMENT Left 04/07/2013   Procedure: INSERTION PORT-A-CATH;  Surgeon: Kandis Cocking, MD;  Location: [facility] OR;  Service: General;  Laterality: Left;  . SIMPLE MASTECTOMY WITH AXILLARY SENTINEL NODE BIOPSY Left 04/08/2013  . SIMPLE MASTECTOMY WITH AXILLARY SENTINEL NODE BIOPSY Left 04/07/2013   Procedure:  LEFT SMPLE MASTECTOMY WITH AXILLARY SENTINEL NODE BIOPSY;  Surgeon: Kandis Cocking, MD;  Location: [facility] OR;  Service: General;  Laterality: Left;    Prior to Admission medications   Medication Sig Start Date End Date Taking? Authorizing Provider  acyclovir (ZOVIRAX) 400 MG tablet Take 1 tablet (400 mg total) by mouth 2 (two) times daily. 01/18/16   Sheffield Slider, NP  ALPRAZolam Prudy Feeler) 0.5 MG tablet Take 1  tablet (0.5 mg total) by mouth 3 (three) times daily as needed for anxiety. 07/17/16   Chauncey Cruel, MD  anastrozole (ARIMIDEX) 1 MG tablet Take 1 tablet (1 mg total) by mouth daily. 10/09/16   Chauncey Cruel, MD  aspirin 81 MG tablet Take 81 mg by mouth daily.    Historical Provider, MD  diltiazem 2 % GEL Apply 1 application topically 2 (two)  times daily. Patient taking differently: Apply 1 application topically as needed.  05/28/13   Amy Milda Smart, PA-C  escitalopram (LEXAPRO) 20 MG tablet Take 1 tablet (20 mg total) by mouth daily. 07/17/16   Chauncey Cruel, MD  lidocaine-prilocaine (EMLA) cream Apply 1 application topically as needed. 09/12/16   Chauncey Cruel, MD  meloxicam (MOBIC) 15 MG tablet Take 1 tablet (15 mg total) by mouth daily. 10/09/16   Chauncey Cruel, MD  metoprolol succinate (TOPROL-XL) 25 MG 24 hr tablet Take 25 mg by mouth daily.    Historical Provider, MD  miconazole (MICOTIN) 2 % powder Apply topically as needed for itching. Reported on 01/18/2016    Historical Provider, MD  palbociclib Leslee Home) 125 MG capsule Take 1 capsule (125 mg total) by mouth daily with breakfast. Take whole with food. 10/26/16   Chauncey Cruel, MD  prochlorperazine (COMPAZINE) 10 MG tablet Take 1 tablet (10 mg total) by mouth every 6 (six) hours as needed for nausea or vomiting. Can cause drowsiness. 09/21/16   Chauncey Cruel, MD  traMADol (ULTRAM) 50 MG tablet Take 1-2 tablets (50-100 mg total) by mouth every 6 (six) hours as needed. 11/07/16   Chauncey Cruel, MD  triamterene-hydrochlorothiazide (MAXZIDE-25) 37.5-25 MG per tablet Take 1 tablet by mouth daily.    Historical Provider, MD    Allergies  Allergen Reactions  . Vicodin [Hydrocodone-Acetaminophen] Anaphylaxis  . Codeine Nausea And Vomiting  . Penicillins Rash  . Percocet [Oxycodone-Acetaminophen] Itching  . Septra [Bactrim] Other (See Comments)    Heart races    Family History  Problem Relation Age of Onset  . Breast cancer Mother 49  . Breast cancer Maternal Grandmother 63  . Prostate cancer Paternal Grandfather 32    Social History Social History  Substance Use Topics  . Smoking status: Never Smoker  . Smokeless tobacco: Never Used  . Alcohol use No    Review of Systems Constitutional: Negative for fever Cardiovascular: Negative for chest  pain. Respiratory: Negative for shortness of breath. Gastrointestinal: Mild abdominal pain, baseline. Moderate rectal pain, baseline. Musculoskeletal: Negative for back pain. Neurological: Negative for headache 10-point ROS otherwise negative.  ____________________________________________   PHYSICAL EXAM:  Constitutional: Alert. Well appearing and in no distress. Eyes: Normal exam ENT   Head: Normocephalic and atraumatic.   Mouth/Throat: Mucous membranes are moist. Cardiovascular: Normal rate, regular rhythm.  Respiratory: Normal respiratory effort without tachypnea nor retractions. Breath sounds are clear  Gastrointestinal: Soft, no significant tenderness to palpation, no rebound or guarding. Patient has a Pleurx hurting a catheter in the right lower quadrant of her abdomen. The tubing is cut approximately 1 inch proximal to the port, and leaking fluid. Musculoskeletal: Nontender with normal range of motion in all extremities.  Neurologic:  Normal speech and language. No gross focal neurologic deficits Skin:  Skin is warm, dry and intact.  Psychiatric: Mood and affect are normal.   ____________________________________________   INITIAL IMPRESSION / ASSESSMENT AND PLAN / ED COURSE  Pertinent labs & imaging results that were available during my care of the  patient were reviewed by me and considered in my medical decision making (see chart for details).  Patient presents for leaking Pleurx catheter after accidentally cutting the tubing. Patient accidentally cut the tubing approximately 1 inch proximal to the port. The tubing is lacerated but not completely cut through. I was able to cut the tubing approximately 2 inches proximal to this area and reattach the port. The port and tubing were all cleaned with alcohol prior to reattachment. We will use a Pleurx drain kit to attempt to drain further pleural fluid to flush the tubing to ensure that no fluid that was possibly exposed to  air is able to reenter the abdominal cavity. Patient has no increased abdominal pain. No fever.  I was able to use a Pleurx drink it and sterilely drained 250 cc of fluid. Drain has been redressed the area patient will be discharged home. Discussed return precautions for fever or worsening abdominal pain.  ____________________________________________   FINAL CLINICAL IMPRESSION(S) / ED DIAGNOSES  Peritoneal drain complication    Harvest Dark, MD 11/10/16 2236

## 2016-11-10 NOTE — ED Notes (Addendum)
Dr. Kerman Passey at bedside with pleurex drainage kit. Took 231m of fluid out.

## 2016-11-13 ENCOUNTER — Telehealth: Payer: Self-pay | Admitting: *Deleted

## 2016-11-13 NOTE — Telephone Encounter (Signed)
I was called by a the hospital administrator in charge of admissions at the living facility for hospice in [place]. They tell me unless Attalia has a prognosis of 2 weeks or less she will not qualify. I really don't think she is that near death. I think weeks or months are more likely in her situation. I strongly urged them nevertheless to make a visit to the home if Adelinne Pineau problem so she can enroll in hospice services.

## 2016-11-13 NOTE — Telephone Encounter (Signed)
This  RN returned call to pt's mother - [name] [name] - who called earlier stating " we need Hospice "  Per call - [name] [name] states pt has deteriorated over the weekend and " Christy cannot handle her care on her own ".  " We would like for her to be admitted the Hospice inpatient facility - but we would like to drive her and her not go by ambulance "  Note above was discussed at last office visit but declined due to family " not wanting care in the home due to 8 poodles in the home and concerns with visitors. Pt and DTR ( who has HC POA ) live the [place] home.  Goal was for pt and daughter to obtain their own apartment after the first of the year that would be conducive for home care.  Per above this RN contacted [facility] and inquired if pt could be admitted to the Hospice inpt facility by phone contact due to family reluctance for home visit.  Records faxed and per contact with family above will be reviewed for possible admittance directly to facility. Hospice nurse will contact this RN including if unable to provide requested services for possible need for pt to be admitted to hospital for care then transferred to local [employer] facility.  Records faxed to above.  Will follow up post Hospice contacting family.

## 2016-11-14 ENCOUNTER — Telehealth: Payer: Self-pay | Admitting: *Deleted

## 2016-11-14 NOTE — Telephone Encounter (Signed)
"  This is Christophe Louis RN with Hospice.  We received referral for this patient we saw today to admit to services.  She is on three chemotherapy agents that we cannot admit.  Anastrozole, Fulvestrant and Palbociclib cannot be covered by Hospice.  She also has supplies for a PleuRx drain.  What do we need to do with these supplies?  It does not need to be drained at this time."    Verbal order received and read back from Dr. Jana Hakim for these three medicines to be discontinued.  Order given to [place] at this time.   Hospice will see patient twice weekly and as needed.  Advised drain be emptied twice weekly and as needed.   Sonia Baller asked that I "notify Dr. Jana Hakim patient will be admitted to Hospice today."  Hospice will call [facility] with any further questions or concerns.

## 2016-11-15 ENCOUNTER — Telehealth: Payer: Self-pay | Admitting: Emergency Medicine

## 2016-11-15 NOTE — Telephone Encounter (Signed)
Patient's daughter called with concerns about [name] [name] and them needing assistance with her care. Per [name], they would like to have [place] admitted to a skilled nursing facility or a hospice home. She states that they are having a hard time caring for her in the home by themselves.  Discussed with [name] and [name] that they need to have the hospice nurse come out and evaluate [name] so that we can better assist them  After further discussion, [name] agreed to having the hospice RN come to evaluate [name].   Vickie, the hospice nurse, is scheduled to visit [name] in the home today at 1200; her cell number is [phone]  [place] both know to contact this office for any further questions or concerns.

## 2016-11-15 NOTE — Telephone Encounter (Signed)
Message received by [facility] hospice RN that patient has been admitted to the hospice home for symptom management.  Per Vickie, patient appears to be impacted, no bowel sounds present per Vickie RN,  only having small amounts of stool, was given tramadol upon her assessment for pain (noted to be the only pain medications in the home) also patient complaining of a rectal tear which they feel will require pain management as well.

## 2016-11-19 ENCOUNTER — Other Ambulatory Visit
Admission: RE | Admit: 2016-11-19 | Discharge: 2016-11-19 | Disposition: A | Discharge status: Home / Self Care | Source: Ambulatory Visit | Attending: Family Medicine | Admitting: Family Medicine

## 2016-11-19 DIAGNOSIS — R300 Dysuria: Secondary | ICD-10-CM | POA: Insufficient documentation

## 2016-11-19 LAB — URINALYSIS, COMPLETE (UACMP) WITH MICROSCOPIC
Bacteria, UA: NONE SEEN
Bilirubin Urine: NEGATIVE
GLUCOSE, UA: NEGATIVE mg/dL
Hgb urine dipstick: NEGATIVE
Ketones, ur: NEGATIVE mg/dL
Nitrite: NEGATIVE
PH: 6 (ref 5.0–8.0)
Protein, ur: NEGATIVE mg/dL
RBC / HPF: NONE SEEN RBC/hpf (ref 0–5)
Specific Gravity, Urine: 1.014 (ref 1.005–1.030)
Squamous Epithelial / LPF: NONE SEEN

## 2016-11-21 LAB — URINE CULTURE: CULTURE: NO GROWTH

## 2016-12-05 ENCOUNTER — Ambulatory Visit: Payer: Medicare Other | Admitting: Oncology

## 2016-12-05 ENCOUNTER — Ambulatory Visit: Payer: Medicare Other

## 2016-12-05 ENCOUNTER — Telehealth: Payer: Self-pay | Admitting: Pharmacist

## 2016-12-05 ENCOUNTER — Other Ambulatory Visit: Payer: Medicare Other

## 2016-12-05 NOTE — Telephone Encounter (Signed)
Oral Chemotherapy Pharmacist Encounter  Oral Oncology Clinic contacted [place] Patient Assistance program ([employer]) for update on re-enrollment application sent by Annamary Rummage on 10/25/16. [employer] stated to not have application and we should re-send it. Application retrieved from [employer] and faxed to [employer]. Received confirmation from [place] that they have received application and will follow-up with our office within 2 business days.  [place] phone number to call with questions: [phone] Subject#: [id number]  Oral Oncology Clinic and financial advocates will continue to follow.  Johny Drilling, PharmD, BCPS, BCOP 12/05/2016  3:04 PM Oral Oncology Clinic [phone]

## 2016-12-12 NOTE — Telephone Encounter (Signed)
Oral Chemotherapy Pharmacist Encounter  Received notification that patient has gone off of Ibrance therapy and is under hospice care. Application for [employer] from [employer] cancelled.  Oral Oncology Clinic will sign off at this time. Please let us know if we can be of assistance in the future.  Johny Drilling, PharmD, BCPS, BCOP 12/12/2016  9:41 AM Oral Oncology Clinic [phone]

## 2016-12-15 ENCOUNTER — Telehealth: Payer: Self-pay | Admitting: *Deleted

## 2016-12-15 NOTE — Telephone Encounter (Signed)
Message left on RN VM stating pt passed this AM at the [facility] in [facility].

## 2016-12-26 ENCOUNTER — Other Ambulatory Visit: Payer: Self-pay | Admitting: Oncology

## 2016-12-28 DEATH — deceased

## 2017-01-12 ENCOUNTER — Encounter (HOSPITAL_COMMUNITY): Payer: Self-pay
